# Patient Record
Sex: Male | Born: 1950 | Race: White | Hispanic: No | Marital: Married | Smoking: Former smoker
Health system: Southern US, Community
[De-identification: ages and names within clinical notes are randomized; demographics above are authoritative.]

## PROBLEM LIST (undated history)

## (undated) DIAGNOSIS — F419 Anxiety disorder, unspecified: Secondary | ICD-10-CM

## (undated) DIAGNOSIS — I739 Peripheral vascular disease, unspecified: Secondary | ICD-10-CM

## (undated) DIAGNOSIS — I10 Essential (primary) hypertension: Secondary | ICD-10-CM

## (undated) DIAGNOSIS — E785 Hyperlipidemia, unspecified: Secondary | ICD-10-CM

## (undated) DIAGNOSIS — G629 Polyneuropathy, unspecified: Secondary | ICD-10-CM

## (undated) DIAGNOSIS — K429 Umbilical hernia without obstruction or gangrene: Secondary | ICD-10-CM

## (undated) DIAGNOSIS — M1990 Unspecified osteoarthritis, unspecified site: Secondary | ICD-10-CM

## (undated) DIAGNOSIS — C61 Malignant neoplasm of prostate: Secondary | ICD-10-CM

## (undated) DIAGNOSIS — R51 Headache: Secondary | ICD-10-CM

## (undated) DIAGNOSIS — D8689 Sarcoidosis of other sites: Secondary | ICD-10-CM

## (undated) DIAGNOSIS — K5790 Diverticulosis of intestine, part unspecified, without perforation or abscess without bleeding: Secondary | ICD-10-CM

## (undated) DIAGNOSIS — R519 Headache, unspecified: Secondary | ICD-10-CM

## (undated) DIAGNOSIS — C4490 Unspecified malignant neoplasm of skin, unspecified: Secondary | ICD-10-CM

## (undated) HISTORY — PX: APPENDECTOMY: SHX54

## (undated) HISTORY — PX: KNEE CARTILAGE SURGERY: SHX688

## (undated) HISTORY — PX: CATARACT EXTRACTION: SUR2

## (undated) HISTORY — DX: Polyneuropathy, unspecified: G62.9

## (undated) HISTORY — DX: Hyperlipidemia, unspecified: E78.5

---

## 2003-05-11 ENCOUNTER — Emergency Department (HOSPITAL_COMMUNITY): Admission: EM | Admit: 2003-05-11 | Discharge: 2003-05-11 | Payer: Self-pay | Admitting: Emergency Medicine

## 2003-09-01 ENCOUNTER — Ambulatory Visit (HOSPITAL_COMMUNITY): Admission: RE | Admit: 2003-09-01 | Discharge: 2003-09-01 | Payer: Self-pay | Admitting: Gastroenterology

## 2010-03-14 DIAGNOSIS — R200 Anesthesia of skin: Secondary | ICD-10-CM | POA: Insufficient documentation

## 2010-03-14 DIAGNOSIS — E785 Hyperlipidemia, unspecified: Secondary | ICD-10-CM | POA: Insufficient documentation

## 2010-03-14 DIAGNOSIS — R4586 Emotional lability: Secondary | ICD-10-CM | POA: Insufficient documentation

## 2010-03-14 DIAGNOSIS — Z09 Encounter for follow-up examination after completed treatment for conditions other than malignant neoplasm: Secondary | ICD-10-CM | POA: Insufficient documentation

## 2010-03-14 DIAGNOSIS — M25569 Pain in unspecified knee: Secondary | ICD-10-CM | POA: Insufficient documentation

## 2010-06-14 DIAGNOSIS — E1149 Type 2 diabetes mellitus with other diabetic neurological complication: Secondary | ICD-10-CM | POA: Insufficient documentation

## 2010-06-14 DIAGNOSIS — M545 Low back pain, unspecified: Secondary | ICD-10-CM | POA: Insufficient documentation

## 2010-06-14 DIAGNOSIS — I10 Essential (primary) hypertension: Secondary | ICD-10-CM | POA: Insufficient documentation

## 2010-06-14 DIAGNOSIS — G8929 Other chronic pain: Secondary | ICD-10-CM | POA: Insufficient documentation

## 2010-10-15 DIAGNOSIS — E669 Obesity, unspecified: Secondary | ICD-10-CM | POA: Insufficient documentation

## 2011-01-10 NOTE — H&P (Signed)
NAMECEDERIC, MOZLEY NO.:  [id number]  MEDICAL RECORD NO.:  [id number]  LOCATION:  PERIO                        FACILITY:  [facility]  PHYSICIAN:  Georges Lynch. Gioffre, M.D.DATE OF BIRTH:  09-02-1950  DATE OF ADMISSION:  11/28/2010 DATE OF DISCHARGE:                             HISTORY & PHYSICAL   CHIEF COMPLAINT:  Right knee pain.  HISTORY OF PRESENT ILLNESS:  The patient is a 60 year old male with worsening right knee pain secondary to end-stage osteoarthritis.  The patient elected to have a right total knee arthroplasty by Dr. Ranee Gosselin to decrease pain and increase function.  PAST MEDICAL HISTORY: 1. Hypertension. 2. Diabetes. 3. Osteoarthritis.  FAMILY MEDICAL HISTORY:  Negative.  SOCIAL HISTORY:  Patient of Dr. Jarold Motto.  Does drink alcohol, but does not smoke.  DRUG ALLERGIES:  None.  CURRENT MEDICATIONS: 1. Glimepiride 2 mg daily. 2. Celebrex 200 mg daily. 3. Lisinopril 5 mg daily. 4. Januvia 100 mg daily. 5. Propranolol 10 mg b.i.d. 6. Gabapentin 300 mg b.i.d. 7. Mobic 15 mg daily. 8. Metformin HCL 500 mg daily. 9. B12 vitamin 1000 mg daily. 10.Vitamin C. 11.Centrum Silver. 12.Multivitamins daily. 13.Aspirin 81 mg daily.  REVIEW OF SYSTEMS:  Showed pain range of motion of bilateral knees, right greater than left.  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  PHYSICAL EXAMINATION:  VITAL SIGNS:  Pulse 74, respirations 16, blood pressure 138/86. GENERAL:  The patient is a healthy-appearing 60 year old male.  No acute distress.  Pleasant mood and affect, alert and oriented x3. HEAD AND NECK:  Shows cranial nerves II through XII grossly intact. Neck shows full range of motion without any  tenderness. CHEST:  Active breath sounds bilaterally.  No wheezes, rhonchi, or rales. HEART:  Regular rate and rhythm.  No murmur. ABDOMEN:  Nontender, nondistended. EXTREMITIES:  Moderate tenderness of bilateral knees, moderate  crepitus in medial joint line.  No rashes.  No edema.  Does have a normal heel- toe gait.  Decreased sensation at the soles of each feet.  X-rays show end-stage osteoarthritis of his right knee.  IMPRESSION:  End-stage osteoarthritis, right knee.  PLAN OF ACTION:  Right total knee arthroplasty by Dr. Ranee Gosselin.     Augustine Brannick B. Merin Borjon, P.A.   ______________________________ Georges Lynch Darrelyn Hillock, M.D.    TBD/MEDQ  D:  01/10/2011  T:  01/10/2011  Job:  [id number]

## 2011-01-16 ENCOUNTER — Encounter (HOSPITAL_COMMUNITY): Payer: Self-pay | Admitting: Pharmacy Technician

## 2011-01-23 DIAGNOSIS — E1151 Type 2 diabetes mellitus with diabetic peripheral angiopathy without gangrene: Secondary | ICD-10-CM | POA: Insufficient documentation

## 2011-01-23 DIAGNOSIS — I739 Peripheral vascular disease, unspecified: Secondary | ICD-10-CM | POA: Insufficient documentation

## 2011-01-25 ENCOUNTER — Encounter (HOSPITAL_COMMUNITY): Payer: Self-pay

## 2011-01-25 ENCOUNTER — Ambulatory Visit (HOSPITAL_COMMUNITY)
Admission: RE | Admit: 2011-01-25 | Discharge: 2011-01-25 | Disposition: A | Discharge status: Home / Self Care | Source: Ambulatory Visit | Attending: Orthopedic Surgery | Admitting: Orthopedic Surgery

## 2011-01-25 ENCOUNTER — Other Ambulatory Visit: Payer: Self-pay

## 2011-01-25 ENCOUNTER — Ambulatory Visit (INDEPENDENT_AMBULATORY_CARE_PROVIDER_SITE_OTHER): Admitting: *Deleted

## 2011-01-25 ENCOUNTER — Encounter (HOSPITAL_COMMUNITY)
Admission: RE | Admit: 2011-01-25 | Discharge: 2011-01-25 | Disposition: A | Discharge status: Home / Self Care | Source: Ambulatory Visit | Attending: Orthopedic Surgery | Admitting: Orthopedic Surgery

## 2011-01-25 DIAGNOSIS — Z01812 Encounter for preprocedural laboratory examination: Secondary | ICD-10-CM | POA: Insufficient documentation

## 2011-01-25 DIAGNOSIS — M1710 Unilateral primary osteoarthritis, unspecified knee: Secondary | ICD-10-CM | POA: Insufficient documentation

## 2011-01-25 DIAGNOSIS — I739 Peripheral vascular disease, unspecified: Secondary | ICD-10-CM

## 2011-01-25 DIAGNOSIS — Z01810 Encounter for preprocedural cardiovascular examination: Secondary | ICD-10-CM | POA: Insufficient documentation

## 2011-01-25 DIAGNOSIS — Z01818 Encounter for other preprocedural examination: Secondary | ICD-10-CM | POA: Insufficient documentation

## 2011-01-25 HISTORY — DX: Essential (primary) hypertension: I10

## 2011-01-25 HISTORY — DX: Unspecified osteoarthritis, unspecified site: M19.90

## 2011-01-25 HISTORY — DX: Anxiety disorder, unspecified: F41.9

## 2011-01-25 LAB — PROTIME-INR
INR: 1.03 (ref 0.00–1.49)
Prothrombin Time: 13.7 seconds (ref 11.6–15.2)

## 2011-01-25 LAB — CBC
HCT: 42.5 % (ref 36.0–46.0)
Hemoglobin: 15.1 g/dL — ABNORMAL HIGH (ref 12.0–15.0)
MCH: 34.5 pg — ABNORMAL HIGH (ref 26.0–34.0)
MCHC: 35.5 g/dL (ref 30.0–36.0)
MCV: 97 fL (ref 78.0–100.0)
Platelets: 197 10*3/uL (ref 150–400)
RBC: 4.38 MIL/uL (ref 3.87–5.11)
RDW: 13.2 % (ref 11.5–15.5)
WBC: 5.8 10*3/uL (ref 4.0–10.5)

## 2011-01-25 LAB — SURGICAL PCR SCREEN
MRSA, PCR: NEGATIVE
Staphylococcus aureus: NEGATIVE

## 2011-01-25 LAB — DIFFERENTIAL
Basophils Absolute: 0 10*3/uL (ref 0.0–0.1)
Basophils Relative: 1 % (ref 0–1)
Eosinophils Absolute: 0.1 10*3/uL (ref 0.0–0.7)
Eosinophils Relative: 2 % (ref 0–5)
Lymphocytes Relative: 26 % (ref 12–46)
Lymphs Abs: 1.5 10*3/uL (ref 0.7–4.0)
Monocytes Absolute: 0.7 10*3/uL (ref 0.1–1.0)
Monocytes Relative: 12 % (ref 3–12)
Neutro Abs: 3.4 10*3/uL (ref 1.7–7.7)
Neutrophils Relative %: 59 % (ref 43–77)

## 2011-01-25 LAB — URINALYSIS, ROUTINE W REFLEX MICROSCOPIC
Bilirubin Urine: NEGATIVE
Glucose, UA: NEGATIVE mg/dL
Hgb urine dipstick: NEGATIVE
Ketones, ur: NEGATIVE mg/dL
Leukocytes, UA: NEGATIVE
Nitrite: NEGATIVE
Protein, ur: NEGATIVE mg/dL
Specific Gravity, Urine: 1.026 (ref 1.005–1.030)
Urobilinogen, UA: 0.2 mg/dL (ref 0.0–1.0)
pH: 6 (ref 5.0–8.0)

## 2011-01-25 LAB — COMPREHENSIVE METABOLIC PANEL
ALT: 57 U/L — ABNORMAL HIGH (ref 0–35)
AST: 39 U/L — ABNORMAL HIGH (ref 0–37)
Albumin: 4 g/dL (ref 3.5–5.2)
Alkaline Phosphatase: 69 U/L (ref 39–117)
BUN: 19 mg/dL (ref 6–23)
CO2: 29 mEq/L (ref 19–32)
Calcium: 9.9 mg/dL (ref 8.4–10.5)
Chloride: 101 mEq/L (ref 96–112)
Creatinine, Ser: 0.84 mg/dL (ref 0.50–1.10)
GFR calc Af Amer: 86 mL/min — ABNORMAL LOW (ref >90)
GFR calc non Af Amer: 74 mL/min — ABNORMAL LOW (ref >90)
Glucose, Bld: 173 mg/dL — ABNORMAL HIGH (ref 70–99)
Potassium: 4.9 mEq/L (ref 3.5–5.1)
Sodium: 137 mEq/L (ref 135–145)
Total Bilirubin: 0.5 mg/dL (ref 0.3–1.2)
Total Protein: 7.8 g/dL (ref 6.0–8.3)

## 2011-01-25 LAB — APTT: aPTT: 35 seconds (ref 24–37)

## 2011-01-25 NOTE — Patient Instructions (Signed)
20 Raymond Herring  01/25/2011   Your procedure is scheduled on: Wed. 01/30/2011  Report to [facility] at 0630 AM.  Call this number if you have problems the morning of surgery: [phone]   Remember:   Do not eat food:After Midnight.  May have clear liquids:until Midnight .  Clear liquids include soda, tea, black coffee, apple or grape juice, broth.  Take these medicines the morning of surgery with A SIP OF WATER: Neurontin,Inderal   Do not wear jewelry, make-up or nail polish.  Do not wear lotions, powders, or perfumes. (women only)  Do not shave 48 hours prior to surgery.  Do not bring valuables to the hospital.  Contacts, dentures or bridgework may not be worn into surgery.  Leave suitcase in the car. After surgery it may be brought to your room.  For patients admitted to the hospital, checkout time is 11:00 AM the day of discharge.   Patients discharged the day of surgery will not be allowed to drive home.  Name and phone number of your driver:   Special Instructions: CHG Shower Use Special Wash: 1/2 bottle night before surgery and 1/2 bottle morning of surgery.   Please read over the following fact sheets that you were given: MRSA Information

## 2011-01-30 ENCOUNTER — Inpatient Hospital Stay (HOSPITAL_COMMUNITY)
Admission: RE | Admit: 2011-01-30 | Discharge: 2011-02-03 | DRG: 470 | Disposition: A | Discharge status: Home Health | Source: Ambulatory Visit | Attending: Orthopedic Surgery | Admitting: Orthopedic Surgery

## 2011-01-30 ENCOUNTER — Encounter (HOSPITAL_COMMUNITY): Payer: Self-pay

## 2011-01-30 ENCOUNTER — Inpatient Hospital Stay (HOSPITAL_COMMUNITY)

## 2011-01-30 ENCOUNTER — Encounter (HOSPITAL_COMMUNITY): Payer: Self-pay | Admitting: Anesthesiology

## 2011-01-30 ENCOUNTER — Encounter (HOSPITAL_COMMUNITY)
Admission: RE | Disposition: A | Discharge status: Home Health | Payer: Self-pay | Source: Ambulatory Visit | Attending: Orthopedic Surgery

## 2011-01-30 ENCOUNTER — Inpatient Hospital Stay (HOSPITAL_COMMUNITY): Admitting: Anesthesiology

## 2011-01-30 DIAGNOSIS — D649 Anemia, unspecified: Secondary | ICD-10-CM | POA: Diagnosis not present

## 2011-01-30 DIAGNOSIS — F411 Generalized anxiety disorder: Secondary | ICD-10-CM | POA: Diagnosis present

## 2011-01-30 DIAGNOSIS — M1711 Unilateral primary osteoarthritis, right knee: Secondary | ICD-10-CM | POA: Diagnosis present

## 2011-01-30 DIAGNOSIS — I10 Essential (primary) hypertension: Secondary | ICD-10-CM | POA: Diagnosis present

## 2011-01-30 DIAGNOSIS — E119 Type 2 diabetes mellitus without complications: Secondary | ICD-10-CM | POA: Diagnosis present

## 2011-01-30 DIAGNOSIS — M179 Osteoarthritis of knee, unspecified: Secondary | ICD-10-CM | POA: Diagnosis present

## 2011-01-30 DIAGNOSIS — M1710 Unilateral primary osteoarthritis, unspecified knee: Principal | ICD-10-CM | POA: Diagnosis present

## 2011-01-30 DIAGNOSIS — M24569 Contracture, unspecified knee: Secondary | ICD-10-CM | POA: Diagnosis present

## 2011-01-30 HISTORY — PX: TOTAL KNEE ARTHROPLASTY: SHX125

## 2011-01-30 LAB — TYPE AND SCREEN
ABO/RH(D): B POS
Antibody Screen: NEGATIVE
Unit division: 0
Unit division: 0

## 2011-01-30 LAB — GLUCOSE, CAPILLARY
Glucose-Capillary: 163 mg/dL — ABNORMAL HIGH (ref 70–99)
Glucose-Capillary: 123 mg/dL — ABNORMAL HIGH (ref 70–99)
Glucose-Capillary: 174 mg/dL — ABNORMAL HIGH (ref 70–99)
Glucose-Capillary: 184 mg/dL — ABNORMAL HIGH (ref 70–99)

## 2011-01-30 LAB — ABO/RH: ABO/RH(D): B POS

## 2011-01-30 SURGERY — ARTHROPLASTY, KNEE, TOTAL
Anesthesia: General | Site: Knee | Laterality: Right | Wound class: Clean

## 2011-01-30 MED ORDER — RIVAROXABAN 10 MG PO TABS
10.0000 mg | ORAL_TABLET | Freq: Every day | ORAL | Status: DC
  Administered 2011-01-31 – 2011-02-03 (×4): 10 mg via ORAL
  Filled 2011-01-30 (×4): qty 1

## 2011-01-30 MED ORDER — MENTHOL 3 MG MT LOZG: 1.0000 | LOZENGE | OROMUCOSAL | Status: DC | PRN

## 2011-01-30 MED ORDER — PROPRANOLOL HCL 10 MG PO TABS
10.0000 mg | ORAL_TABLET | Freq: Two times a day (BID) | ORAL | Status: DC
Start: 2011-01-30 — End: 2011-02-03
  Administered 2011-01-30 – 2011-02-03 (×8): 10 mg via ORAL
  Filled 2011-01-30 (×9): qty 1

## 2011-01-30 MED ORDER — HYDROMORPHONE HCL PF 1 MG/ML IJ SOLN
INTRAMUSCULAR | Status: AC
  Administered 2011-01-30: 1 mg via INTRAVENOUS
  Filled 2011-01-30: qty 1

## 2011-01-30 MED ORDER — METFORMIN HCL ER 500 MG PO TB24
500.0000 mg | ORAL_TABLET | Freq: Every day | ORAL | Status: DC
  Administered 2011-01-30 – 2011-02-02 (×4): 500 mg via ORAL
  Filled 2011-01-30 (×5): qty 1

## 2011-01-30 MED ORDER — HYDRALAZINE HCL 20 MG/ML IJ SOLN
INTRAMUSCULAR | Status: AC
  Administered 2011-01-30: 2.5 mg via INTRAVENOUS
  Filled 2011-01-30: qty 1

## 2011-01-30 MED ORDER — HYDROMORPHONE HCL PF 1 MG/ML IJ SOLN
0.5000 mg | INTRAMUSCULAR | Status: DC | PRN
  Administered 2011-01-30 – 2011-02-01 (×5): 1 mg via INTRAVENOUS
  Filled 2011-01-30 (×6): qty 1

## 2011-01-30 MED ORDER — FERROUS SULFATE 325 (65 FE) MG PO TABS
325.0000 mg | ORAL_TABLET | Freq: Three times a day (TID) | ORAL | Status: DC
  Administered 2011-01-30 – 2011-02-03 (×11): 325 mg via ORAL
  Filled 2011-01-30 (×13): qty 1

## 2011-01-30 MED ORDER — BUPIVACAINE LIPOSOME 1.3 % IJ SUSP
INTRAMUSCULAR | Status: DC | PRN
  Administered 2011-01-30: 20 mL

## 2011-01-30 MED ORDER — CEFAZOLIN SODIUM-DEXTROSE 2-3 GM-% IV SOLR
INTRAVENOUS | Status: AC
  Filled 2011-01-30: qty 50

## 2011-01-30 MED ORDER — FLEET ENEMA 7-19 GM/118ML RE ENEM: 1.0000 | ENEMA | Freq: Once | RECTAL | Status: AC | PRN

## 2011-01-30 MED ORDER — PROMETHAZINE HCL 25 MG/ML IJ SOLN: 6.2500 mg | INTRAMUSCULAR | Status: DC | PRN

## 2011-01-30 MED ORDER — BUPIVACAINE LIPOSOME 1.3 % IJ SUSP
20.0000 mL | Freq: Once | INTRAMUSCULAR | Status: DC
  Filled 2011-01-30: qty 20

## 2011-01-30 MED ORDER — LIDOCAINE HCL (CARDIAC) 20 MG/ML IV SOLN
INTRAVENOUS | Status: DC | PRN
  Administered 2011-01-30: 80 mg via INTRAVENOUS

## 2011-01-30 MED ORDER — POLYETHYLENE GLYCOL 3350 17 G PO PACK
17.0000 g | PACK | Freq: Every day | ORAL | Status: DC | PRN
  Filled 2011-01-30: qty 1

## 2011-01-30 MED ORDER — THIAMINE HCL 100 MG/ML IJ SOLN
100.0000 mg | Freq: Every day | INTRAMUSCULAR | Status: DC
  Filled 2011-01-30 (×4): qty 1

## 2011-01-30 MED ORDER — LISINOPRIL 5 MG PO TABS
5.0000 mg | ORAL_TABLET | Freq: Every day | ORAL | Status: DC
  Administered 2011-01-31 – 2011-02-03 (×4): 5 mg via ORAL
  Filled 2011-01-30 (×4): qty 1

## 2011-01-30 MED ORDER — BISACODYL 10 MG RE SUPP: 10.0000 mg | Freq: Every day | RECTAL | Status: DC | PRN

## 2011-01-30 MED ORDER — LACTATED RINGERS IV SOLN
INTRAVENOUS | Status: DC
  Administered 2011-01-30 (×4): via INTRAVENOUS

## 2011-01-30 MED ORDER — HYDROMORPHONE HCL PF 1 MG/ML IJ SOLN
INTRAMUSCULAR | Status: DC | PRN
  Administered 2011-01-30 (×4): 1 mg via INTRAVENOUS

## 2011-01-30 MED ORDER — HYDRALAZINE HCL 20 MG/ML IJ SOLN
2.5000 mg | Freq: Once | INTRAMUSCULAR | Status: AC
  Administered 2011-01-30: 2.5 mg via INTRAVENOUS

## 2011-01-30 MED ORDER — SUFENTANIL CITRATE 50 MCG/ML IV SOLN
INTRAVENOUS | Status: DC | PRN
  Administered 2011-01-30 (×2): 10 ug via INTRAVENOUS
  Administered 2011-01-30 (×3): 5 ug via INTRAVENOUS
  Administered 2011-01-30: 10 ug via INTRAVENOUS

## 2011-01-30 MED ORDER — LACTATED RINGERS IV SOLN: INTRAVENOUS | Status: DC

## 2011-01-30 MED ORDER — METHOCARBAMOL 100 MG/ML IJ SOLN
500.0000 mg | Freq: Four times a day (QID) | INTRAVENOUS | Status: DC | PRN
  Administered 2011-01-30: 500 mg via INTRAVENOUS
  Filled 2011-01-30: qty 5

## 2011-01-30 MED ORDER — GABAPENTIN 300 MG PO CAPS
300.0000 mg | ORAL_CAPSULE | Freq: Two times a day (BID) | ORAL | Status: DC
  Administered 2011-01-30 – 2011-02-03 (×8): 300 mg via ORAL
  Filled 2011-01-30 (×9): qty 1

## 2011-01-30 MED ORDER — MEPERIDINE HCL 50 MG/ML IJ SOLN: 6.2500 mg | INTRAMUSCULAR | Status: DC | PRN

## 2011-01-30 MED ORDER — OXYCODONE-ACETAMINOPHEN 5-325 MG PO TABS
1.0000 | ORAL_TABLET | ORAL | Status: DC | PRN
  Administered 2011-01-30: 1 via ORAL
  Administered 2011-01-31 – 2011-02-01 (×6): 2 via ORAL
  Administered 2011-02-01: 1 via ORAL
  Administered 2011-02-01: 2 via ORAL
  Administered 2011-02-02: 1 via ORAL
  Administered 2011-02-02 (×2): 2 via ORAL
  Administered 2011-02-02: 1 via ORAL
  Administered 2011-02-03 (×2): 2 via ORAL
  Filled 2011-01-30 (×9): qty 2
  Filled 2011-01-30 (×3): qty 1
  Filled 2011-01-30 (×3): qty 2

## 2011-01-30 MED ORDER — INSULIN ASPART 100 UNIT/ML ~~LOC~~ SOLN
SUBCUTANEOUS | Status: AC
  Administered 2011-01-30: 2 [IU] via SUBCUTANEOUS
  Filled 2011-01-30: qty 3

## 2011-01-30 MED ORDER — LINAGLIPTIN 5 MG PO TABS
5.0000 mg | ORAL_TABLET | Freq: Every day | ORAL | Status: DC
  Administered 2011-01-30 – 2011-02-03 (×5): 5 mg via ORAL
  Filled 2011-01-30 (×5): qty 1

## 2011-01-30 MED ORDER — FOLIC ACID 1 MG PO TABS
1.0000 mg | ORAL_TABLET | Freq: Every day | ORAL | Status: DC
  Administered 2011-01-31 – 2011-02-03 (×4): 1 mg via ORAL
  Filled 2011-01-30 (×4): qty 1

## 2011-01-30 MED ORDER — PHENYLEPHRINE HCL 10 MG/ML IJ SOLN
INTRAMUSCULAR | Status: DC | PRN
  Administered 2011-01-30 (×6): 80 ug via INTRAVENOUS

## 2011-01-30 MED ORDER — METHOCARBAMOL 500 MG PO TABS
500.0000 mg | ORAL_TABLET | Freq: Four times a day (QID) | ORAL | Status: DC | PRN
  Administered 2011-01-30 – 2011-02-03 (×10): 500 mg via ORAL
  Filled 2011-01-30 (×10): qty 1

## 2011-01-30 MED ORDER — INSULIN ASPART 100 UNIT/ML ~~LOC~~ SOLN
0.0000 [IU] | SUBCUTANEOUS | Status: DC
  Administered 2011-01-30: 2 [IU] via SUBCUTANEOUS

## 2011-01-30 MED ORDER — LORAZEPAM 1 MG PO TABS
1.0000 mg | ORAL_TABLET | Freq: Four times a day (QID) | ORAL | Status: AC | PRN
  Administered 2011-01-31 – 2011-02-02 (×3): 1 mg via ORAL
  Filled 2011-01-30 (×4): qty 1

## 2011-01-30 MED ORDER — LACTATED RINGERS IV SOLN: INTRAVENOUS | Status: DC | PRN

## 2011-01-30 MED ORDER — SODIUM CHLORIDE 0.9 % IR SOLN
Status: DC | PRN
  Administered 2011-01-30: 09:00:00

## 2011-01-30 MED ORDER — ALUM & MAG HYDROXIDE-SIMETH 200-200-20 MG/5ML PO SUSP: 30.0000 mL | ORAL | Status: DC | PRN

## 2011-01-30 MED ORDER — PHENOL 1.4 % MT LIQD: 1.0000 | OROMUCOSAL | Status: DC | PRN

## 2011-01-30 MED ORDER — SODIUM CHLORIDE 0.9 % IR SOLN
Status: DC | PRN
  Administered 2011-01-30: 3000 mL

## 2011-01-30 MED ORDER — HETASTARCH-ELECTROLYTES 6 % IV SOLN
INTRAVENOUS | Status: DC | PRN
  Administered 2011-01-30 (×2): via INTRAVENOUS

## 2011-01-30 MED ORDER — ONDANSETRON HCL 4 MG PO TABS: 4.0000 mg | ORAL_TABLET | Freq: Four times a day (QID) | ORAL | Status: DC | PRN

## 2011-01-30 MED ORDER — ONDANSETRON HCL 4 MG/2ML IJ SOLN
INTRAMUSCULAR | Status: DC | PRN
  Administered 2011-01-30: 4 mg via INTRAVENOUS

## 2011-01-30 MED ORDER — THROMBIN 5000 UNITS EX SOLR
CUTANEOUS | Status: DC | PRN
  Administered 2011-01-30: 10000 [IU] via TOPICAL

## 2011-01-30 MED ORDER — INSULIN ASPART 100 UNIT/ML ~~LOC~~ SOLN
0.0000 [IU] | Freq: Three times a day (TID) | SUBCUTANEOUS | Status: DC
  Administered 2011-01-30: 3 [IU] via SUBCUTANEOUS
  Administered 2011-01-31: 5 [IU] via SUBCUTANEOUS
  Administered 2011-01-31 – 2011-02-01 (×4): 3 [IU] via SUBCUTANEOUS
  Administered 2011-02-01: 5 [IU] via SUBCUTANEOUS
  Administered 2011-02-02 (×2): 3 [IU] via SUBCUTANEOUS
  Administered 2011-02-02: 2 [IU] via SUBCUTANEOUS
  Administered 2011-02-03: 3 [IU] via SUBCUTANEOUS
  Filled 2011-01-30: qty 3

## 2011-01-30 MED ORDER — SUCCINYLCHOLINE CHLORIDE 20 MG/ML IJ SOLN
INTRAMUSCULAR | Status: DC | PRN
  Administered 2011-01-30: 100 mg via INTRAVENOUS

## 2011-01-30 MED ORDER — PROPOFOL 10 MG/ML IV EMUL
INTRAVENOUS | Status: DC | PRN
  Administered 2011-01-30: 200 mg via INTRAVENOUS

## 2011-01-30 MED ORDER — LACTATED RINGERS IV SOLN
INTRAVENOUS | Status: DC
  Administered 2011-01-30: 22:00:00 via INTRAVENOUS

## 2011-01-30 MED ORDER — CEFAZOLIN SODIUM-DEXTROSE 2-3 GM-% IV SOLR
2.0000 g | Freq: Once | INTRAVENOUS | Status: AC
  Administered 2011-01-30: 2 g via INTRAVENOUS
  Filled 2011-01-30: qty 50

## 2011-01-30 MED ORDER — ACETAMINOPHEN 10 MG/ML IV SOLN
INTRAVENOUS | Status: AC
  Filled 2011-01-30: qty 100

## 2011-01-30 MED ORDER — MIDAZOLAM HCL 5 MG/5ML IJ SOLN
INTRAMUSCULAR | Status: DC | PRN
  Administered 2011-01-30: 2 mg via INTRAVENOUS

## 2011-01-30 MED ORDER — CEFAZOLIN SODIUM 1-5 GM-% IV SOLN
1.0000 g | Freq: Four times a day (QID) | INTRAVENOUS | Status: AC
  Administered 2011-01-30 – 2011-01-31 (×3): 1 g via INTRAVENOUS
  Filled 2011-01-30 (×4): qty 50

## 2011-01-30 MED ORDER — VITAMIN B-1 100 MG PO TABS
100.0000 mg | ORAL_TABLET | Freq: Every day | ORAL | Status: DC
  Administered 2011-01-31 – 2011-02-03 (×4): 100 mg via ORAL
  Filled 2011-01-30 (×4): qty 1

## 2011-01-30 MED ORDER — HYDROCODONE-ACETAMINOPHEN 10-325 MG PO TABS
1.0000 | ORAL_TABLET | ORAL | Status: DC | PRN
  Administered 2011-01-31 (×2): 2 via ORAL
  Filled 2011-01-30 (×2): qty 2

## 2011-01-30 MED ORDER — HYDROMORPHONE HCL PF 1 MG/ML IJ SOLN
0.2500 mg | INTRAMUSCULAR | Status: DC | PRN
Start: 2011-01-30 — End: 2011-01-30
  Administered 2011-01-30 (×2): 0.5 mg via INTRAVENOUS
  Administered 2011-01-30: 0.25 mg via INTRAVENOUS

## 2011-01-30 MED ORDER — ADULT MULTIVITAMIN W/MINERALS CH
1.0000 | ORAL_TABLET | Freq: Every day | ORAL | Status: DC
  Administered 2011-01-31 – 2011-02-03 (×4): 1 via ORAL
  Filled 2011-01-30 (×4): qty 1

## 2011-01-30 MED ORDER — ONDANSETRON HCL 4 MG/2ML IJ SOLN
4.0000 mg | Freq: Four times a day (QID) | INTRAMUSCULAR | Status: DC | PRN
  Administered 2011-01-30: 4 mg via INTRAVENOUS
  Filled 2011-01-30: qty 2

## 2011-01-30 MED ORDER — LORAZEPAM 2 MG/ML IJ SOLN: 1.0000 mg | Freq: Four times a day (QID) | INTRAMUSCULAR | Status: AC | PRN

## 2011-01-30 MED ORDER — THROMBIN 5000 UNITS EX SOLR
CUTANEOUS | Status: AC
  Filled 2011-01-30: qty 10000

## 2011-01-30 SURGICAL SUPPLY — 59 items
BAG ZIPLOCK 12X15 (MISCELLANEOUS) ×2 IMPLANT
BANDAGE ELASTIC 6 VELCRO ST LF (GAUZE/BANDAGES/DRESSINGS) ×4 IMPLANT
BANDAGE ESMARK 6X9 LF (GAUZE/BANDAGES/DRESSINGS) ×1 IMPLANT
BANDAGE GAUZE ELAST BULKY 4 IN (GAUZE/BANDAGES/DRESSINGS) ×2 IMPLANT
BLADE SAG 18X100X1.27 (BLADE) ×2 IMPLANT
BLADE SAW SGTL 13.0X1.19X90.0M (BLADE) ×2 IMPLANT
BNDG ESMARK 6X9 LF (GAUZE/BANDAGES/DRESSINGS) ×2
BONE CEMENT GENTAMICIN (Cement) ×2 IMPLANT
CEMENT BONE GENTAMICIN 40 (Cement) ×1 IMPLANT
CLOSURE STERI STRIP 1/2 X4 (GAUZE/BANDAGES/DRESSINGS) IMPLANT
CLOTH BEACON ORANGE TIMEOUT ST (SAFETY) ×2 IMPLANT
CUFF TOURN SGL QUICK 34 (TOURNIQUET CUFF) ×1
CUFF TRNQT CYL 34X4X40X1 (TOURNIQUET CUFF) ×1 IMPLANT
DRAPE EXTREMITY T 121X128X90 (DRAPE) ×2 IMPLANT
DRAPE INCISE IOBAN 66X45 STRL (DRAPES) ×2 IMPLANT
DRAPE LG THREE QUARTER DISP (DRAPES) ×4 IMPLANT
DRAPE POUCH INSTRU U-SHP 10X18 (DRAPES) ×2 IMPLANT
DRAPE U-SHAPE 47X51 STRL (DRAPES) ×2 IMPLANT
DRSG ADAPTIC 3X8 NADH LF (GAUZE/BANDAGES/DRESSINGS) ×2 IMPLANT
DRSG PAD ABDOMINAL 8X10 ST (GAUZE/BANDAGES/DRESSINGS) ×2 IMPLANT
DURAPREP 26ML APPLICATOR (WOUND CARE) ×2 IMPLANT
ELECT BLADE TIP CTD 4 INCH (ELECTRODE) ×2 IMPLANT
ELECT REM PT RETURN 9FT ADLT (ELECTROSURGICAL) ×2
ELECTRODE REM PT RTRN 9FT ADLT (ELECTROSURGICAL) ×1 IMPLANT
EVACUATOR 1/8 PVC DRAIN (DRAIN) ×2 IMPLANT
FACESHIELD LNG OPTICON STERILE (SAFETY) ×10 IMPLANT
GAUZE SPONGE 4X4 12PLY STRL LF (GAUZE/BANDAGES/DRESSINGS) ×2 IMPLANT
GLOVE BIOGEL PI IND STRL 8.5 (GLOVE) ×1 IMPLANT
GLOVE BIOGEL PI INDICATOR 8.5 (GLOVE) ×1
GLOVE ECLIPSE 8.0 STRL XLNG CF (GLOVE) ×2 IMPLANT
GOWN PREVENTION PLUS LG XLONG (DISPOSABLE) ×4 IMPLANT
GOWN STRL REIN XL XLG (GOWN DISPOSABLE) ×2 IMPLANT
HANDPIECE INTERPULSE COAX TIP (DISPOSABLE) ×1
IMMOBILIZER KNEE 22 UNIV (SOFTGOODS) ×2 IMPLANT
KIT BASIN OR (CUSTOM PROCEDURE TRAY) ×2 IMPLANT
MANIFOLD NEPTUNE II (INSTRUMENTS) ×2 IMPLANT
NEEDLE HYPO 22GX1.5 SAFETY (NEEDLE) ×2 IMPLANT
NS IRRIG 1000ML POUR BTL (IV SOLUTION) IMPLANT
PACK TOTAL JOINT (CUSTOM PROCEDURE TRAY) ×2 IMPLANT
POSITIONER SURGICAL ARM (MISCELLANEOUS) ×2 IMPLANT
SET HNDPC FAN SPRY TIP SCT (DISPOSABLE) ×1 IMPLANT
SET PAD KNEE POSITIONER (MISCELLANEOUS) ×2 IMPLANT
SPONGE LAP 18X18 X RAY DECT (DISPOSABLE) ×2 IMPLANT
SPONGE SURGIFOAM ABS GEL 100 (HEMOSTASIS) ×2 IMPLANT
STAPLER VISISTAT 35W (STAPLE) ×2 IMPLANT
SUCTION FRAZIER 12FR DISP (SUCTIONS) ×2 IMPLANT
SUT BONE WAX W31G (SUTURE) ×2 IMPLANT
SUT VIC AB 0 CT1 27 (SUTURE) ×2
SUT VIC AB 0 CT1 27XBRD ANTBC (SUTURE) ×2 IMPLANT
SUT VIC AB 1 CT1 27 (SUTURE) ×5
SUT VIC AB 1 CT1 27XBRD ANTBC (SUTURE) ×5 IMPLANT
SUT VIC AB 2-0 CT1 27 (SUTURE) ×3
SUT VIC AB 2-0 CT1 TAPERPNT 27 (SUTURE) ×3 IMPLANT
SYR 20CC LL (SYRINGE) ×2 IMPLANT
TOWEL OR 17X26 10 PK STRL BLUE (TOWEL DISPOSABLE) ×4 IMPLANT
TOWER CARTRIDGE SMART MIX (DISPOSABLE) ×2 IMPLANT
TRAY FOLEY CATH 14FRSI W/METER (CATHETERS) ×2 IMPLANT
WATER STERILE IRR 1500ML POUR (IV SOLUTION) ×2 IMPLANT
WRAP KNEE MAXI GEL POST OP (GAUZE/BANDAGES/DRESSINGS) ×2 IMPLANT

## 2011-01-30 NOTE — Anesthesia Postprocedure Evaluation (Signed)
  Anesthesia Post-op Note  Patient: Raymond Herring  Procedure(s) Performed:  TOTAL KNEE ARTHROPLASTY  Patient Location: PACU  Anesthesia Type: General  Level of Consciousness: awake and alert   Airway and Oxygen Therapy: Patient Spontanous Breathing  Post-op Pain: mild  Post-op Assessment: Post-op Vital signs reviewed, Patient's Cardiovascular Status Stable, Respiratory Function Stable, Patent Airway and No signs of Nausea or vomiting  Post-op Vital Signs: stable  Complications: No apparent anesthesia complications

## 2011-01-30 NOTE — Interval H&P Note (Signed)
History and Physical Interval Note:  01/30/2011 8:12 AM  Raymond Herring  has presented today for surgery, with the diagnosis of Osteoarthritis of the Right Knee  The various methods of treatment have been discussed with the patient and family. After consideration of risks, benefits and other options for treatment, the patient has consented to  Procedure(s): TOTAL KNEE ARTHROPLASTY as a surgical intervention .  The patients' history has been reviewed, patient examined, no change in status, stable for surgery.  I have reviewed the patients' chart and labs.  Questions were answered to the patient's satisfaction.     Maurene Hollin A

## 2011-01-30 NOTE — Brief Op Note (Signed)
01/30/2011  11:04 AM  PATIENT:  Raymond Herring  61 y.o. male  PRE-OPERATIVE DIAGNOSIS:  Osteoarthritis of the Right Knee  POST-OPERATIVE DIAGNOSIS:  Osteoarthritis of the Right Knee  PROCEDURE:  Procedure(s): TOTAL KNEE ARTHROPLASTY  SURGEON:  Surgeon(s): Lara Palinkas A Armarion Greek  PHYSICIAN ASSISTANT:   ASSISTANTS: Pharmacist, community PA   ANESTHESIA:   general  EBL:  Total I/O In: 2500 [I.V.:2000; IV Piggyback:500] Out: 550 [Urine:150; Blood:400]  BLOOD ADMINISTERED:none  DRAINS: (Hemovac Drain) Hemovact drain(s) in the Right Knee with  Suction Open   LOCAL MEDICATIONS USED:  BUPIVICAINE 20CC plus 20cc Normal Saline  SPECIMEN:  No Specimen  DISPOSITION OF SPECIMEN:  N/A  COUNTS:  YES  TOURNIQUET:   Total Tourniquet Time Documented: Thigh (Right) - 86 minutes  DICTATION: .Other Dictation: Dictation Number [id number]  PLAN OF CARE: Admit to inpatient   PATIENT DISPOSITION:  PACU - hemodynamically stable.   Delay start of Pharmacological VTE agent (>24hrs) due to surgical blood loss or risk of bleeding:  {YES/NO/NOT APPLICABLE:20182

## 2011-01-30 NOTE — Anesthesia Preprocedure Evaluation (Addendum)
Anesthesia Evaluation  Patient identified by MRN, date of birth, ID band Patient awake    Reviewed: Allergy & Precautions, H&P , NPO status , Patient's Chart, lab work & pertinent test results  Airway Mallampati: II TM Distance: >3 FB Neck ROM: Full    Dental No notable dental hx. (+) Dental Advisory Given   Pulmonary neg pulmonary ROS, former smoker clear to auscultation  Pulmonary exam normal       Cardiovascular hypertension, Pt. on medications neg cardio ROS Regular Normal    Neuro/Psych PSYCHIATRIC DISORDERS Anxiety Negative Neurological ROS  Negative Psych ROS   GI/Hepatic negative GI ROS, Neg liver ROS, AST/ALT elevated   Endo/Other  Negative Endocrine ROSDiabetes mellitus-  Renal/GU negative Renal ROS  Genitourinary negative   Musculoskeletal negative musculoskeletal ROS (+)   Abdominal   Peds negative pediatric ROS (+)  Hematology negative hematology ROS (+)   Anesthesia Other Findings 2 upper front crowns  Reproductive/Obstetrics negative OB ROS                          Anesthesia Physical Anesthesia Plan  ASA: II  Anesthesia Plan: General   Post-op Pain Management:    Induction: Intravenous  Airway Management Planned: Oral ETT  Additional Equipment:   Intra-op Plan:   Post-operative Plan: Extubation in OR  Informed Consent: I have reviewed the patients History and Physical, chart, labs and discussed the procedure including the risks, benefits and alternatives for the proposed anesthesia with the patient or authorized representative who has indicated his/her understanding and acceptance.   Dental advisory given  Plan Discussed with: CRNA  Anesthesia Plan Comments: (No IV Tylenol. AST/ALT elevated.)        Anesthesia Quick Evaluation

## 2011-01-30 NOTE — Transfer of Care (Signed)
Immediate Anesthesia Transfer of Care Note  Patient: Raymond Herring  Procedure(s) Performed:  TOTAL KNEE ARTHROPLASTY  Patient Location: PACU  Anesthesia Type: General  Level of Consciousness: awake, alert , oriented and patient cooperative  Airway & Oxygen Therapy: Patient Spontanous Breathing and Patient connected to face mask oxygen  Post-op Assessment: Report given to PACU RN, Post -op Vital signs reviewed and stable and Patient moving all extremities X 4  Post vital signs: stable Filed Vitals:   01/30/11 0616  BP: 146/84  Pulse: 82  Temp: 36.6 C  Resp: 20    Complications: No apparent anesthesia complications

## 2011-01-31 LAB — PROTIME-INR
INR: 1.21 (ref 0.00–1.49)
Prothrombin Time: 15.6 seconds — ABNORMAL HIGH (ref 11.6–15.2)

## 2011-01-31 LAB — HEPATIC FUNCTION PANEL
ALT: 32 U/L (ref 0–35)
AST: 23 U/L (ref 0–37)
Albumin: 2.9 g/dL — ABNORMAL LOW (ref 3.5–5.2)
Alkaline Phosphatase: 36 U/L — ABNORMAL LOW (ref 39–117)
Bilirubin, Direct: 0.2 mg/dL (ref 0.0–0.3)
Indirect Bilirubin: 0.5 mg/dL (ref 0.3–0.9)
Total Bilirubin: 0.7 mg/dL (ref 0.3–1.2)
Total Protein: 5.5 g/dL — ABNORMAL LOW (ref 6.0–8.3)

## 2011-01-31 LAB — CBC
HCT: 29.4 % — ABNORMAL LOW (ref 36.0–46.0)
Hemoglobin: 10.5 g/dL — ABNORMAL LOW (ref 12.0–15.0)
MCH: 34.4 pg — ABNORMAL HIGH (ref 26.0–34.0)
MCHC: 35.7 g/dL (ref 30.0–36.0)
MCV: 96.4 fL (ref 78.0–100.0)
Platelets: 139 10*3/uL — ABNORMAL LOW (ref 150–400)
RBC: 3.05 MIL/uL — ABNORMAL LOW (ref 3.87–5.11)
RDW: 12.6 % (ref 11.5–15.5)
WBC: 9.4 10*3/uL (ref 4.0–10.5)

## 2011-01-31 LAB — BASIC METABOLIC PANEL
BUN: 14 mg/dL (ref 6–23)
CO2: 28 mEq/L (ref 19–32)
Calcium: 8.2 mg/dL — ABNORMAL LOW (ref 8.4–10.5)
Chloride: 98 mEq/L (ref 96–112)
Creatinine, Ser: 0.76 mg/dL (ref 0.50–1.10)
GFR calc Af Amer: >90 mL/min (ref >90)
GFR calc non Af Amer: 90 mL/min — ABNORMAL LOW (ref >90)
Glucose, Bld: 183 mg/dL — ABNORMAL HIGH (ref 70–99)
Potassium: 3.8 mEq/L (ref 3.5–5.1)
Sodium: 133 mEq/L — ABNORMAL LOW (ref 135–145)

## 2011-01-31 LAB — GLUCOSE, CAPILLARY
Glucose-Capillary: 180 mg/dL — ABNORMAL HIGH (ref 70–99)
Glucose-Capillary: 216 mg/dL — ABNORMAL HIGH (ref 70–99)

## 2011-01-31 NOTE — Progress Notes (Signed)
Physical Therapy Treatment Patient Details Name: Raymond Herring MRN: [id number] DOB: [birth year] Today's Date: 01/31/2011 1000-1015 g PT Assessment/Plan  PT - Assessment/Plan Comments on Treatment Session: pt continues with pain issues. unable to ambulate at this time PT Plan: Discharge plan remains appropriate;Frequency remains appropriate PT Frequency: 7X/week Recommendations for Other Services: OT consult Follow Up Recommendations: Home health PT Equipment Recommended: None recommended by PT PT Goals  Acute Rehab PT Goals PT Goal Formulation: With patient Time For Goal Achievement: 7 days Pt will go Supine/Side to Sit: with supervision;with HOB 0 degrees PT Goal: Supine/Side to Sit - Progress: Goal set today Pt will go Sit to Supine/Side: with supervision PT Goal: Sit to Supine/Side - Progress: Progressing toward goal Pt will go Sit to Stand: with supervision PT Goal: Sit to Stand - Progress: Progressing toward goal Pt will go Stand to Sit: with supervision PT Goal: Stand to Sit - Progress: Progressing toward goal Pt will Ambulate: >150 feet;with supervision;with rolling walker PT Goal: Ambulate - Progress: Progressing toward goal Pt will Go Up / Down Stairs: 3-5 stairs;with min assist;with least restrictive assistive device PT Goal: Up/Down Stairs - Progress: Goal set today Pt will Perform Home Exercise Program: with supervision, verbal cues required/provided PT Goal: Perform Home Exercise Program - Progress: Goal set today  PT Treatment Precautions/Restrictions  Precautions Precautions: Knee Required Braces or Orthoses: Yes Restrictions Weight Bearing Restrictions: No Mobility (including Balance) Bed Mobility Bed Mobility: Yes Sit to Supine: 3: Mod assist Sit to Supine - Details (indicate cue type and reason): for  RLE onto bed Transfers Transfers: Yes Sit to Stand: 3: Mod assist;With upper extremity assist;With armrests;From chair/3-in-1 Sit to Stand Details  (indicate cue type and reason): vc to push from recliner Stand to Sit: 4: Min assist;To bed;With upper extremity assist Stand to Sit Details: vc to place RLE forward, reach back Ambulation/Gait Ambulation/Gait: Yes Ambulation/Gait Assistance: 3: Mod assist Ambulation/Gait Assistance Details (indicate cue type and reason): vc to place weight on RLE Ambulation Distance (Feet): 5 Feet Assistive device: Rolling walker Gait Pattern: Decreased stance time - right  Posture/Postural Control Posture/Postural Control: No significant limitations Exercise  End of Session PT - End of Session Equipment Utilized During Treatment: Right knee immobilizer Activity Tolerance: Patient limited by pain Patient left: with call bell in reach;in bed Nurse Communication: Mobility status for transfers General Behavior During Session: [facility] for tasks performed Cognition: [facility] for tasks performed  [name] 01/31/2011, 2:51 PM

## 2011-01-31 NOTE — Progress Notes (Signed)
CSW consulted for d/c planning. PN reviewed. Pt plans to return home following hospitalization. CSW is available to assist if plan changes and SNF placement is needed.

## 2011-01-31 NOTE — Progress Notes (Signed)
Physical Therapy Treatment Patient Details Name: Raymond Herring MRN: [id number] DOB: Feb 18, 1950 Today's Date: 01/31/2011  R TKR POD #2 pm session 15:25 - 16:05 1 te  1 ta  1 gt  PT Assessment/Plan  PT - Assessment/Plan Comments on Treatment Session: Pt is very motivated. "I want to get this done with so I can go hunting".  Pt plans to D/C to home. PT Plan: Discharge plan remains appropriate PT Frequency: 7X/week Recommendations for Other Services: OT consult Follow Up Recommendations: Home health PT Equipment Recommended: None recommended by PT PT Goals  Acute Rehab PT Goals PT Goal Formulation: With patient Time For Goal Achievement: 7 days Pt will go Supine/Side to Sit: with supervision PT Goal: Supine/Side to Sit - Progress: Progressing toward goal Pt will go Sit to Supine/Side: with supervision PT Goal: Sit to Supine/Side - Progress: Progressing toward goal Pt will go Sit to Stand: with supervision PT Goal: Sit to Stand - Progress: Progressing toward goal Pt will go Stand to Sit: with supervision PT Goal: Stand to Sit - Progress: Progressing toward goal Pt will Ambulate: >150 feet;with supervision;with rolling walker PT Goal: Ambulate - Progress: Progressing toward goal Pt will Go Up / Down Stairs: 3-5 stairs;with min assist;with least restrictive assistive device PT Goal: Up/Down Stairs - Progress: Not met Pt will Perform Home Exercise Program: with supervision, verbal cues required/provided PT Goal: Perform Home Exercise Program - Progress: Progressing toward goal  PT Treatment Precautions/Restrictions  Precautions Precautions: Knee Required Braces or Orthoses: Yes Restrictions Weight Bearing Restrictions: No Mobility (including Balance) Bed Mobility Bed Mobility: Yes Supine to Sit: 3: Mod assist Supine to Sit Details (indicate cue type and reason): mod assist to support R LE Sit to Supine: 3: Mod assist Sit to Supine - Details (indicate cue type and reason):  Mod assist to support R LE on bed Transfers Transfers: Yes Sit to Stand: 3: Mod assist;From bed Sit to Stand Details (indicate cue type and reason): 25% VC's on hand placement and increased time Stand to Sit: 4: Min assist;To bed Stand to Sit Details: 25% VC's on hand placement and R LE advancement prior to sit Ambulation/Gait Ambulation/Gait: Yes Ambulation/Gait Assistance: 1: +2 Total assist Ambulation/Gait Assistance Details (indicate cue type and reason): total assist + 2 pt 75% with 75% VC's on sequencing and increased time Ambulation Distance (Feet): 18 Feet Assistive device: Rolling walker Gait Pattern: Step-to pattern;Decreased stance time - right;Trunk flexed Gait velocity: amb from bed to short distance in hallway then back to bed for TE's Stairs: No Wheelchair Mobility Wheelchair Mobility: No  Posture/Postural Control Posture/Postural Control: No significant limitations Exercise  Total Joint Exercises Ankle Circles/Pumps: AROM;Both;10 reps Quad Sets: AROM;Both;10 reps Gluteal Sets: AROM;Both;10 reps Towel Squeeze: AROM;Both;10 reps Short Arc QuadBarbaraann Boys;Right;10 reps Heel Slides: AAROM;Right;10 reps Hip ABduction/ADduction: AAROM;Right;10 reps Straight Leg Raises: AAROM;Right;10 reps End of Session PT - End of Session Equipment Utilized During Treatment: Gait belt Activity Tolerance: Patient tolerated treatment well Patient left: in bed;with call bell in reach Nurse Communication: Mobility status for transfers General Behavior During Session: [facility] for tasks performed Cognition: [facility] for tasks performed  Felecia Shelling  PTA [facility]  Acute  Rehab Pager     [phone]

## 2011-01-31 NOTE — Progress Notes (Signed)
Subjective: 1 Day Post-Op Procedure(s) (LRB): TOTAL KNEE ARTHROPLASTY (Right) Patient reports pain as moderate.   Patient seen in rounds with Dr. Darrelyn Hillock. Patient has complaints of moderate pain and nausea. He reports that he was able to get some sleep last night but was awakened multiple times due to pain. She denies shortness of breath and chest pain. He is anxious to start therapy.  We will start therapy today. Plan is to go home after hospital stay.  Objective: Vital signs in last 24 hours: Temp:  [98 F (36.7 C)-99.2 F (37.3 C)] 98.6 F (37 C) (01/17 0625) Pulse Rate:  [74-100] 82  (01/17 0625) Resp:  [14-18] 16  (01/17 0625) BP: (132-173)/(74-113) 143/74 mmHg (01/17 0625) SpO2:  [98 %-100 %] 98 % (01/17 0625) Weight:  [117.935 kg (260 lb)] 117.935 kg (260 lb) (01/16 1730)  Intake/Output from previous day:  Intake/Output Summary (Last 24 hours) at 01/31/11 0721 Last data filed at 01/31/11 0625  Gross per 24 hour  Intake 5446.67 ml  Output   3270 ml  Net 2176.67 ml    Labs:  [facility] 01/31/11 0435  HGB 10.5*    Basename 01/31/11 0435  WBC 9.4  RBC 3.05*  HCT 29.4*  PLT 139*    Basename 01/31/11 0435  NA 133*  K 3.8  CL 98  CO2 28  BUN 14  CREATININE 0.76  GLUCOSE 183*  CALCIUM 8.2*    Basename 01/31/11 0435  LABPT --  INR 1.21    Exam - Neurovascular intact Sensation intact distally Intact pulses distally Dorsiflexion/Plantar flexion intact No cellulitis present Compartment soft Dressing - clean, frank blood drainage. Hemovac was pulled out at some point during transport from OR to floor and was therefore nonfunctional.  Motor function intact - moving foot and toes well on exam.  Hemovac pulled without difficulty.  Past Medical History  Diagnosis Date  . Shortness of breath     with exertion  . Anxiety   . Arthritis   . Hypertension   . Diabetes mellitus     Assessment/Plan: 1 Day Post-Op Procedure(s) (LRB): TOTAL KNEE ARTHROPLASTY  (Right) We changed the dressing today due to some bleeding through the ACE wrap. Wound looks fine, just some bleeding from the hemovac site. No signs of infection. Will start therapy today. Will keep close eye on patient as he typically drinks six beers per day. Nurse aware of this. We will also recheck liver function panel due to abnormal labs pre-op.  Advance diet Up with therapy Discharge home with home health after goals are met  DVT Prophylaxis - Xarelto Protocol Weight-Bearing as tolerated to Right leg   Nechelle Petrizzo LAUREN with Dr. Darrelyn Hillock 01/31/2011, 7:21 AM

## 2011-01-31 NOTE — Progress Notes (Signed)
Physical Therapy Evaluation Patient Details Name: Raymond Herring MRN: [id number] DOB: 02-Feb-1950 Today's Date: 01/31/[phone] ev2 Problem List:  Patient Active Problem List  Diagnoses  . Primary osteoarthritis of right knee  . Knee osteoarthritis    Past Medical History:  Past Medical History  Diagnosis Date  . Shortness of breath     with exertion  . Anxiety   . Arthritis   . Hypertension   . Diabetes mellitus    Past Surgical History:  Past Surgical History  Procedure Date  . Knee cartilage surgery [id number]    right and left knee    PT Assessment/Plan/Recommendation PT Assessment Clinical Impression Statement: pt s/p RTLA is having pain control difficulties. c/o dizziness and nausea upon sitting and stand, can benefit from PT to improve functional mobility, ROM, strength to DC home PT Recommendation/Assessment: Patient will need skilled PT in the acute care venue PT Problem List: Decreased strength;Decreased range of motion;Decreased activity tolerance;Decreased mobility;Decreased knowledge of use of DME;Decreased safety awareness;Pain PT Therapy Diagnosis : Difficulty walking;Acute pain PT Plan PT Frequency: 7X/week PT Treatment/Interventions: DME instruction;Gait training;Stair training;Functional mobility training;Therapeutic activities;Therapeutic exercise;Patient/family education PT Recommendation Recommendations for Other Services: OT consult Follow Up Recommendations: Home health PT Equipment Recommended: None recommended by PT PT Goals  Acute Rehab PT Goals PT Goal Formulation: With patient Time For Goal Achievement: 7 days Pt will go Supine/Side to Sit: with supervision;with HOB 0 degrees PT Goal: Supine/Side to Sit - Progress: Goal set today Pt will go Sit to Supine/Side: with supervision PT Goal: Sit to Supine/Side - Progress: Goal set today Pt will go Sit to Stand: with supervision PT Goal: Sit to Stand - Progress: Goal set today Pt will go  Stand to Sit: with supervision PT Goal: Stand to Sit - Progress: Goal set today Pt will Ambulate: >150 feet;with supervision;with rolling walker PT Goal: Ambulate - Progress: Goal set today Pt will Go Up / Down Stairs: 3-5 stairs;with min assist;with least restrictive assistive device PT Goal: Up/Down Stairs - Progress: Goal set today Pt will Perform Home Exercise Program: with supervision, verbal cues required/provided PT Goal: Perform Home Exercise Program - Progress: Goal set today  PT Evaluation Precautions/Restrictions  Precautions Precautions: Knee Required Braces or Orthoses: Yes Restrictions Weight Bearing Restrictions: No Prior Functioning  Home Living Lives With: Spouse Receives Help From: Family Type of Home: House Home Layout: Two level;1/2 bath on main level Alternate Level Stairs-Rails: Left Alternate Level Stairs-Number of Steps: 10 Home Access: Stairs to enter Entrance Stairs-Rails: None Entrance Stairs-Number of Steps: 4 Home Adaptive Equipment: Walker - rolling Prior Function Level of Independence: Independent with basic ADLs Able to Take Stairs?: Yes Driving: Yes Vocation: Retired Financial risk analyst Arousal/Alertness: Awake/alert Overall Cognitive Status: Appears within functional limits for tasks assessed Orientation Level: Oriented X4 Sensation/Coordination Sensation Light Touch: Impaired Detail Light Touch Impaired Details: Impaired RLE;Impaired LLE (neuropathy bil) Coordination Gross Motor Movements are Fluid and Coordinated: Yes Extremity Assessment RUE Assessment RUE Assessment: Within Functional Limits LUE Assessment LUE Assessment: Within Functional Limits RLE Assessment RLE Assessment: Exceptions to [facility] RLE AROM (degrees) RLE Overall AROM Comments: tolerated 30 degrees flexion knee RLE Strength RLE Overall Strength Comments: mod assist for slr LLE Assessment LLE Assessment: Within Functional Limits Mobility (including Balance) Bed  Mobility Bed Mobility: Yes Supine to Sit: 3: Mod assist Transfers Transfers: Yes Sit to Stand: 3: Mod assist;From bed;From elevated surface;With upper extremity assist Sit to Stand Details (indicate cue type and reason): vc for pushing off bed Stand  to Sit: 4: Min assist;With armrests;To chair/3-in-1 Stand to Sit Details: vc to place RLE forward, reach back Ambulation/Gait Ambulation/Gait: Yes Ambulation/Gait Assistance: 3: Mod assist Ambulation/Gait Assistance Details (indicate cue type and reason): vc to place weight  on RLE Ambulation Distance (Feet): 5 Feet Assistive device: Rolling walker Gait Pattern: Decreased stance time - right  Posture/Postural Control Posture/Postural Control: No significant limitations Exercise  Total Joint Exercises Ankle Circles/Pumps: AROM;Right;10 reps;Supine Quad Sets: AROM;Right;10 reps;Supine Heel Slides: AAROM;Right;10 reps;Supine Hip ABduction/ADduction: AAROM;Right;10 reps;Supine Straight Leg Raises: AAROM;Right;10 reps;Supine End of Session PT - End of Session Equipment Utilized During Treatment: Right knee immobilizer Activity Tolerance: Patient limited by pain Patient left: in chair;with call bell in reach Nurse Communication: Mobility status for transfers General Behavior During Session: [facility] for tasks performed Cognition: [facility] for tasks performed  [name] 01/31/2011, 2:45 PM

## 2011-01-31 NOTE — Op Note (Signed)
NAMEANTHANY, Raymond Herring NO.:  [id number]  MEDICAL RECORD NO.:  [id number]  LOCATION:  1617                         FACILITY:  [facility]  PHYSICIAN:  Georges Lynch. Daquann Merriott, M.D.DATE OF BIRTH:  01/08/1951  DATE OF PROCEDURE:  01/30/2011 DATE OF DISCHARGE:                              OPERATIVE REPORT   SURGEON:  Georges Lynch. 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  OPERATION:  Right total knee arthroplasty utilizing DePuy system.  I utilized a size 41 patella, size 6 femur, size 6 tibial tray with a size 6, 10 mm thickness rotating platform.  All 3 components were cemented and there was antibiotic in the cement.  PROCEDURE IN DETAIL:  Under general anesthesia, routine orthopedic prepping and draping of the right lower extremity was carried out. Appropriate time-out was carried out before any incisions.  I also marked the appropriate right leg in the holding area.  At this time, with the leg placed in the appropriate DeMayo leg holder, I first marked my incision site and then went ahead and Esmarched the leg to exsanguinate the leg and the tourniquet was elevated to 325 mmHg.  At this time, an incision was made over the anterior aspect of the right knee, bleeders identified and cauterized.  Two flaps were created.  I then carried out a median parapatellar incision.  Following that, I reflected the patella laterally, did medial and lateral meniscectomies. Note, this was a COMPLEX knee.  His knee was extremely tight.  It was extremely contracted.  He had bone on bone.  We took a great deal of time to gradually dissect all this out in a proper fashion.  At this particular time, we  finally made an initial drill hole in the intercondylar notch of the femur.  A guidewire was inserted and at this particular time, I removed 12 mm thickness off the distal femur because of the severe contracture noted.  Following that, we then inserted our next jig after taking the appropriate measurements for a size 6 femur. We then made our appropriate cuts as far as the anterior-posterior and chamfer cuts in the usual fashion.  Following that, we then did a nice medial release on the medial side of the knee because of the contracture.  We made our initial drill hole in the tibial plateau.  We then inserted our canal finders, thoroughly water picked out the knee, and then we removed 6 mm thickness off the affected medial side of the tibia.  As I mentioned, the knee was extremely tight.  Great care was taken not to injure the nerve and neurological or vascular structures. Prior to making our keel cut, I then utilized our spacer blocks and we had excellent fit with the 10 mm spacer blocks in flexion and extension. He had good medial and lateral stability.  Following that, I then cut my keel cut of the tibial plateau in  the usual fashion.  I then cut my notch cut out of the femur in the usual fashion.  Thoroughly water picked out the knee.  I searched posteriorly as well.  We made sure there were no large spurs over the distal femur.  We did remove several fragments posterior immediately.  Following that, we then inserted our trial components and then did a resurfacing on the patella in the usual fashion for a size 41 patella.  Three drill holes were made in the patella.  The patellar button was inserted, the trial button for measurement purposes.  Following that, all trials were removed.  We thoroughly water picked out the knee, dried the knee out in the usual fashion, cemented all 3 components in.  After the cement was hardened, I then released the tourniquet, searched for bleeders, and  we did maintain hemostasis.  I then inserted a Hemovac drain and closed the knee in layers in the usual fashion.  The patient had 2 g of IV Ancef preop.          ______________________________ Georges Lynch. Darrelyn Hillock, M.D.     RAG/MEDQ  D:  01/30/2011  T:  01/30/2011  Job:  [id number]  cc:   Barry Dienes. Eloise Harman, M.D. Fax: [phone]

## 2011-02-01 ENCOUNTER — Encounter (HOSPITAL_COMMUNITY): Payer: Self-pay | Admitting: Orthopedic Surgery

## 2011-02-01 LAB — PROTIME-INR
INR: 1.5 — ABNORMAL HIGH (ref 0.00–1.49)
Prothrombin Time: 18.4 seconds — ABNORMAL HIGH (ref 11.6–15.2)

## 2011-02-01 LAB — GLUCOSE, CAPILLARY
Glucose-Capillary: 178 mg/dL — ABNORMAL HIGH (ref 70–99)
Glucose-Capillary: 201 mg/dL — ABNORMAL HIGH (ref 70–99)
Glucose-Capillary: 165 mg/dL — ABNORMAL HIGH (ref 70–99)
Glucose-Capillary: 215 mg/dL — ABNORMAL HIGH (ref 70–99)
Glucose-Capillary: 168 mg/dL — ABNORMAL HIGH (ref 70–99)
Glucose-Capillary: 220 mg/dL — ABNORMAL HIGH (ref 70–99)

## 2011-02-01 LAB — BASIC METABOLIC PANEL
BUN: 11 mg/dL (ref 6–23)
CO2: 27 mEq/L (ref 19–32)
Calcium: 8.7 mg/dL (ref 8.4–10.5)
Chloride: 98 mEq/L (ref 96–112)
Creatinine, Ser: 0.77 mg/dL (ref 0.50–1.10)
GFR calc Af Amer: >90 mL/min (ref >90)
GFR calc non Af Amer: 89 mL/min — ABNORMAL LOW (ref >90)
Glucose, Bld: 180 mg/dL — ABNORMAL HIGH (ref 70–99)
Potassium: 3.7 mEq/L (ref 3.5–5.1)
Sodium: 131 mEq/L — ABNORMAL LOW (ref 135–145)

## 2011-02-01 LAB — CBC
HCT: 23.3 % — ABNORMAL LOW (ref 36.0–46.0)
Hemoglobin: 8.6 g/dL — ABNORMAL LOW (ref 12.0–15.0)
MCH: 34.7 pg — ABNORMAL HIGH (ref 26.0–34.0)
MCHC: 36.5 g/dL — ABNORMAL HIGH (ref 30.0–36.0)
MCV: 95.1 fL (ref 78.0–100.0)
Platelets: 131 10*3/uL — ABNORMAL LOW (ref 150–400)
RBC: 2.45 MIL/uL — ABNORMAL LOW (ref 3.87–5.11)
RDW: 12.5 % (ref 11.5–15.5)
WBC: 11.4 10*3/uL — ABNORMAL HIGH (ref 4.0–10.5)

## 2011-02-01 NOTE — Progress Notes (Signed)
Subjective: Doing well,but very slow in ambulation. Will follow Hbg.  Objective: Vital signs in last 24 hours: Temp:  [98.7 F (37.1 C)-100.2 F (37.9 C)] 99.8 F (37.7 C) (01/18 0530) Pulse Rate:  [92-100] 92  (01/18 0530) Resp:  [14-16] 16  (01/18 0530) BP: (129-150)/(74-82) 144/78 mmHg (01/18 0530) SpO2:  [94 %-100 %] 96 % (01/18 0530)  Intake/Output from previous day: 01/17 0701 - 01/18 0700 In: 2020 [P.O.:720; I.V.:1300] Out: 1450 [Urine:1450] Intake/Output this shift: Total I/O In: -  Out: 100 [Urine:100]   Basename 02/01/11 0423 01/31/11 0435  HGB 8.6* 10.5*    Basename 02/01/11 0423 01/31/11 0435  WBC 11.4* 9.4  RBC 2.45* 3.05*  HCT 23.3* 29.4*  PLT 131* 139*    Basename 02/01/11 0423 01/31/11 0435  NA 131* 133*  K 3.7 3.8  CL 98 98  CO2 27 28  BUN 11 14  CREATININE 0.77 0.76  GLUCOSE 180* 183*  CALCIUM 8.7 8.2*    Basename 02/01/11 0423 01/31/11 0435  LABPT -- --  INR 1.50* 1.21    Neurologically intact Dressing Dry.  Assessment/Plan: DC plans for Sat. Or Daneen Schick A 02/01/2011, 8:36 AM

## 2011-02-01 NOTE — Progress Notes (Signed)
Occupational Therapy Evaluation Patient Details Name: Raymond Herring MRN: [id number] DOB: 04/26/1950 Today's Date: 02/01/2011 1030 1057 ev2 Problem List:  Patient Active Problem List  Diagnoses  . Primary osteoarthritis of right knee  . Knee osteoarthritis    Past Medical History:  Past Medical History  Diagnosis Date  . Shortness of breath     with exertion  . Anxiety   . Arthritis   . Hypertension   . Diabetes mellitus    Past Surgical History:  Past Surgical History  Procedure Date  . Knee cartilage surgery 5366,4403    right and left knee    OT Assessment/Plan/Recommendation OT Assessment Clinical Impression Statement: This 61 year old male who was admitted for R TKA.  He is overall min A/min guard for ADLs and will have help at home.  He needs reinforcement with safety for sit to stand otherwise demonstrated good safety during the evaluation.  Will follow in acute.  Do not anticipate need for any further OT after this.   OT Recommendation/Assessment: Patient will need skilled OT in the acute care venue OT Problem List: Decreased safety awareness;Decreased strength;Pain OT Therapy Diagnosis : Generalized weakness OT Plan OT Frequency: Min 2X/week OT Treatment/Interventions: Self-care/ADL training;Patient/family education;Cognitive remediation/compensation OT Recommendation Follow Up Recommendations: No OT follow up Equipment Recommended: 3 in 1 bedside comode Individuals Consulted Consulted and Agree with Results and Recommendations: Patient OT Goals Acute Rehab OT Goals OT Goal Formulation: With patient Time For Goal Achievement: 7 days ADL Goals Pt Will Transfer to Toilet: with supervision;Ambulation;3-in-1;Other (comment) (no safety cues) ADL Goal: Toilet Transfer - Progress: Goal set today Pt Will Perform Toileting - Clothing Manipulation: with supervision;Standing ADL Goal: Toileting - Clothing Manipulation - Progress: Goal set today Pt Will Perform  Toileting - Hygiene: with supervision;Standing at 3-in-1/toilet ADL Goal: Toileting - Hygiene - Progress: Goal set today Miscellaneous OT Goals Miscellaneous OT Goal #1: Pt will gather clothes with RW with no more than 1 vc for safety and supervision OT Goal: Miscellaneous Goal #1 - Progress: Goal set today  OT Evaluation Precautions/Restrictions  Precautions Precautions: Knee Required Braces or Orthoses: Yes Restrictions Weight Bearing Restrictions: No RLE Weight Bearing: Weight bearing as tolerated Prior Functioning Home Living Bathroom Shower/Tub: Tub/shower unit Bathroom Toilet: Standard   ADL ADL Grooming: Simulated;Supervision/safety Where Assessed - Grooming: Standing at sink Upper Body Bathing: Simulated;Set up Where Assessed - Upper Body Bathing: Sitting, chair Lower Body Bathing: Minimal assistance;Performed Where Assessed - Lower Body Bathing: Sit to stand from chair (from commode) Upper Body Dressing: Performed;Minimal assistance;Other (comment) (iv) Where Assessed - Upper Body Dressing: Sitting, chair;Supported Lower Body Dressing: Simulated;Moderate assistance Where Assessed - Lower Body Dressing: Sit to stand from chair Toilet Transfer: Performed;Minimal assistance;Other (comment) (min guard) Toilet Transfer Details (indicate cue type and reason): min cues for safety to feel back of chair with LLE Toilet Transfer Method: Ambulating Toilet Transfer Equipment: Raised toilet seat with arms (or 3-in-1 over toilet) Toileting - Clothing Manipulation: Simulated;Minimal assistance (min guard) Where Assessed - Toileting Clothing Manipulation: Sit to stand from 3-in-1 or toilet Toileting - Hygiene: Performed;Minimal assistance (min guard) Where Assessed - Toileting Hygiene: Sit to stand from 3-in-1 or toilet Tub/Shower Transfer: Other (comment) (discussed tub readiness; pt will sponge bathe at first) Equipment Used: Rolling walker Ambulation Related to ADLs: min guard  ambulating to bathroom ADL Comments: pt will have help with adls.  Will need 3:1 commode.  Min cues for safety for stand to sit Vision/Perception  Vision - History Baseline  Vision: Bifocals Patient Visual Report: No change from baseline Cognition Cognition Arousal/Alertness: Awake/alert Overall Cognitive Status: Appears within functional limits for tasks assessed Orientation Level: Oriented X4 Cognition - Other Comments: min cues for safety stand to sit Sensation/Coordination   Extremity Assessment RUE Assessment RUE Assessment: Within Functional Limits LUE Assessment LUE Assessment: Within Functional Limits Mobility  Bed Mobility Supine to Sit: 4: Min assist Transfers Sit to Stand: 4: Min assist;From elevated surface Stand to Sit: 4: Min assist (min guard) Stand to Sit Details: min cues for safety Exercises   End of Session OT - End of Session Patient left: with call bell in reach;in chair General Behavior During Session: [facility] for tasks performed Cognition: [facility] for tasks performed [facility], OTR/L [phone] 02/01/2011  Favor Kreh 02/01/2011, 11:52 AM

## 2011-02-01 NOTE — Progress Notes (Signed)
  CARE MANAGEMENT NOTE 02/01/2011  Patient:  Raymond Herring, Raymond Herring   Account Number:  [id number]  Date Initiated:  02/01/2011  Documentation initiated by:  Colleen Can  Subjective/Objective Assessment:   dx osteoarthritis knee-right  Total knee replacemnt     Action/Plan:   CM spoke with patient. Pt plans to go back to his home in [place] where his spouse will be caregiver. He requesting Raymond Herring for hh services. Already has RW   Anticipated DC Date:  02/02/2011   Anticipated DC Plan:  HOME W HOME HEALTH SERVICES  In-house referral  NA      DC Planning Services  CM consult      [facility] Choice  HOME HEALTH   Choice offered to / List presented to:  C-1 Patient   DME arranged  NA      DME agency  NA     HH arranged  HH-2 PT  HH-3 OT      [facility] agency  [facility]   Status of service:  In process, will continue to follow Medicare Important Message given?   (If response is "NO", the following Medicare IM given date fields will be blank) Date Medicare IM given:   Date Additional Medicare IM given:    Discharge Disposition:    Per UR Regulation:    Comments:  02/01/2011 Raymond Herring BSN CCM [phone] [facility] agencies for choice placed in shadow chart.

## 2011-02-01 NOTE — Progress Notes (Signed)
Physical Therapy Treatment Patient Details Name: Raymond Herring MRN: [id number] DOB: October 31, 1950 Today's Date: 02/01/2011 1131-1155 e g PT Assessment/Plan  PT - Assessment/Plan Comments on Treatment Session: pt pushing self inspite of pain of 5/10.pt has difficulty with extension. pt did co of feeling dizzy at first standing PT Plan: Discharge plan remains appropriate PT Frequency: 7X/week Recommendations for Other Services: OT consult Follow Up Recommendations: Home health PT Equipment Recommended: 3 in 1 bedside comode PT Goals  Acute Rehab PT Goals PT Goal Formulation: With patient Time For Goal Achievement: 7 days Pt will go Supine/Side to Sit: with supervision PT Goal: Supine/Side to Sit - Progress: Progressing toward goal Pt will go Sit to Supine/Side: with supervision PT Goal: Sit to Supine/Side - Progress: Progressing toward goal Pt will go Sit to Stand: with supervision PT Goal: Sit to Stand - Progress: Progressing toward goal Pt will go Stand to Sit: with supervision PT Goal: Stand to Sit - Progress: Progressing toward goal Pt will Ambulate: >150 feet;with supervision;with rolling walker PT Goal: Ambulate - Progress: Progressing toward goal Pt will Go Up / Down Stairs: 3-5 stairs;with min assist;with least restrictive assistive device Pt will Perform Home Exercise Program: with supervision, verbal cues required/provided PT Goal: Perform Home Exercise Program - Progress: Progressing toward goal  PT Treatment Precautions/Restrictions  Precautions Precautions: Knee Required Braces or Orthoses: Yes Knee Immobilizer: Discontinue once straight leg raise with < 10 degree lag Restrictions Weight Bearing Restrictions: No RLE Weight Bearing: Weight bearing as tolerated Mobility (including Balance) Bed Mobility Sit to Supine: 4: Min assist Sit to Supine - Details (indicate cue type and reason): assist for RLE onto bed Transfers Sit to Stand: 4: Min assist;From  chair/3-in-1;With upper extremity assist;With armrests Sit to Stand Details (indicate cue type and reason): cues for UE placement Stand to Sit: 4: Min assist;To bed;With upper extremity assist Stand to Sit Details: cues to step out RLE and reach back to bed Ambulation/Gait Ambulation/Gait: Yes Ambulation/Gait Assistance: 4: Min assist Ambulation/Gait Assistance Details (indicate cue type and reason): cues for sequence, posture and position from RW Ambulation Distance (Feet): 100 Feet Assistive device: Rolling walker Gait Pattern: Step-to pattern;Decreased stance time - right;Trunk flexed Gait velocity: slow    Exercise  Total Joint Exercises Ankle Circles/Pumps: AROM;Both;10 reps Quad Sets: AROM;Both;10 reps Short Arc QuadBarbaraann Boys;Right;10 reps;Supine Heel Slides: AAROM;Right;10 reps Hip ABduction/ADduction: AAROM;Right;10 reps Straight Leg Raises: AAROM;Right;10 reps End of Session PT - End of Session Activity Tolerance: Patient limited by pain;Patient limited by fatigue Patient left: in bed;with call bell in reach Nurse Communication: Mobility status for transfers General Behavior During Session: [facility] for tasks performed Cognition: [facility] for tasks performed  [name] 02/01/2011, 4:51 PM

## 2011-02-01 NOTE — Progress Notes (Signed)
Physical Therapy Treatment Patient Details Name: Raymond Herring MRN: [id number] DOB: Aug 31, 1950 Today's Date: 02/01/2011 1428 - 1456; 2GT PT Assessment/Plan  PT - Assessment/Plan Comments on Treatment Session: Pt motivated but pain ltd PT Plan: Discharge plan remains appropriate PT Frequency: 7X/week Follow Up Recommendations: Home health PT Equipment Recommended: 3 in 1 bedside comode PT Goals  Acute Rehab PT Goals Time For Goal Achievement: 7 days Pt will go Supine/Side to Sit: with supervision PT Goal: Supine/Side to Sit - Progress: Progressing toward goal Pt will go Sit to Supine/Side: with supervision PT Goal: Sit to Supine/Side - Progress: Progressing toward goal Pt will go Sit to Stand: with supervision PT Goal: Sit to Stand - Progress: Progressing toward goal Pt will go Stand to Sit: with supervision PT Goal: Stand to Sit - Progress: Progressing toward goal Pt will Ambulate: >150 feet;with supervision;with rolling walker PT Goal: Ambulate - Progress: Progressing toward goal Pt will Go Up / Down Stairs: 3-5 stairs;with min assist;with least restrictive assistive device  PT Treatment Precautions/Restrictions  Precautions Precautions: Knee Required Braces or Orthoses: Yes Knee Immobilizer: Discontinue once straight leg raise with < 10 degree lag Restrictions Weight Bearing Restrictions: No RLE Weight Bearing: Weight bearing as tolerated Mobility (including Balance) Bed Mobility Sit to Supine: 4: Min assist Sit to Supine - Details (indicate cue type and reason): min assist with R LE Transfers Sit to Stand: 4: Min assist;From chair/3-in-1;With upper extremity assist;With armrests Sit to Stand Details (indicate cue type and reason): cues for LE position and use or UEs Stand to Sit: 4: Min assist;To bed;To chair/3-in-1;With upper extremity assist;With armrests Stand to Sit Details: cues for LE position and use or UEs Ambulation/Gait Ambulation/Gait Assistance: 4: Min  assist Ambulation/Gait Assistance Details (indicate cue type and reason): cues for sequence, posture and position from RW Ambulation Distance (Feet): 25 Feet (x2) Assistive device: Rolling walker Gait Pattern: Step-to pattern;Decreased stance time - right;Trunk flexed Gait velocity: slow    Exercise    End of Session PT - End of Session Activity Tolerance: Patient limited by pain;Patient limited by fatigue Patient left: in bed;with call bell in reach Nurse Communication: Mobility status for transfers General Behavior During Session: [facility] for tasks performed Cognition: [facility] for tasks performed  Charmika Macdonnell 02/01/2011, 3:56 PM

## 2011-02-02 LAB — GLUCOSE, CAPILLARY
Glucose-Capillary: 164 mg/dL — ABNORMAL HIGH (ref 70–99)
Glucose-Capillary: 176 mg/dL — ABNORMAL HIGH (ref 70–99)
Glucose-Capillary: 143 mg/dL — ABNORMAL HIGH (ref 70–99)

## 2011-02-02 LAB — CBC
HCT: 20.8 % — ABNORMAL LOW (ref 36.0–46.0)
Hemoglobin: 7.6 g/dL — ABNORMAL LOW (ref 12.0–15.0)
MCH: 35 pg — ABNORMAL HIGH (ref 26.0–34.0)
MCHC: 36.5 g/dL — ABNORMAL HIGH (ref 30.0–36.0)
MCV: 95.9 fL (ref 78.0–100.0)
Platelets: 137 10*3/uL — ABNORMAL LOW (ref 150–400)
RBC: 2.17 MIL/uL — ABNORMAL LOW (ref 3.87–5.11)
RDW: 12.8 % (ref 11.5–15.5)
WBC: 10.8 10*3/uL — ABNORMAL HIGH (ref 4.0–10.5)

## 2011-02-02 LAB — PREPARE RBC (CROSSMATCH)

## 2011-02-02 MED ORDER — FUROSEMIDE 10 MG/ML IJ SOLN
20.0000 mg | Freq: Once | INTRAMUSCULAR | Status: AC
  Administered 2011-02-02: 20 mg via INTRAVENOUS
  Filled 2011-02-02: qty 2

## 2011-02-02 MED ORDER — BISACODYL 5 MG PO TBEC
10.0000 mg | DELAYED_RELEASE_TABLET | Freq: Every day | ORAL | Status: DC | PRN
  Administered 2011-02-02: 10 mg via ORAL

## 2011-02-02 MED ORDER — DIPHENHYDRAMINE HCL 25 MG PO CAPS
25.0000 mg | ORAL_CAPSULE | Freq: Once | ORAL | Status: AC
  Administered 2011-02-02: 25 mg via ORAL
  Filled 2011-02-02: qty 1

## 2011-02-02 NOTE — Progress Notes (Signed)
PT Cancellation Note  Treatment cancelled today due to medical issues with patient which prohibited therapy. Hgb 7.6, getting blood and sleeping, attempt later.  [facility] 02/02/2011, 12:57 PM

## 2011-02-02 NOTE — Progress Notes (Signed)
Subjective: 3 Days Post-Op Procedure(s) (LRB): TOTAL KNEE ARTHROPLASTY (Right) Patient reports pain as 5 on 0-10 scale.   Denies CP or SOB.  Voiding without difficulty. Positive flatus.  Pt denies any nausea or dizziness. Pt is tired. Objective: Vital signs in last 24 hours: Temp:  [98.5 F (36.9 C)-99.8 F (37.7 C)] 98.5 F (36.9 C) (01/19 0442) Pulse Rate:  [91-101] 91  (01/19 0442) Resp:  [16] 16  (01/19 0442) BP: (110-144)/(69-77) 110/69 mmHg (01/19 0442) SpO2:  [96 %-100 %] 100 % (01/19 0442)  Intake/Output from previous day: 01/18 0701 - 01/19 0700 In: 2720 [P.O.:480; I.V.:2240] Out: 400 [Urine:400] Intake/Output this shift:     Basename 02/02/11 0456 02/01/11 0423 01/31/11 0435  HGB 7.6* 8.6* 10.5*    Basename 02/02/11 0456 02/01/11 0423  WBC 10.8* 11.4*  RBC 2.17* 2.45*  HCT 20.8* 23.3*  PLT 137* 131*    Basename 02/01/11 0423 01/31/11 0435  NA 131* 133*  K 3.7 3.8  CL 98 98  CO2 27 28  BUN 11 14  CREATININE 0.77 0.76  GLUCOSE 180* 183*  CALCIUM 8.7 8.2*    Basename 02/01/11 0423 01/31/11 0435  LABPT -- --  INR 1.50* 1.21    Neurologically intact Sensation intact distally Dorsiflexion/Plantar flexion intact Incision: no drainage Compartment soft  Assessment/Plan: 3 Days Post-Op Procedure(s) (LRB): TOTAL KNEE ARTHROPLASTY (Right) Transfuse 2 unit PRBC Repeat H/H in am Daily dsg change Poss d/c in am Xarelto  Raymond Herring R. 02/02/2011, 8:07 AM

## 2011-02-02 NOTE — Progress Notes (Signed)
Physical Therapy Treatment Patient Details Name: Raymond Herring MRN: [id number] DOB: [birth year] Today's Date: 1/19/20132 gt PT Assessment/Plan  PT - Assessment/Plan Comments on Treatment Session: pt just started second unit of blood; still fatigued PT Plan: Discharge plan remains appropriate Follow Up Recommendations: Home health PT Equipment Recommended: None recommended by PT PT Goals  Acute Rehab PT Goals PT Goal: Supine/Side to Sit - Progress: Progressing toward goal PT Goal: Sit to Stand - Progress: Progressing toward goal PT Goal: Stand to Sit - Progress: Progressing toward goal PT Goal: Ambulate - Progress: Progressing toward goal  PT Treatment Precautions/Restrictions  Precautions Precautions: Knee Required Braces or Orthoses: Yes Knee Immobilizer: Discontinue once straight leg raise with < 10 degree lag Restrictions Weight Bearing Restrictions: No RLE Weight Bearing: Weight bearing as tolerated Mobility (including Balance) Bed Mobility Supine to Sit: 4: Min assist Supine to Sit Details (indicate cue type and reason): with RLE, cues to use UEs Transfers Sit to Stand: 4: Min assist;From elevated surface;From bed;With upper extremity assist Sit to Stand Details (indicate cue type and reason): cues for UE placement Stand to Sit: 4: Min assist;To chair/3-in-1;With armrests Stand to Sit Details: cues to step out RLE and reach back  Ambulation/Gait Ambulation/Gait Assistance: 4: Min assist Ambulation/Gait Assistance Details (indicate cue type and reason): cues for sequence, posture and position from RW Ambulation Distance (Feet): 40 Feet Assistive device: Rolling walker Gait Pattern: Step-to pattern (3 standing rests) Gait velocity: slow  Posture/Postural Control Posture/Postural Control: No significant limitations Exercise    End of Session PT - End of Session Equipment Utilized During Treatment: Gait belt;Right knee immobilizer Activity Tolerance: Patient  limited by fatigue Patient left: in chair;with call bell in reach Nurse Communication: Mobility status for transfers General Behavior During Session: [facility] for tasks performed Cognition: [facility] for tasks performed  [facility] 02/02/2011, 3:48 PM

## 2011-02-02 NOTE — Progress Notes (Signed)
PT Cancellation Note  Treatment cancelled today due to patient's refusal to participate.  Pt declines this pm due to "just generally feeling crappy".   Pt finishing second unit of blood and with a slight temp per RN.  Will see in am.   [facility] 02/02/2011, 4:39 PM

## 2011-02-03 LAB — GLUCOSE, CAPILLARY
Glucose-Capillary: 168 mg/dL — ABNORMAL HIGH (ref 70–99)
Glucose-Capillary: 180 mg/dL — ABNORMAL HIGH (ref 70–99)

## 2011-02-03 LAB — CBC
HCT: 23.5 % — ABNORMAL LOW (ref 36.0–46.0)
Hemoglobin: 8.4 g/dL — ABNORMAL LOW (ref 12.0–15.0)
MCH: 32.8 pg (ref 26.0–34.0)
MCHC: 35.7 g/dL (ref 30.0–36.0)
MCV: 91.8 fL (ref 78.0–100.0)
Platelets: 182 10*3/uL (ref 150–400)
RBC: 2.56 MIL/uL — ABNORMAL LOW (ref 3.87–5.11)
RDW: 15.6 % — ABNORMAL HIGH (ref 11.5–15.5)
WBC: 9.7 10*3/uL (ref 4.0–10.5)

## 2011-02-03 MED ORDER — METHOCARBAMOL 500 MG PO TABS: 500.0000 mg | ORAL_TABLET | Freq: Four times a day (QID) | ORAL | Status: AC | PRN

## 2011-02-03 MED ORDER — RIVAROXABAN 10 MG PO TABS: 20.0000 mg | ORAL_TABLET | Freq: Every day | ORAL | Status: DC

## 2011-02-03 NOTE — Progress Notes (Signed)
Physical Therapy Treatment Patient Details Name: Raymond Herring MRN: [id number] DOB: [birth year] Today's Date: 02/03/2011 0930-1006 2 gt PT Assessment/Plan  PT - Assessment/Plan Comments on Treatment Session: pt feeling better but still fatigued PT Plan: Discharge plan remains appropriate Follow Up Recommendations: Home health PT Equipment Recommended: None recommended by PT PT Goals  Acute Rehab PT Goals PT Goal: Sit to Stand - Progress: Met PT Goal: Stand to Sit - Progress: Met PT Goal: Ambulate - Progress: Met PT Goal: Up/Down Stairs - Progress: Met PT Goal: Perform Home Exercise Program - Progress: Met  PT Treatment Precautions/Restrictions  Precautions Precautions: Knee Required Braces or Orthoses: Yes Knee Immobilizer: Discontinue once straight leg raise with < 10 degree lag Restrictions Weight Bearing Restrictions: No RLE Weight Bearing: Weight bearing as tolerated Mobility (including Balance) Transfers Sit to Stand: 5: Supervision;From chair/3-in-1;With upper extremity assist Sit to Stand Details (indicate cue type and reason): cues for hands Stand to Sit: 5: Supervision;To chair/3-in-1 Stand to Sit Details: cues for hands and safety Ambulation/Gait Ambulation/Gait Assistance: 4: Min assist;5: Supervision Ambulation/Gait Assistance Details (indicate cue type and reason): cues step length Ambulation Distance (Feet): 60 Feet Assistive device: Rolling walker Gait Pattern: Step-to pattern Gait velocity: slow, improving Stairs: Yes Stairs Assistance: 4: Min assist Stairs Assistance Details (indicate cue type and reason): cues for sequence Stair Management Technique: With crutches;No rails Number of Stairs: 5     Exercise  Total Joint Exercises Ankle Circles/Pumps: AROM;Both;10 reps Heel Slides: AAROM;Right;10 reps (pt self assisting with sheet) Straight Leg Raises: AAROM;Right;10 reps (with sheet) End of Session PT - End of Session Equipment Utilized During  Treatment: Gait belt;Right knee immobilizer Activity Tolerance: Patient limited by fatigue Patient left: in chair;with call bell in reach Nurse Communication: Mobility status for transfers General Behavior During Session: [facility] for tasks performed Cognition: [facility] for tasks performed  [facility] 02/03/2011, 10:12 AM

## 2011-02-07 NOTE — Discharge Summary (Signed)
Physician Discharge Summary  Patient ID: BRIXON ZHEN MRN: [id number] DOB/AGE: 05/20/1950 61 y.o.  Admit date: 01/30/2011 Discharge date: 02/07/2011  Admission Diagnoses:osteoarthritis Right Knee R Discharge Diagnoses: Osteoarthritis Active Problems:  Primary osteoarthritis of right knee  Knee osteoarthritis   Discharged Condition: good  Hospital Course: Transfused with 2-units of blood.He was very slow in ambulation.  Consults: None  Significant Diagnostic Studies: radiology: X-Ray: Post-op Right Knee.  Treatments: antibiotics: Ancef  Discharge Exam: Blood pressure 129/77, pulse 84, temperature 98.5 F (36.9 C), temperature source Oral, resp. rate 16, height 6' (1.829 m), weight 117.935 kg (260 lb), SpO2 97.00%. Extremities: Homans sign is negative, no sign of DVT  Disposition: Home-Health Care Svc  Discharge Orders    Future Orders Please Complete By Expires   Diet - low sodium heart healthy      Diet - low sodium heart healthy      Call MD / Call 911      Comments:   If you experience chest pain or shortness of breath, CALL 911 and be transported to the hospital emergency room.  If you develope a fever above 101 F, pus (white drainage) or increased drainage or redness at the wound, or calf pain, call your surgeon's office.   Constipation Prevention      Comments:   Drink plenty of fluids.  Prune juice may be helpful.  You may use a stool softener, such as Colace (over the counter) 100 mg twice a day.  Use MiraLax (over the counter) for constipation as needed.   Increase activity slowly as tolerated      Weight Bearing as taught in Physical Therapy      Comments:   Use a walker or crutches as instructed.   Discharge instructions      Comments:   Weight as tolerated right leg.  May shower if no wound drainage but keep dressed if there is.  Return office 2 wks post op--call [phone] for appt.  Rx for Xarelto, Robaxin, percocet 10/650   Call MD / Call 911      Comments:   If you experience chest pain or shortness of breath, CALL 911 and be transported to the hospital emergency room.  If you develope a fever above 101 F, pus (white drainage) or increased drainage or redness at the wound, or calf pain, call your surgeon's office.   Constipation Prevention      Comments:   Drink plenty of fluids.  Prune juice may be helpful.  You may use a stool softener, such as Colace (over the counter) 100 mg twice a day.  Use MiraLax (over the counter) for constipation as needed.   Increase activity slowly as tolerated      Weight Bearing as taught in Physical Therapy      Comments:   Use a walker or crutches as instructed.     Medication List  As of 02/07/2011  8:29 AM   STOP taking these medications         aspirin EC 81 MG tablet      celecoxib 200 MG capsule      meloxicam 15 MG tablet      sitaGLIPtin 100 MG tablet         TAKE these medications         CENTRUM SILVER PO   Take 1 tablet by mouth daily.      gabapentin 300 MG capsule   Commonly known as: NEURONTIN   Take 300 mg  by mouth 2 (two) times daily.      lisinopril 5 MG tablet   Commonly known as: PRINIVIL,ZESTRIL   Take 5 mg by mouth daily before breakfast.      metFORMIN 500 MG 24 hr tablet   Commonly known as: GLUCOPHAGE-XR   Take 500 mg by mouth at bedtime.      methocarbamol 500 MG tablet   Commonly known as: ROBAXIN   Take 1 tablet (500 mg total) by mouth every 6 (six) hours as needed.      OSTEO BI-FLEX ADV TRIPLE ST PO   Take 1 tablet by mouth daily.      propranolol 10 MG tablet   Commonly known as: INDERAL   Take 10 mg by mouth 2 (two) times daily.      rivaroxaban 10 MG Tabs tablet   Commonly known as: XARELTO   Take 2 tablets (20 mg total) by mouth daily with breakfast.      VITAMIN B-12 CR PO   Take 1 tablet by mouth daily.      vitamin C 1000 MG tablet   Take 1,000 mg by mouth daily.           Follow-up Information    Follow up with Madalynn Pickelsimer  A, MD. Schedule an appointment as soon as possible for a visit in 2 weeks.   Contact information:   [facility] [address] [place] [address] [phone]          Signed: Jacki Cones 02/07/2011, 8:29 AM

## 2012-01-15 HISTORY — PX: LAPAROSCOPIC APPENDECTOMY: SUR753

## 2012-06-18 DIAGNOSIS — R197 Diarrhea, unspecified: Secondary | ICD-10-CM | POA: Insufficient documentation

## 2012-08-27 DIAGNOSIS — M1990 Unspecified osteoarthritis, unspecified site: Secondary | ICD-10-CM | POA: Insufficient documentation

## 2012-08-27 DIAGNOSIS — F419 Anxiety disorder, unspecified: Secondary | ICD-10-CM | POA: Insufficient documentation

## 2012-08-27 DIAGNOSIS — R0602 Shortness of breath: Secondary | ICD-10-CM | POA: Insufficient documentation

## 2012-08-31 ENCOUNTER — Encounter: Payer: Self-pay | Admitting: *Deleted

## 2012-08-31 ENCOUNTER — Encounter (INDEPENDENT_AMBULATORY_CARE_PROVIDER_SITE_OTHER)

## 2012-08-31 ENCOUNTER — Encounter: Payer: Self-pay | Admitting: Cardiology

## 2012-08-31 ENCOUNTER — Ambulatory Visit (INDEPENDENT_AMBULATORY_CARE_PROVIDER_SITE_OTHER): Admitting: Cardiology

## 2012-08-31 VITALS — BP 118/74 | HR 78 | Ht 73.0 in | Wt 268.0 lb

## 2012-08-31 DIAGNOSIS — R002 Palpitations: Secondary | ICD-10-CM | POA: Insufficient documentation

## 2012-08-31 DIAGNOSIS — R0602 Shortness of breath: Secondary | ICD-10-CM

## 2012-08-31 NOTE — Assessment & Plan Note (Signed)
Plan CardioNet to further assess. 

## 2012-08-31 NOTE — Assessment & Plan Note (Signed)
Etiology unclear. Patient is diabetic. Plan stress echocardiogram to quantify LV function and to exclude ischemia.

## 2012-08-31 NOTE — Progress Notes (Signed)
Patient ID: Raymond Herring, male   DOB: [birth year], 62 y.o.   MRN: [id number] E-Cardio verite 30 day cardiac event monitor applied to patient.

## 2012-08-31 NOTE — Progress Notes (Signed)
HPI: 62 year-old male for evaluation of dyspnea. ABIs in January of 2013 were normal. Patient states that for approximately one year he has had dyspnea with exertion. There is no orthopnea, PND, pedal edema, chest pain or syncope. He also notes palpitations. These occur both with exertion and at rest. They last for approximately 1 minute and resolve spontaneously. There is associated dizziness. Because of the above we were asked to evaluate.   Current Outpatient Prescriptions  Medication Sig Dispense Refill  . Ascorbic Acid (VITAMIN C) 1000 MG tablet Take 1,000 mg by mouth daily.       Marland Kitchen aspirin 81 MG tablet Take 81 mg by mouth daily.      . Canagliflozin (INVOKANA) 300 MG TABS Take 1 tablet by mouth daily.      . celecoxib (CELEBREX) 200 MG capsule Take 200 mg by mouth 2 (two) times daily.      . Cyanocobalamin (VITAMIN B-12 CR PO) Take 1 tablet by mouth daily.       Marland Kitchen gabapentin (NEURONTIN) 300 MG capsule Take 300 mg by mouth 2 (two) times daily.       Marland Kitchen losartan (COZAAR) 100 MG tablet Take 100 mg by mouth daily.      . Misc Natural Products (OSTEO BI-FLEX ADV TRIPLE ST PO) Take 1 tablet by mouth daily.       . Multiple Vitamins-Minerals (CENTRUM SILVER PO) Take 1 tablet by mouth daily.       . pravastatin (PRAVACHOL) 20 MG tablet Take 20 mg by mouth daily.      . propranolol (INDERAL) 10 MG tablet Take 10 mg by mouth 2 (two) times daily.       . sitaGLIPtin (JANUVIA) 100 MG tablet Take 100 mg by mouth daily.      . tamsulosin (FLOMAX) 0.4 MG CAPS capsule Take 0.4 mg by mouth daily.       No current facility-administered medications for this visit.    No Known Allergies  Past Medical History  Diagnosis Date  . Anxiety   . Arthritis   . Diabetes mellitus   . Hyperlipidemia     Past Surgical History  Procedure Laterality Date  . Knee cartilage surgery  7829,5621    right and left knee  . Total knee arthroplasty  01/30/2011    Procedure: TOTAL KNEE ARTHROPLASTY;  Surgeon: Jacki Cones, MD;  Location: [facility];  Service: Orthopedics;  Laterality: Right;  . Appendectomy      History   Social History  . Marital Status: Married    Spouse Name: N/A    Number of Children: 4  . Years of Education: N/A   Occupational History  .      Retired   Social History Main Topics  . Smoking status: Former Smoker -- 2.00 packs/day for 25 years    Types: Cigarettes    Quit date: 01/24/1981  . Smokeless tobacco: Not on file  . Alcohol Use: Yes     Comment: 5-6 beers per day  . Drug Use: No  . Sexual Activity: Not on file   Other Topics Concern  . Not on file   Social History Narrative  . No narrative on file    Family History  Problem Relation Age of Onset  . Heart disease Father     Pacemaker  . CVA Father     ROS: no fevers or chills, productive cough, hemoptysis, dysphasia, odynophagia, melena, hematochezia, dysuria, hematuria, rash, seizure activity, orthopnea, PND, pedal  edema, claudication. Remaining systems are negative.  Physical Exam:   Blood pressure 118/74, pulse 78, height 6\' 1"  (1.854 m), weight 268 lb (121.564 kg).  General:  Well developed/obese in NAD Skin warm/dry Patient not depressed No peripheral clubbing Back-normal HEENT-normal/normal eyelids Neck supple/normal carotid upstroke bilaterally; no bruits; no JVD; no thyromegaly chest - CTA/ normal expansion CV - RRR/normal S1 and S2; no murmurs, rubs or gallops;  PMI nondisplaced Abdomen -NT/ND, no HSM, no mass, + bowel sounds, no bruit 2+ femoral pulses, no bruits Ext-no edema, chords, 2+ DP Neuro-grossly nonfocal  ECG sinus rhythm at a rate of 78. No ST changes.

## 2012-08-31 NOTE — Patient Instructions (Addendum)
Your physician recommends that you schedule a follow-up appointment in: 6-8 WEEKS WITH DR Jens Som  Your physician has requested that you have a stress echocardiogram. For further information please visit [email or web address] Please follow instruction sheet as given.   Your physician has recommended that you wear an event monitor. Event monitors are medical devices that record the heart's electrical activity. Doctors most often [place] these monitors to diagnose arrhythmias. Arrhythmias are problems with the speed or rhythm of the heartbeat. The monitor is a small, portable device. You can wear one while you do your normal daily activities. This is usually used to diagnose what is causing palpitations/syncope (passing out).

## 2012-09-25 ENCOUNTER — Other Ambulatory Visit (HOSPITAL_COMMUNITY)

## 2012-09-25 ENCOUNTER — Ambulatory Visit (HOSPITAL_COMMUNITY): Attending: Internal Medicine | Admitting: Radiology

## 2012-09-25 ENCOUNTER — Ambulatory Visit (HOSPITAL_BASED_OUTPATIENT_CLINIC_OR_DEPARTMENT_OTHER)

## 2012-09-25 DIAGNOSIS — R002 Palpitations: Secondary | ICD-10-CM | POA: Insufficient documentation

## 2012-09-25 DIAGNOSIS — E785 Hyperlipidemia, unspecified: Secondary | ICD-10-CM | POA: Insufficient documentation

## 2012-09-25 DIAGNOSIS — R0989 Other specified symptoms and signs involving the circulatory and respiratory systems: Secondary | ICD-10-CM

## 2012-09-25 DIAGNOSIS — Z87891 Personal history of nicotine dependence: Secondary | ICD-10-CM | POA: Insufficient documentation

## 2012-09-25 DIAGNOSIS — E119 Type 2 diabetes mellitus without complications: Secondary | ICD-10-CM | POA: Insufficient documentation

## 2012-09-25 DIAGNOSIS — R0602 Shortness of breath: Secondary | ICD-10-CM | POA: Insufficient documentation

## 2012-09-25 NOTE — Progress Notes (Signed)
Stress Echocardiogram performed.  

## 2012-09-28 ENCOUNTER — Other Ambulatory Visit (HOSPITAL_COMMUNITY)

## 2012-10-05 ENCOUNTER — Telehealth: Payer: Self-pay | Admitting: *Deleted

## 2012-10-05 NOTE — Telephone Encounter (Signed)
Spoke with pt, aware monitor reviewed by dr Jens Som shows sinus with PVC's.

## 2012-10-29 ENCOUNTER — Ambulatory Visit (INDEPENDENT_AMBULATORY_CARE_PROVIDER_SITE_OTHER): Admitting: Cardiology

## 2012-10-29 ENCOUNTER — Encounter: Payer: Self-pay | Admitting: Cardiology

## 2012-10-29 VITALS — BP 136/82 | HR 74 | Ht 73.0 in | Wt 269.0 lb

## 2012-10-29 DIAGNOSIS — R002 Palpitations: Secondary | ICD-10-CM

## 2012-10-29 DIAGNOSIS — R0602 Shortness of breath: Secondary | ICD-10-CM

## 2012-10-29 NOTE — Progress Notes (Signed)
HPI: FU dyspnea. ABIs in January of 2013 were normal. Stress echocardiogram in September 2014 normal. CardioNet performed in September of 2014 for palpitations showed sinus rhythm with PVCs. Since I last saw him, the patient has dyspnea with more extreme activities but not with routine activities. It is relieved with rest. It is not associated with chest pain. There is no orthopnea, PND or pedal edema. There is no syncope or palpitations. There is no exertional chest pain.    Current Outpatient Prescriptions  Medication Sig Dispense Refill  . Ascorbic Acid (VITAMIN C) 1000 MG tablet Take 1,000 mg by mouth daily.       Marland Kitchen aspirin 81 MG tablet Take 81 mg by mouth daily.      . Canagliflozin (INVOKANA) 300 MG TABS Take 1 tablet by mouth daily.      . celecoxib (CELEBREX) 200 MG capsule Take 200 mg by mouth 2 (two) times daily.      . Cyanocobalamin (VITAMIN B-12 CR PO) Take 1 tablet by mouth daily.       Marland Kitchen gabapentin (NEURONTIN) 300 MG capsule Take 300 mg by mouth 2 (two) times daily.       Marland Kitchen losartan (COZAAR) 100 MG tablet Take 100 mg by mouth daily.      . Misc Natural Products (OSTEO BI-FLEX ADV TRIPLE ST PO) Take 1 tablet by mouth daily.       . Multiple Vitamins-Minerals (CENTRUM SILVER PO) Take 1 tablet by mouth daily.       . pravastatin (PRAVACHOL) 20 MG tablet Take 20 mg by mouth daily.      . propranolol (INDERAL) 10 MG tablet Take 10 mg by mouth 2 (two) times daily.       . sitaGLIPtin (JANUVIA) 100 MG tablet Take 100 mg by mouth daily.      . tamsulosin (FLOMAX) 0.4 MG CAPS capsule Take 0.4 mg by mouth daily.       No current facility-administered medications for this visit.     Past Medical History  Diagnosis Date  . Anxiety   . Arthritis   . Diabetes mellitus   . Hyperlipidemia     Past Surgical History  Procedure Laterality Date  . Knee cartilage surgery  9147,8295    right and left knee  . Total knee arthroplasty  01/30/2011    Procedure: TOTAL KNEE  ARTHROPLASTY;  Surgeon: Jacki Cones, MD;  Location: [facility];  Service: Orthopedics;  Laterality: Right;  . Appendectomy      History   Social History  . Marital Status: Married    Spouse Name: N/A    Number of Children: 4  . Years of Education: N/A   Occupational History  .      Retired   Social History Main Topics  . Smoking status: Former Smoker -- 2.00 packs/day for 25 years    Types: Cigarettes    Quit date: 01/24/1981  . Smokeless tobacco: Not on file  . Alcohol Use: Yes     Comment: 5-6 beers per day  . Drug Use: No  . Sexual Activity: Not on file   Other Topics Concern  . Not on file   Social History Narrative  . No narrative on file    ROS: no fevers or chills, productive cough, hemoptysis, dysphasia, odynophagia, melena, hematochezia, dysuria, hematuria, rash, seizure activity, orthopnea, PND, pedal edema, claudication. Remaining systems are negative.  Physical Exam: Well-developed obese in no acute distress.  Skin is  warm and dry.  HEENT is normal.  Neck is supple.  Chest is clear to auscultation with normal expansion.  Cardiovascular exam is regular rate and rhythm.  Abdominal exam nontender or distended. No masses palpated. Extremities show no edema. neuro grossly intact

## 2012-10-29 NOTE — Assessment & Plan Note (Signed)
Patient's dyspnea has improved. Stress echocardiogram shows normal LV function and no evidence of ischemia. No plans for further evaluation.

## 2012-10-29 NOTE — Assessment & Plan Note (Signed)
Patient's palpitations correlated with PVCs. They do not appear to be particularly bothersome. Continue beta blocker. This can be increased in the future if needed.

## 2013-03-08 ENCOUNTER — Encounter: Payer: Self-pay | Admitting: Gastroenterology

## 2013-03-08 DIAGNOSIS — Z1389 Encounter for screening for other disorder: Secondary | ICD-10-CM | POA: Insufficient documentation

## 2013-03-08 DIAGNOSIS — E1142 Type 2 diabetes mellitus with diabetic polyneuropathy: Secondary | ICD-10-CM | POA: Insufficient documentation

## 2013-03-08 DIAGNOSIS — E1121 Type 2 diabetes mellitus with diabetic nephropathy: Secondary | ICD-10-CM | POA: Insufficient documentation

## 2013-03-23 ENCOUNTER — Encounter: Payer: Self-pay | Admitting: Podiatry

## 2013-03-23 ENCOUNTER — Ambulatory Visit (INDEPENDENT_AMBULATORY_CARE_PROVIDER_SITE_OTHER)

## 2013-03-23 ENCOUNTER — Ambulatory Visit (INDEPENDENT_AMBULATORY_CARE_PROVIDER_SITE_OTHER): Admitting: Podiatry

## 2013-03-23 VITALS — BP 133/76 | HR 84 | Resp 12

## 2013-03-23 DIAGNOSIS — B351 Tinea unguium: Secondary | ICD-10-CM

## 2013-03-23 DIAGNOSIS — R52 Pain, unspecified: Secondary | ICD-10-CM

## 2013-03-23 DIAGNOSIS — E1149 Type 2 diabetes mellitus with other diabetic neurological complication: Secondary | ICD-10-CM

## 2013-03-23 DIAGNOSIS — M79609 Pain in unspecified limb: Secondary | ICD-10-CM

## 2013-03-23 NOTE — Progress Notes (Signed)
   Subjective:    Patient ID: Raymond Herring, male    DOB: Oct 09, 1950, 63 y.o.   MRN: [id number]  HPI '' B/L THE TOENAILS HAVE DISCOLORATION FOR ABOUT 1 YEAR. THE TOES ARE GETTING WORSE. THE TOES DOES NOT HURT. TREATMENT TRIED LAMISIL LOTION FOR 2 WEEKS.''    Review of Systems  Musculoskeletal: Positive for back pain and gait problem.  Hematological: Bruises/bleeds easily.  All other systems reviewed and are negative.       Objective:   Physical Exam: I have reviewed his past medical history medications allergies surgeries and social history. Pulses are strongly palpable bilateral. Neurologic sensorium is decreased per since once the monofilament to the level of the metatarsophalangeal joints. Otherwise deep tendon reflexes are intact bilateral muscle strength is 5 over 5 dorsiflexors plantar flexors inverters everters all intrinsic musculature is intact. Orthopedic evaluation demonstrates mild HAV deformity and hammertoe deformities noted bilateral but are asymptomatic. Cutaneous evaluation demonstrates nail dystrophies to the hallux and second digit of the right foot. More than likely a nail dystrophy as well as onychomycosis to the remainder of the nail plates. Radiographic evaluation demonstrates after mentioned hammertoe deformities and HAV deformities but no other osseous abnormalities.          Assessment & Plan:  Assessment: Pain in limb secondary to diabetic peripheral neuropathy and onychomycosis 1 through 5 bilateral.  Plan: Debridement mycotic and dystrophic nails 1 through 5 bilateral covered service secondary to pain. And remember that he is a [employer] [place] [employer].

## 2013-04-26 ENCOUNTER — Encounter

## 2013-04-28 ENCOUNTER — Ambulatory Visit (AMBULATORY_SURGERY_CENTER): Payer: Self-pay | Admitting: *Deleted

## 2013-04-28 VITALS — Ht 72.0 in | Wt 259.6 lb

## 2013-04-28 DIAGNOSIS — Z1211 Encounter for screening for malignant neoplasm of colon: Secondary | ICD-10-CM

## 2013-04-28 MED ORDER — MOVIPREP 100 G PO SOLR: ORAL | Status: DC

## 2013-04-28 NOTE — Progress Notes (Signed)
No allergies to eggs or soy. No problems with anesthesia.  Pt given Emmi instructions for colonoscopy  No oxygen use  No diet drug use  

## 2013-05-10 ENCOUNTER — Ambulatory Visit (AMBULATORY_SURGERY_CENTER): Admitting: Gastroenterology

## 2013-05-10 ENCOUNTER — Encounter: Payer: Self-pay | Admitting: Gastroenterology

## 2013-05-10 VITALS — BP 98/67 | HR 61 | Temp 98.2°F | Resp 12 | Ht 72.0 in | Wt 259.0 lb

## 2013-05-10 DIAGNOSIS — Z1211 Encounter for screening for malignant neoplasm of colon: Secondary | ICD-10-CM

## 2013-05-10 DIAGNOSIS — K5730 Diverticulosis of large intestine without perforation or abscess without bleeding: Secondary | ICD-10-CM

## 2013-05-10 DIAGNOSIS — D175 Benign lipomatous neoplasm of intra-abdominal organs: Secondary | ICD-10-CM

## 2013-05-10 LAB — GLUCOSE, CAPILLARY
Glucose-Capillary: 124 mg/dL — ABNORMAL HIGH (ref 70–99)
Glucose-Capillary: 89 mg/dL (ref 70–99)

## 2013-05-10 MED ORDER — SODIUM CHLORIDE 0.9 % IV SOLN: 500.0000 mL | INTRAVENOUS | Status: DC

## 2013-05-10 NOTE — Op Note (Signed)
[place]  [address] [employer] [address]. [place], [address]   COLONOSCOPY PROCEDURE REPORT  PATIENT: Raymond Herring, Raymond Herring  MR#: [id number] BIRTHDATE: Nov 24, 1950 , 63  yrs. old GENDER: Male ENDOSCOPIST: Milus Banister, MD REFERRED RS:WNIOEV Philip Aspen, M.D. PROCEDURE DATE:  05/10/2013 PROCEDURE:   Colonoscopy, diagnostic First Screening Colonoscopy - Avg.  risk and is 50 yrs.  old or older - No.  Prior Negative Screening - Now for repeat screening. 10 or more years since last screening  History of Adenoma - Now for follow-up colonoscopy & has been > or = to 3 yrs.  N/A  Polyps Removed Today? No.  Recommend repeat exam, <10 yrs? No. ASA CLASS:   Class II INDICATIONS:average risk screening. MEDICATIONS: MAC sedation, administered by CRNA and Propofol (Diprivan) 260 mg IV  DESCRIPTION OF PROCEDURE:   After the risks benefits and alternatives of the procedure were thoroughly explained, informed consent was obtained.  A digital rectal exam revealed no abnormalities of the rectum.   The LB [id number]  endoscope was introduced through the anus and advanced to the cecum, which was identified by both the appendix and ileocecal valve. No adverse events experienced.   The quality of the prep was excellent.  The instrument was then slowly withdrawn as the colon was fully examined.   COLON FINDINGS: There was a typical appearing 1.5cm soft, yellow lipoma in transverse segment.  There were numerous diverticulum throughout the left colon.  The examination was otherwise normal. Retroflexed views revealed no abnormalities. The time to cecum=2 minutes 34 seconds.  Withdrawal time=8 minutes 07 seconds.  The scope was withdrawn and the procedure completed. COMPLICATIONS: There were no complications.  ENDOSCOPIC IMPRESSION: There was a typical appearing 1.5cm soft, yellow lipoma in transverse segment. There were numerous diverticulum throughout the left colon. The examination was  otherwise normal.  No polyps or cancers.  RECOMMENDATIONS: You should continue to follow colorectal cancer screening guidelines for "routine risk" patients with a repeat colonoscopy in 10 years. There is no need for FOBT (stool) testing for at least 5 years.   eSigned:  Milus Banister, MD 05/10/2013 9:12 AM

## 2013-05-10 NOTE — Progress Notes (Signed)
Report to pacu rn, vss, bbs=clear 

## 2013-05-10 NOTE — Patient Instructions (Signed)
YOU HAD AN ENDOSCOPIC PROCEDURE TODAY AT THE [facility]: Refer to the procedure report that was given to you for any specific questions about what was found during the examination.  If the procedure report does not answer your questions, please call your gastroenterologist to clarify.  If you requested that your care partner not be given the details of your procedure findings, then the procedure report has been included in a sealed envelope for you to review at your convenience later.  YOU SHOULD EXPECT: Some feelings of bloating in the abdomen. Passage of more gas than usual.  Walking can help get rid of the air that was put into your GI tract during the procedure and reduce the bloating. If you had a lower endoscopy (such as a colonoscopy or flexible sigmoidoscopy) you may notice spotting of blood in your stool or on the toilet paper. If you underwent a bowel prep for your procedure, then you may not have a normal bowel movement for a few days.  DIET: Your first meal following the procedure should be a light meal and then it is ok to progress to your normal diet.  A half-sandwich or bowl of soup is an example of a good first meal.  Heavy or fried foods are harder to digest and may make you feel nauseous or bloated.  Likewise meals heavy in dairy and vegetables can cause extra gas to form and this can also increase the bloating.  Drink plenty of fluids but you should avoid alcoholic beverages for 24 hours.  ACTIVITY: Your care partner should take you home directly after the procedure.  You should plan to take it easy, moving slowly for the rest of the day.  You can resume normal activity the day after the procedure however you should NOT DRIVE or use heavy machinery for 24 hours (because of the sedation medicines used during the test).    SYMPTOMS TO REPORT IMMEDIATELY: A gastroenterologist can be reached at any hour.  During normal business hours, 8:30 AM to 5:00 PM Monday through Friday,  call [phone].  After hours and on weekends, please call the GI answering service at [phone] who will take a message and have the physician on call contact you.   Following lower endoscopy (colonoscopy or flexible sigmoidoscopy):  Excessive amounts of blood in the stool  Significant tenderness or worsening of abdominal pains  Swelling of the abdomen that is new, acute  Fever of 100F or higher  FOLLOW UP: If any biopsies were taken you will be contacted by phone or by letter within the next 1-3 weeks.  Call your gastroenterologist if you have not heard about the biopsies in 3 weeks.  Our staff will call the home number listed on your records the next business day following your procedure to check on you and address any questions or concerns that you may have at that time regarding the information given to you following your procedure. This is a courtesy call and so if there is no answer at the home number and we have not heard from you through the emergency physician on call, we will assume that you have returned to your regular daily activities without incident.  SIGNATURES/CONFIDENTIALITY: You and/or your care partner have signed paperwork which will be entered into your electronic medical record.  These signatures attest to the fact that that the information above on your After Visit Summary has been reviewed and is understood.  Full responsibility of the confidentiality of this   discharge information lies with you and/or your care-partner.  Resume medications. Information given on diverticulosis and high fiber diet with discharge instructions. 

## 2013-05-11 ENCOUNTER — Telehealth: Payer: Self-pay

## 2013-05-11 NOTE — Telephone Encounter (Signed)
  Follow up Call-  Call back number 05/10/2013  Post procedure Call Back phone  # [phone]  Permission to leave phone message Yes     Patient questions:  Do you have a fever, pain , or abdominal swelling? no Pain Score  0 *  Have you tolerated food without any problems? yes  Have you been able to return to your normal activities? yes  Do you have any questions about your discharge instructions: Diet   no Medications  no Follow up visit  no  Do you have questions or concerns about your Care? no  Actions: * If pain score is 4 or above: No action needed, pain <4.

## 2013-05-27 DIAGNOSIS — L989 Disorder of the skin and subcutaneous tissue, unspecified: Secondary | ICD-10-CM | POA: Insufficient documentation

## 2013-06-03 DIAGNOSIS — G4762 Sleep related leg cramps: Secondary | ICD-10-CM | POA: Insufficient documentation

## 2013-06-03 DIAGNOSIS — R252 Cramp and spasm: Secondary | ICD-10-CM | POA: Insufficient documentation

## 2013-06-29 ENCOUNTER — Ambulatory Visit (INDEPENDENT_AMBULATORY_CARE_PROVIDER_SITE_OTHER): Admitting: Podiatry

## 2013-06-29 DIAGNOSIS — B351 Tinea unguium: Secondary | ICD-10-CM

## 2013-06-29 DIAGNOSIS — R52 Pain, unspecified: Secondary | ICD-10-CM

## 2013-06-29 DIAGNOSIS — M21371 Foot drop, right foot: Secondary | ICD-10-CM

## 2013-06-29 DIAGNOSIS — M79609 Pain in unspecified limb: Secondary | ICD-10-CM

## 2013-06-29 DIAGNOSIS — E1149 Type 2 diabetes mellitus with other diabetic neurological complication: Secondary | ICD-10-CM

## 2013-06-29 DIAGNOSIS — M216X9 Other acquired deformities of unspecified foot: Secondary | ICD-10-CM

## 2013-06-29 NOTE — Progress Notes (Signed)
Presents today for chief complaint of painful elongated toenails. Also to check his diabetic foot.  Objective: Pulses are strongly palpable bilateral. Neurologic sensorium is intact bilateral. Nails are thick yellow dystrophic onychomycotic and painful palpation.  Assessment: Pain in limb secondary to onychomycosis 1 through 5 bilateral.  Plan: Debridement of nails in thickness and length as cover service secondary to pain.

## 2013-09-28 ENCOUNTER — Ambulatory Visit (INDEPENDENT_AMBULATORY_CARE_PROVIDER_SITE_OTHER): Admitting: Podiatry

## 2013-09-28 ENCOUNTER — Encounter: Payer: Self-pay | Admitting: Podiatry

## 2013-09-28 DIAGNOSIS — M79609 Pain in unspecified limb: Secondary | ICD-10-CM

## 2013-09-28 DIAGNOSIS — M79673 Pain in unspecified foot: Secondary | ICD-10-CM

## 2013-09-28 DIAGNOSIS — B351 Tinea unguium: Secondary | ICD-10-CM

## 2013-09-28 NOTE — Progress Notes (Signed)
   Subjective:    Patient ID: Raymond Herring, male    DOB: 1950/09/04, 63 y.o.   MRN: [id number]  HPI Comments: Presents today chief complaint of painful elongated toenails.  Objective: Pulses are palpable bilateral nails are thick, yellow dystrophic onychomycosis and painful palpation.   Assessment: Onychomycosis with pain in limb.  Plan: Treatment of nails in thickness and length as covered service secondary to pain.      Review of Systems     Objective:   Physical Exam        Assessment & Plan:

## 2013-09-28 NOTE — Patient Instructions (Signed)
Diabetes and Foot Care Diabetes may cause you to have problems because of poor blood supply (circulation) to your feet and legs. This may cause the skin on your feet to become thinner, break easier, and heal more slowly. Your skin may become dry, and the skin may peel and crack. You may also have nerve damage in your legs and feet causing decreased feeling in them. You may not notice minor injuries to your feet that could lead to infections or more serious problems. Taking care of your feet is one of the most important things you can do for yourself.  HOME CARE INSTRUCTIONS  Wear shoes at all times, even in the house. Do not go barefoot. Bare feet are easily injured.  Check your feet daily for blisters, cuts, and redness. If you cannot see the bottom of your feet, use a mirror or ask someone for help.  Wash your feet with warm water (do not use hot water) and mild soap. Then pat your feet and the areas between your toes until they are completely dry. Do not soak your feet as this can dry your skin.  Apply a moisturizing lotion or petroleum jelly (that does not contain alcohol and is unscented) to the skin on your feet and to dry, brittle toenails. Do not apply lotion between your toes.  Trim your toenails straight across. Do not dig under them or around the cuticle. File the edges of your nails with an emery board or nail file.  Do not cut corns or calluses or try to remove them with medicine.  Wear clean socks or stockings every day. Make sure they are not too tight. Do not wear knee-high stockings since they may decrease blood flow to your legs.  Wear shoes that fit properly and have enough cushioning. To break in new shoes, wear them for just a few hours a day. This prevents you from injuring your feet. Always look in your shoes before you put them on to be sure there are no objects inside.  Do not cross your legs. This may decrease the blood flow to your feet.  If you find a minor scrape,  cut, or break in the skin on your feet, keep it and the skin around it clean and dry. These areas may be cleansed with mild soap and water. Do not cleanse the area with peroxide, alcohol, or iodine.  When you remove an adhesive bandage, be sure not to damage the skin around it.  If you have a wound, look at it several times a day to make sure it is healing.  Do not use heating pads or hot water bottles. They may burn your skin. If you have lost feeling in your feet or legs, you may not know it is happening until it is too late.  Make sure your health care provider performs a complete foot exam at least annually or more often if you have foot problems. Report any cuts, sores, or bruises to your health care provider immediately. SEEK MEDICAL CARE IF:   You have an injury that is not healing.  You have cuts or breaks in the skin.  You have an ingrown nail.  You notice redness on your legs or feet.  You feel burning or tingling in your legs or feet.  You have pain or cramps in your legs and feet.  Your legs or feet are numb.  Your feet always feel cold. SEEK IMMEDIATE MEDICAL CARE IF:   There is increasing redness,   swelling, or pain in or around a wound.  There is a red line that goes up your leg.  Pus is coming from a wound.  You develop a fever or as directed by your health care provider.  You notice a bad smell coming from an ulcer or wound. Document Released: 12/29/1999 Document Revised: 09/02/2012 Document Reviewed: 06/09/2012 Exit[employer] Patient Information 2015 [employer]. This information is not intended to replace advice given to you by your health care provider. Make sure you discuss any questions you have with your health care provider.  

## 2013-11-24 ENCOUNTER — Other Ambulatory Visit: Payer: Self-pay | Admitting: Orthopedic Surgery

## 2013-11-24 DIAGNOSIS — M21371 Foot drop, right foot: Secondary | ICD-10-CM

## 2013-12-01 ENCOUNTER — Inpatient Hospital Stay
Admission: RE | Admit: 2013-12-01 | Discharge: 2013-12-01 | Disposition: A | Discharge status: Home / Self Care | Payer: Self-pay | Source: Ambulatory Visit | Attending: Orthopedic Surgery | Admitting: Orthopedic Surgery

## 2013-12-01 ENCOUNTER — Other Ambulatory Visit: Payer: Self-pay | Admitting: Orthopedic Surgery

## 2013-12-01 ENCOUNTER — Ambulatory Visit
Admission: RE | Admit: 2013-12-01 | Discharge: 2013-12-01 | Disposition: A | Discharge status: Home / Self Care | Source: Ambulatory Visit | Attending: Orthopedic Surgery | Admitting: Orthopedic Surgery

## 2013-12-01 DIAGNOSIS — M21371 Foot drop, right foot: Secondary | ICD-10-CM

## 2013-12-01 MED ORDER — DIAZEPAM 5 MG PO TABS
10.0000 mg | ORAL_TABLET | Freq: Once | ORAL | Status: AC
  Administered 2013-12-01: 10 mg via ORAL

## 2013-12-01 MED ORDER — IOHEXOL 180 MG/ML  SOLN
15.0000 mL | Freq: Once | INTRAMUSCULAR | Status: AC | PRN
  Administered 2013-12-01: 15 mL via INTRATHECAL

## 2013-12-01 NOTE — Discharge Instructions (Signed)

## 2013-12-08 ENCOUNTER — Encounter: Attending: Internal Medicine | Admitting: Dietician

## 2013-12-08 ENCOUNTER — Ambulatory Visit: Admitting: Dietician

## 2013-12-08 ENCOUNTER — Encounter: Payer: Self-pay | Admitting: Dietician

## 2013-12-08 VITALS — Ht 72.0 in | Wt 260.6 lb

## 2013-12-08 DIAGNOSIS — E118 Type 2 diabetes mellitus with unspecified complications: Secondary | ICD-10-CM | POA: Diagnosis present

## 2013-12-08 DIAGNOSIS — Z713 Dietary counseling and surveillance: Secondary | ICD-10-CM | POA: Insufficient documentation

## 2013-12-08 NOTE — Patient Instructions (Addendum)
-  Try cutting down to 3 beers a night  -Healthy weight loss: 1-2 lbs a week  -Continue exercise routine  -Continue to eat at least 3x a day  -Have a protein food with every meal: light cheese, small amount of nuts, boiled egg, meat, chicken, fish   -Fruit cups in its own juice or water

## 2013-12-08 NOTE — Progress Notes (Signed)
  Medical Nutrition Therapy:  Appt start time: 0945 end time:  1110.   Assessment:  Primary concerns today: Raymond Herring is here today to discuss his weight and increased HgbA1c. He states that he has had diabetes for a few years and received diabetes education at the time of diagnosis. Checks blood sugars inconsistently: usually 125-150 mg/dL fasting. HgbA1c 6.6% but he says it "used to be in the 5s." Raymond Herring states that he would really like to lose some weight. He describes himself as very active within physical limits. Plans to start physical therapy again due to muscle tightness. In the past he was using [employer]. He recently went on a vacation for 6 weeks and regained some weight he had lost at the beginning of the year. He enjoys hunting and fishing. The patient reports that he has been having diarrhea after meals and recently started taking Align probiotic.  Preferred Learning Style:  No preference indicated   Learning Readiness:   Ready   MEDICATIONS: see list   DIETARY INTAKE:  Usual eating pattern includes 3 meals and 0 snacks per day.  Avoided foods include liver, squash.    Does not like sweets.  24-hr recall:  B ( AM): banana, toast, low sodium V8 juice, black coffee, whole grain cereal with blueberries OR [employer] breakfast bowl Snk ( AM):   L ( PM): varies; McDonald's or peanut butter or ham sandwich (1-2 sandwiches) Snk ( PM):  D ( PM): soup, salad with crackers, pot roast Snk ( PM):   Beverages: 4-6 beers a night sometimes, 2% milk, propel  Estimated energy needs: 1800-2000 calories 200-225 g carbohydrates  Progress Towards Goal(s):  In progress.   Nutritional Diagnosis:  [place]-2.2 Altered nutrition-related laboratory As related to obesity and excessive carbohydrate intake.  As evidenced by HgbA1c 6.6%.    Intervention:  Nutrition counseling provided. Identified carbohydrate-containing foods. Reviewed carbohydrate counting and recommended 3 to 4 servings  of carbs at meals. Encouraged patient to include protein with each meal and snack.  -Try cutting down to 3 beers a night -Healthy weight loss: 1-2 lbs a week -Continue exercise routine -Continue to eat at least 3x a day -Have a protein food with every meal: light cheese, small amount of nuts, boiled egg, meat, chicken, fish  -Fruit cups in its own juice or water  Teaching Method Utilized:  Visual Auditory Hands on  Handouts given during visit include:  Meal planning card  Barriers to learning/adherence to lifestyle change: none  Demonstrated degree of understanding via:  Teach Back   Monitoring/Evaluation:  Dietary intake, exercise, HgbA1c, and body weight in 3 month(s).

## 2013-12-28 ENCOUNTER — Ambulatory Visit: Admitting: Podiatry

## 2014-01-05 ENCOUNTER — Ambulatory Visit (INDEPENDENT_AMBULATORY_CARE_PROVIDER_SITE_OTHER): Admitting: Physician Assistant

## 2014-01-05 ENCOUNTER — Other Ambulatory Visit (INDEPENDENT_AMBULATORY_CARE_PROVIDER_SITE_OTHER)

## 2014-01-05 ENCOUNTER — Encounter: Payer: Self-pay | Admitting: Physician Assistant

## 2014-01-05 ENCOUNTER — Encounter: Payer: Self-pay | Admitting: *Deleted

## 2014-01-05 VITALS — BP 96/64 | HR 84 | Ht 70.75 in | Wt 260.0 lb

## 2014-01-05 DIAGNOSIS — K5730 Diverticulosis of large intestine without perforation or abscess without bleeding: Secondary | ICD-10-CM

## 2014-01-05 DIAGNOSIS — R197 Diarrhea, unspecified: Secondary | ICD-10-CM

## 2014-01-05 LAB — CBC WITH DIFFERENTIAL/PLATELET
Basophils Absolute: 0 10*3/uL (ref 0.0–0.1)
Basophils Relative: 0.7 % (ref 0.0–3.0)
Eosinophils Absolute: 0.2 10*3/uL (ref 0.0–0.7)
Eosinophils Relative: 2.8 % (ref 0.0–5.0)
HCT: 46 % (ref 39.0–52.0)
Hemoglobin: 15.7 g/dL (ref 13.0–17.0)
Lymphocytes Relative: 26.2 % (ref 12.0–46.0)
Lymphs Abs: 1.6 10*3/uL (ref 0.7–4.0)
MCHC: 34 g/dL (ref 30.0–36.0)
MCV: 103.9 fl — ABNORMAL HIGH (ref 78.0–100.0)
Monocytes Absolute: 0.8 10*3/uL (ref 0.1–1.0)
Monocytes Relative: 12.2 % — ABNORMAL HIGH (ref 3.0–12.0)
Neutro Abs: 3.6 10*3/uL (ref 1.4–7.7)
Neutrophils Relative %: 58.1 % (ref 43.0–77.0)
Platelets: 191 10*3/uL (ref 150.0–400.0)
RBC: 4.43 Mil/uL (ref 4.22–5.81)
RDW: 13.3 % (ref 11.5–15.5)
WBC: 6.2 10*3/uL (ref 4.0–10.5)

## 2014-01-05 LAB — COMPREHENSIVE METABOLIC PANEL
ALT: 45 U/L (ref 0–53)
AST: 33 U/L (ref 0–37)
Albumin: 4.6 g/dL (ref 3.5–5.2)
Alkaline Phosphatase: 54 U/L (ref 39–117)
BUN: 16 mg/dL (ref 6–23)
CO2: 26 mEq/L (ref 19–32)
Calcium: 9.7 mg/dL (ref 8.4–10.5)
Chloride: 104 mEq/L (ref 96–112)
Creatinine, Ser: 1 mg/dL (ref 0.4–1.5)
GFR: 81.86 mL/min (ref >60.00)
Glucose, Bld: 167 mg/dL — ABNORMAL HIGH (ref 70–99)
Potassium: 4.7 mEq/L (ref 3.5–5.1)
Sodium: 138 mEq/L (ref 135–145)
Total Bilirubin: 0.9 mg/dL (ref 0.2–1.2)
Total Protein: 7.5 g/dL (ref 6.0–8.3)

## 2014-01-05 LAB — TSH: TSH: 1.37 u[IU]/mL (ref 0.35–4.50)

## 2014-01-05 MED ORDER — HYOSCYAMINE SULFATE 0.125 MG SL SUBL: 0.1250 mg | SUBLINGUAL_TABLET | Freq: Three times a day (TID) | SUBLINGUAL | Status: DC

## 2014-01-05 NOTE — Progress Notes (Signed)
Patient ID: Raymond Herring, male   DOB: 27-Mar-1950, 63 y.o.   MRN: [id number]     History of Present Illness:    Raymond Herring is a pleasant 63 year old male who presents today for evaluation of diarrhea of 6 months duration. Cledith had a colonoscopy on 05/10/2013 by Dr. Ardis Hughs. He was noted to have a 1.5 cm lipoma in the transverse colon. He had numerous diverticuli in the left colon. The examination was otherwise normal. He states that since that time, he has had diarrhea every day. He has 4-6 small, watery bowel movements after each meal. He has no gas and bloating. He states he eats, gets crampy, and has several bowel movements. He has tried using align with no relief. He has no oily stools. Ingestion of fresh vegetables makes his diarrhea worse. Prior to the onset of his diarrhea, he had been on Cipro. He also reports that he has had Escherichia coli 4 times in the past while traveling. He has no new pets. He has city water. He will get some right-sided crampy abdominal pain relieved with defecation. He has no associated nausea. He has lost 5 pounds as he is working with a nutritionist to try to lose weight. He has a daughter with celiac disease who adheres to a gluten-free diet. His father currently has C. difficile and has had C. difficile 4 times in the past year. He has 5-7 beers daily and notes he will have diarrhea immediately after drinking his beer as well.   Past Medical History  Diagnosis Date  . Anxiety   . Arthritis   . Diabetes mellitus   . Hyperlipidemia   . Neuropathy     feet and fingers    Past Surgical History  Procedure Laterality Date  . Knee cartilage surgery Bilateral [id number]    right and left knee  . 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 Laparoscopic appendectomy  2014   Family History  Problem Relation Age of Onset  . Heart disease  Father     Pacemaker  . CVA Father   . Alzheimer's disease Father   . Colon cancer Neg Hx    History  Substance Use Topics  . Smoking status: Former Smoker -- 2.00 packs/day for 25 years    Types: Cigarettes    Quit date: 01/24/1981  . Smokeless tobacco: Never Used  . Alcohol Use: 21.0 oz/week    35 Cans of beer per week     Comment: 5-6 beers per day   Current Outpatient Prescriptions  Medication Sig Dispense Refill  . Ascorbic Acid (VITAMIN C) 1000 MG tablet Take 1,000 mg by mouth daily.     Marland Kitchen aspirin 81 MG tablet Take 81 mg by mouth daily.    . Canagliflozin (INVOKANA) 300 MG TABS Take 1 tablet by mouth daily.    . carisoprodol (SOMA) 250 MG tablet Take 250 mg by mouth as needed.     . celecoxib (CELEBREX) 200 MG capsule Take 200 mg by mouth 2 (two) times daily.    . cilostazol (PLETAL) 100 MG tablet Take 100 mg by mouth 2 (two) times daily.    . Cyanocobalamin (VITAMIN B-12 CR PO) Take 1 tablet by mouth daily.     Marland Kitchen gabapentin (NEURONTIN) 300 MG capsule Take 300 mg by mouth 2 (two) times daily.     Marland Kitchen losartan (COZAAR)  100 MG tablet Take 100 mg by mouth daily.    . Misc Natural Products (OSTEO BI-FLEX ADV TRIPLE ST PO) Take 1 tablet by mouth daily.     . Multiple Vitamins-Minerals (CENTRUM SILVER PO) Take 1 tablet by mouth daily.     . pravastatin (PRAVACHOL) 20 MG tablet Take 20 mg by mouth daily.    . propranolol (INDERAL) 10 MG tablet Take 10 mg by mouth 2 (two) times daily.     . sitaGLIPtin (JANUVIA) 100 MG tablet Take 100 mg by mouth daily.    . tamsulosin (FLOMAX) 0.4 MG CAPS capsule Take 0.4 mg by mouth daily.    . hyoscyamine (LEVSIN SL) 0.125 MG SL tablet Place 1 tablet (0.125 mg total) under the tongue 3 (three) times daily. 90 tablet 2   No current facility-administered medications for this visit.   No Known Allergies    Review of Systems: Gen: Denies any fever, chills, sweats, anorexia, fatigue, weakness, malaise, weight loss, and sleep disorder CV: Denies  chest pain, angina, palpitations, syncope, orthopnea, PND, peripheral edema, and claudication. Resp: Denies dyspnea at rest, dyspnea with exercise, cough, sputum, wheezing, coughing up blood, and pleurisy. GI: Denies vomiting blood, jaundice, and fecal incontinence.   Denies dysphagia or odynophagia. GU : Denies urinary burning, blood in urine, urinary frequency, urinary hesitancy, nocturnal urination, and urinary incontinence. MS: Denies joint pain, limitation of movement, and swelling, stiffness, low back pain, extremity pain. Denies muscle weakness, cramps, atrophy.  Derm: Denies rash, itching, dry skin, hives, moles, warts, or unhealing ulcers.  Psych: Denies depression, anxiety, memory loss, suicidal ideation, hallucinations, paranoia, and confusion. Heme: Denies bruising, bleeding, and enlarged lymph nodes. Neuro:  Denies any headaches, dizziness, paresthesia Endo:  Denies any problems with DM, thyroid, adrenal  LAB RESULTS:  Recent Labs  01/05/14 0916  WBC 6.2  HGB 15.7  HCT 46.0  PLT 191.0   BMET  Recent Labs  01/05/14 0916  NA 138  K 4.7  CL 104  CO2 26  GLUCOSE 167*  BUN 16  CREATININE 1.0  CALCIUM 9.7   LFT  Recent Labs  01/05/14 0916  PROT 7.5  ALBUMIN 4.6  AST 33  ALT 45  ALKPHOS 54  BILITOT 0.9      Physical Exam: General: Pleasant, well developed male in no acute distress Head: Normocephalic and atraumatic Eyes:  sclerae anicteric, conjunctiva pink  Ears: Normal auditory acuity Lungs: Clear throughout to auscultation Heart: Regular rate and rhythm Abdomen: Soft, non distended, non-tender. No masses, no hepatomegaly. Normal bowel sounds Rectal:heme negative stool Musculoskeletal: Symmetrical with no gross deformities  Extremities: No edema  Neurological: Alert oriented x 4, grossly nonfocal Psychological:  Alert and cooperative. Normal mood and affect  Assessment and Recommendations: 63 year old male with a history of diverticular disease  now with 6 months of postprandial, watery diarrhea. A CBC, comprehensive metabolic panel, TSH, and celiac panel will be obtained along with a GI pathogen panel, lactoferrin, fecal fat, and pancreatic fecal elastase. He will be given a trial of Benefiber 1 heaping tablespoon daily to try to bulk his stools and will try Florastor 1 by mouth twice daily. He will also try Levsin 0.125 mg 1 by mouth 3 times a day when necessary. He's been advised to adhere to a high-fiber, low-fat diet. Further recommendations will be made pending the findings of his laboratory testing. If he is positive for C. difficile, he will be treated accordingly. If all of the above testing is negative, he may  be considered for a trial of Xifaxan or Flagyl for possible bacterial overgrowth. If all is negative, he may be considered for repeat colonoscopy for biopsies for microscopic/collagenous colitis.    Shyler Holzman, Deloris Ping 01/05/2014,

## 2014-01-05 NOTE — Patient Instructions (Addendum)
You have a follow up visit scheduled with Raymond Herring for 01-26-2014 at 8:15 am  Your physician has requested that you go to the basement for the following lab work before leaving today: CBC CMET TSH CELIAC PANEL   STOOL GI PATHOGEN PANEL  STOOL FOR LACTOFERRIN STOOL FOR FECAL FAT  STOOL FOR PANCREATIC ELASTASE  Please purchase Benefiber over the counter and mix one heaping tablespoon in juice or water, drink once daily.  Please purchase Florastor over the counter and take one capsule twice daily for a month.  Levsin 0.125 mg was sent to your pharmacy.  Information on high fiber diet is below for your review. ___________________________________________________________________________________________________________________________________________  High-Fiber Diet Fiber is found in fruits, vegetables, and grains. A high-fiber diet encourages the addition of more whole grains, legumes, fruits, and vegetables in your diet. The recommended amount of fiber for adult males is 38 g per day. For adult females, it is 25 g per day. Pregnant and lactating women should get 28 g of fiber per day. If you have a digestive or bowel problem, ask your caregiver for advice before adding high-fiber foods to your diet. Eat a variety of high-fiber foods instead of only a select few type of foods.  PURPOSE  To increase stool bulk.  To make bowel movements more regular to prevent constipation.  To lower cholesterol.  To prevent overeating. WHEN IS THIS DIET USED?  It may be used if you have constipation and hemorrhoids.  It may be used if you have uncomplicated diverticulosis (intestine condition) and irritable bowel syndrome.  It may be used if you need help with weight management.  It may be used if you want to add it to your diet as a protective measure against atherosclerosis, diabetes, and cancer. SOURCES OF FIBER  Whole-grain breads and cereals.  Fruits, such as apples, oranges, bananas,  berries, prunes, and pears.  Vegetables, such as green peas, carrots, sweet potatoes, beets, broccoli, cabbage, spinach, and artichokes.  Legumes, such split peas, soy, lentils.  Almonds. FIBER CONTENT IN FOODS Starches and Grains / Dietary Fiber (g)  Cheerios, 1 cup / 3 g  Corn Flakes cereal, 1 cup / 0.7 g  Rice crispy treat cereal, 1 cup / 0.3 g  Instant oatmeal (cooked),  cup / 2 g  Frosted wheat cereal, 1 cup / 5.1 g  Brown, long-grain rice (cooked), 1 cup / 3.5 g  White, long-grain rice (cooked), 1 cup / 0.6 g  Enriched macaroni (cooked), 1 cup / 2.5 g Legumes / Dietary Fiber (g)  Baked beans (canned, plain, or vegetarian),  cup / 5.2 g  Kidney beans (canned),  cup / 6.8 g  Pinto beans (cooked),  cup / 5.5 g Breads and Crackers / Dietary Fiber (g)  Plain or honey graham crackers, 2 squares / 0.7 g  Saltine crackers, 3 squares / 0.3 g  Plain, salted pretzels, 10 pieces / 1.8 g  Whole-wheat bread, 1 slice / 1.9 g  White bread, 1 slice / 0.7 g  Raisin bread, 1 slice / 1.2 g  Plain bagel, 3 oz / 2 g  Flour tortilla, 1 oz / 0.9 g  Corn tortilla, 1 small / 1.5 g  Hamburger or hotdog bun, 1 small / 0.9 g Fruits / Dietary Fiber (g)  Apple with skin, 1 medium / 4.4 g  Sweetened applesauce,  cup / 1.5 g  Banana,  medium / 1.5 g  Grapes, 10 grapes / 0.4 g  Orange, 1 small / 2.3  g  Raisin, 1.5 oz / 1.6 g  Melon, 1 cup / 1.4 g Vegetables / Dietary Fiber (g)  Green beans (canned),  cup / 1.3 g  Carrots (cooked),  cup / 2.3 g  Broccoli (cooked),  cup / 2.8 g  Peas (cooked),  cup / 4.4 g  Mashed potatoes,  cup / 1.6 g  Lettuce, 1 cup / 0.5 g  Corn (canned),  cup / 1.6 g  Tomato,  cup / 1.1 g Document Released: 12/31/2004 Document Revised: 07/02/2011 Document Reviewed: 04/04/2011 Exit[employer] Patient Information 2015 [place], [place]. This information is not intended to replace advice given to you by your health care provider. Make  sure you discuss any questions you have with your health care provider.

## 2014-01-06 NOTE — Progress Notes (Signed)
I agree with the above note, plan 

## 2014-01-07 LAB — CELIAC PANEL 10
Endomysial Screen: NEGATIVE
Gliadin IgA: 9 Units (ref <20)
Gliadin IgG: 2 Units (ref <20)
IgA: 383 mg/dL — ABNORMAL HIGH (ref 68–379)
Tissue Transglut Ab: 1 U/mL (ref <6)
Tissue Transglutaminase Ab, IgA: 1 U/mL (ref <4)

## 2014-01-10 ENCOUNTER — Other Ambulatory Visit: Payer: Self-pay | Admitting: Physician Assistant

## 2014-01-10 ENCOUNTER — Other Ambulatory Visit

## 2014-01-10 DIAGNOSIS — R197 Diarrhea, unspecified: Secondary | ICD-10-CM

## 2014-01-11 ENCOUNTER — Encounter: Payer: Self-pay | Admitting: Physician Assistant

## 2014-01-11 LAB — FECAL LACTOFERRIN, QUANT: Lactoferrin: NEGATIVE

## 2014-01-13 LAB — FECAL FAT, QUALITATIVE: Fecal Fat Qualitative: NORMAL

## 2014-01-21 ENCOUNTER — Encounter: Payer: Self-pay | Admitting: *Deleted

## 2014-01-21 ENCOUNTER — Other Ambulatory Visit: Payer: Self-pay | Admitting: *Deleted

## 2014-01-21 DIAGNOSIS — R197 Diarrhea, unspecified: Secondary | ICD-10-CM

## 2014-01-21 LAB — PANCREATIC ELASTASE, FECAL: Pancreatic Elastase-1, Stool: 166

## 2014-01-25 ENCOUNTER — Other Ambulatory Visit

## 2014-01-25 ENCOUNTER — Encounter: Payer: Self-pay | Admitting: Gastroenterology

## 2014-01-25 ENCOUNTER — Ambulatory Visit (INDEPENDENT_AMBULATORY_CARE_PROVIDER_SITE_OTHER)
Admission: RE | Admit: 2014-01-25 | Discharge: 2014-01-25 | Disposition: A | Discharge status: Home / Self Care | Source: Ambulatory Visit | Attending: Physician Assistant | Admitting: Physician Assistant

## 2014-01-25 DIAGNOSIS — R197 Diarrhea, unspecified: Secondary | ICD-10-CM

## 2014-01-25 HISTORY — DX: Diverticulosis of intestine, part unspecified, without perforation or abscess without bleeding: K57.90

## 2014-01-25 MED ORDER — IOHEXOL 300 MG/ML  SOLN
100.0000 mL | Freq: Once | INTRAMUSCULAR | Status: AC | PRN
  Administered 2014-01-25: 100 mL via INTRAVENOUS

## 2014-01-26 ENCOUNTER — Telehealth: Payer: Self-pay | Admitting: Physician Assistant

## 2014-01-26 ENCOUNTER — Ambulatory Visit (INDEPENDENT_AMBULATORY_CARE_PROVIDER_SITE_OTHER): Admitting: Physician Assistant

## 2014-01-26 ENCOUNTER — Encounter: Payer: Self-pay | Admitting: Physician Assistant

## 2014-01-26 VITALS — BP 126/70 | HR 77 | Ht 72.0 in | Wt 264.4 lb

## 2014-01-26 DIAGNOSIS — K8689 Other specified diseases of pancreas: Secondary | ICD-10-CM

## 2014-01-26 DIAGNOSIS — R197 Diarrhea, unspecified: Secondary | ICD-10-CM

## 2014-01-26 DIAGNOSIS — K868 Other specified diseases of pancreas: Secondary | ICD-10-CM

## 2014-01-26 MED ORDER — PANCRELIPASE (LIP-PROT-AMYL) 3000-9500 UNITS PO CPEP: 1.0000 | ORAL_CAPSULE | Freq: Two times a day (BID) | ORAL | Status: DC

## 2014-01-26 NOTE — Progress Notes (Signed)
Patient ID: Raymond Herring, male   DOB: 10-17-1950, 64 y.o.   MRN: [id number]     History of Present Illness:   Raymond Herring is a 64 year old male who is here today for follow-up of his diarrhea. He was seen here on December 23 with diarrhea of 6 months duration. He had a colonoscopy on 05/10/2013 at which time he was noted to have a 1.5 cm lipoma in the transverse colon. He had numerous diverticuli in the left colon however the exam was otherwise normal. Since that time. He reports that he's had diarrhea on a daily basis with 4-6 watery bowel movements after each meal. At his last visit stool cultures were obtained which were negative. Fecal lactoferrin was negative but pancreatic elastase was low suggesting pancreatic insufficiency. Due to this finding, a CT scan was obtained to evaluate for chronic pancreatitis and was nonrevealing. At his last visit he was advised to use Benefiber to bulk his stools and was given a trial of Levsin. Since his last visit he continues to have a bowel movement after every meal but his stools are now mushy not watery. He has no abdominal pain. He has no bright red blood per rectum or melena.   Past Medical History  Diagnosis Date  . Anxiety   . Arthritis   . Diabetes mellitus   . Hyperlipidemia   . Neuropathy     feet and fingers  . Diverticulosis     Past Surgical History  Procedure Laterality Date  . Knee cartilage surgery Bilateral [id number]    right and left knee  . 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 Laparoscopic appendectomy  2014   Family History  Problem Relation Age of Onset  . Heart disease Father     Pacemaker  . CVA Father   . Alzheimer's disease Father   . Colon cancer Neg Hx    History  Substance Use Topics  . Smoking status: Former Smoker -- 2.00 packs/day for 25 years    Types: Cigarettes    Quit date:  01/24/1981  . Smokeless tobacco: Never Used  . Alcohol Use: 21.0 oz/week    35 Cans of beer per week     Comment: 5-6 beers per day   Current Outpatient Prescriptions  Medication Sig Dispense Refill  . Ascorbic Acid (VITAMIN C) 1000 MG tablet Take 1,000 mg by mouth daily.     Marland Kitchen aspirin 81 MG tablet Take 81 mg by mouth daily.    . Canagliflozin (INVOKANA) 300 MG TABS Take 1 tablet by mouth daily.    . carisoprodol (SOMA) 250 MG tablet Take 250 mg by mouth as needed.     . celecoxib (CELEBREX) 200 MG capsule Take 200 mg by mouth 2 (two) times daily.    . cilostazol (PLETAL) 100 MG tablet Take 100 mg by mouth 2 (two) times daily.    . Cyanocobalamin (VITAMIN B-12 CR PO) Take 1 tablet by mouth daily.     Marland Kitchen gabapentin (NEURONTIN) 300 MG capsule Take 300 mg by mouth 2 (two) times daily.     . hyoscyamine (LEVSIN SL) 0.125 MG SL tablet Place 1 tablet (0.125 mg total) under the tongue 3 (three) times daily. 90 tablet 2  . losartan (COZAAR) 100 MG tablet Take 100 mg by mouth daily.    . Misc Natural Products (OSTEO BI-FLEX ADV TRIPLE  ST PO) Take 1 tablet by mouth daily.     . Multiple Vitamins-Minerals (CENTRUM SILVER PO) Take 1 tablet by mouth daily.     . pravastatin (PRAVACHOL) 20 MG tablet Take 20 mg by mouth daily.    . propranolol (INDERAL) 10 MG tablet Take 10 mg by mouth 2 (two) times daily.     . sitaGLIPtin (JANUVIA) 100 MG tablet Take 100 mg by mouth daily.    . tamsulosin (FLOMAX) 0.4 MG CAPS capsule Take 0.4 mg by mouth daily.    . Pancrelipase, Lip-Prot-Amyl, (CREON) 3000-9500 UNITS CPEP Take 1 capsule by mouth 2 (two) times daily with a meal. And snacks 150 capsule 2   No current facility-administered medications for this visit.   No Known Allergies    Review of Systems: Gen: Denies any fever, chills, sweats, anorexia, fatigue, weakness, malaise, weight loss, and sleep disorder CV: Denies chest pain, angina, palpitations, syncope, orthopnea, PND, peripheral edema, and  claudication. Resp: Denies dyspnea at rest, dyspnea with exercise, cough, sputum, wheezing, coughing up blood, and pleurisy. GI: Denies vomiting blood, jaundice, and fecal incontinence.   Denies dysphagia or odynophagia. GU : Denies urinary burning, blood in urine, urinary frequency, urinary hesitancy, nocturnal urination, and urinary incontinence. MS: Denies joint pain, limitation of movement, and swelling, stiffness, low back pain, extremity pain. Denies muscle weakness, cramps, atrophy.  Derm: Denies rash, itching, dry skin, hives, moles, warts, or unhealing ulcers.  Psych: Denies depression, anxiety, memory loss, suicidal ideation, hallucinations, paranoia, and confusion. Heme: Denies bruising, bleeding, and enlarged lymph nodes. Neuro:  Denies any headaches, dizziness, paresthesia Endo:  Denies any problems with DM, thyroid, adrenal  LAB RESULTS:  Fecal lactoferrin on December 28 was negative Stool pancreatic elastase on December 28 was 166 consistent with moderate pancreatic insufficiency. CBC on December 23 had white blood cell count 6.2, hemoglobin 15.7, hematocrit 46, platelets 191,000, MCV 103.9. Celiac panel on December 23 was negative TSH on December 23 was 1.37 Studies:   Ct Abdomen Pelvis W Contrast  01/25/2014   CLINICAL DATA:  Diarrhea, low pancreatic elastase question chronic pancreatitis  EXAM: CT ABDOMEN AND PELVIS WITH CONTRAST  TECHNIQUE: Multidetector CT imaging of the abdomen and pelvis was performed using the standard protocol following bolus administration of intravenous contrast. Sagittal and coronal MPR images reconstructed from axial data set.  CONTRAST:  136mL OMNIPAQUE IOHEXOL 300 MG/ML SOLN IV. Dilute oral contrast.  COMPARISON:  None  FINDINGS: Lung bases clear.  Cyst at posterior aspect upper pole LEFT kidney 2.4 x 2.2 cm image 22.  Liver, spleen, kidneys, and adrenal glands otherwise normal appearance.  Pancreas morphologically normal size and morphology without  mass, calcification, ductal dilatation or cyst.  Minimal dependent density in gallbladder cannot exclude tiny dependent calculi.  Appendix surgically absent.  Descending and sigmoid diverticulosis without definite evidence of diverticulitis.  Stomach and bowel loops otherwise normal appearance.  Significant atherosclerotic calcification at proximal LEFT renal artery.  Bladder and ureters normal appearance.  No mass, adenopathy, free fluid, free air or inflammatory process.  Degenerative disc disease changes L5-S1 without acute osseous findings.  IMPRESSION: Unremarkable CT appearance of the pancreas.  Distal colonic diverticulosis without evidence of diverticulitis.  Small LEFT renal cyst.  Cannot exclude tiny dependent calculi within gallbladder.   Electronically Signed   By: Lavonia Dana M.D.   On: 01/25/2014 18:08   PROCEDURES: Colonoscopy 05/10/13: ENDOSCOPIC IMPRESSION: There was a typical appearing 1.5cm soft, yellow lipoma in transverse segment. There were numerous diverticulum throughout  the left colon. The examination was otherwise normal. No polyps or cancers. RECOMMENDATIONS: You should continue to follow colorectal cancer screening guidelines for "routine risk" patients with a repeat colonoscopy in 10 years. There is no need for FOBT (stool) testing for at least 5 years.  Physical Exam: General: Pleasant, well developed male in no acute distress Head: Normocephalic and atraumatic Eyes:  sclerae anicteric, conjunctiva pink  Ears: Normal auditory acuity Lungs: Clear throughout to auscultation Heart: Regular rate and rhythm Abdomen: Soft, non distended, non-tender. No masses, no hepatomegaly. Normal bowel sounds Musculoskeletal: Symmetrical with no gross deformities  Extremities: No edema  Neurological: Alert oriented x 4, grossly nonfocal Psychological:  Alert and cooperative. Normal mood and affect  Assessment and Recommendations: 64 year old male with a history of diverticular  disease now with 6 months of postprandial diarrhea. Laboratory evaluation thus far has revealed some moderate pancreatic insufficiency. He's been advised to decrease his alcohol intake and will be given a trial of Creon before meals and with snacks. He's been instructed to call us in 7-10 days and let us know if it is helping. He will return for reevaluation in 4-6 weeks if no improvement noted at that time, he will be considered for repeat colonoscopy to obtain biopsies for possible microscopic or collagenous colitis.    Raymond Herring, Deloris Ping 01/26/2014,

## 2014-01-26 NOTE — Progress Notes (Signed)
i agree with the note, plan above 

## 2014-01-26 NOTE — Patient Instructions (Addendum)
Your prescription will be sent to your pharmacy today Your follow up appointment with Dr Ardis Hughs is scheduled on 03/14/2014 at 3:45pm

## 2014-01-26 NOTE — Telephone Encounter (Signed)
Creon sent to [facility]

## 2014-02-03 ENCOUNTER — Ambulatory Visit (INDEPENDENT_AMBULATORY_CARE_PROVIDER_SITE_OTHER): Admitting: Podiatry

## 2014-02-03 DIAGNOSIS — B351 Tinea unguium: Secondary | ICD-10-CM

## 2014-02-03 DIAGNOSIS — M79676 Pain in unspecified toe(s): Secondary | ICD-10-CM

## 2014-02-03 NOTE — Progress Notes (Signed)
Presents today chief complaint of painful elongated toenails.  Objective: Pulses are palpable bilateral nails are thick, yellow dystrophic onychomycosis and painful palpation.   Assessment: Onychomycosis with pain in limb.  Plan: Treatment of nails in thickness and length as covered service secondary to pain.  

## 2014-02-21 ENCOUNTER — Ambulatory Visit (INDEPENDENT_AMBULATORY_CARE_PROVIDER_SITE_OTHER): Admitting: Physician Assistant

## 2014-02-21 ENCOUNTER — Encounter: Payer: Self-pay | Admitting: Physician Assistant

## 2014-02-21 VITALS — BP 130/60 | HR 92 | Ht 70.75 in | Wt 267.2 lb

## 2014-02-21 DIAGNOSIS — K8689 Other specified diseases of pancreas: Secondary | ICD-10-CM

## 2014-02-21 DIAGNOSIS — K868 Other specified diseases of pancreas: Secondary | ICD-10-CM

## 2014-02-21 MED ORDER — PANCRELIPASE (LIP-PROT-AMYL) 36000-114000 UNITS PO CPEP: ORAL_CAPSULE | ORAL | Status: DC

## 2014-02-21 NOTE — Progress Notes (Signed)
i agree with the above note, plan.  Glad he is feeling a bit better

## 2014-02-21 NOTE — Patient Instructions (Signed)
We have sent the following medications to your pharmacy for you to pick up at your convenience: Creon, Cecille Rubin is increasing your dosage.  Follow up with Dr Ardis Hughs in 2 months, appointment made for April 4th at 9:15AM.    I appreciate the opportunity to care for you.

## 2014-02-21 NOTE — Progress Notes (Signed)
Patient ID: Raymond Herring, male   DOB: [birth year], 64 y.o.   MRN: [id number]     History of Present Illness:    Raymond Herring is a 64 year old male who was last seen here January 13. He had been evaluated in December with diarrhea of 6 months duration. He had a colonoscopy in April 2015 at which time he was noted to have 1.5 cm lipoma in the transverse colon. Since the time of his colonoscopy he reported that he was having diarrhea on a daily basis with watery bowel movements approximately 6 times after each meal stool cultures were negative fecal lactoferrin was negative but pancreatic elastase was low suggesting pancreatic insufficiency a subsequent CT was obtained and was nonrevealing at his last visit he was given a trial of Creon before meals and with stat snacks. He returns today for reevaluation overall he is feeling much better he is having one bowel movement after each meal and it is formed. He has much less urgency he does have some bloating though.   Past Medical History  Diagnosis Date  . Anxiety   . Arthritis   . Diabetes mellitus   . Hyperlipidemia   . Neuropathy     feet and fingers  . Diverticulosis     Past Surgical History  Procedure Laterality Date  . Knee cartilage surgery Bilateral [id number]    right and left knee  . 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 Laparoscopic appendectomy  2014   Family History  Problem Relation Age of Onset  . Heart disease Father     Pacemaker  . CVA Father   . Alzheimer's disease Father   . Colon cancer Neg Hx    History  Substance Use Topics  . Smoking status: Former Smoker -- 2.00 packs/day for 25 years    Types: Cigarettes    Quit date: 01/24/1981  . Smokeless tobacco: Never Used  . Alcohol Use: 21.0 oz/week    35 Cans of beer per week     Comment: 5-6 beers per day   Current Outpatient  Prescriptions  Medication Sig Dispense Refill  . Ascorbic Acid (VITAMIN C) 1000 MG tablet Take 1,000 mg by mouth daily.     Marland Kitchen aspirin 81 MG tablet Take 81 mg by mouth daily.    . Canagliflozin (INVOKANA) 300 MG TABS Take 1 tablet by mouth daily.    . carisoprodol (SOMA) 250 MG tablet Take 250 mg by mouth as needed.     . celecoxib (CELEBREX) 200 MG capsule Take 200 mg by mouth 2 (two) times daily.    . cilostazol (PLETAL) 100 MG tablet Take 100 mg by mouth 2 (two) times daily.    . Cyanocobalamin (VITAMIN B-12 CR PO) Take 1 tablet by mouth daily.     Marland Kitchen gabapentin (NEURONTIN) 300 MG capsule Take 300 mg by mouth 2 (two) times daily.     . hyoscyamine (LEVSIN SL) 0.125 MG SL tablet Place 1 tablet (0.125 mg total) under the tongue 3 (three) times daily. 90 tablet 2  . losartan (COZAAR) 100 MG tablet Take 100 mg by mouth daily.    . Misc Natural Products (OSTEO BI-FLEX ADV TRIPLE ST PO) Take 1 tablet by mouth daily.     . Multiple Vitamins-Minerals (CENTRUM SILVER PO) Take 1 tablet by mouth daily.     . pravastatin (PRAVACHOL) 20  MG tablet Take 20 mg by mouth daily.    . propranolol (INDERAL) 10 MG tablet Take 10 mg by mouth 2 (two) times daily.     . sitaGLIPtin (JANUVIA) 100 MG tablet Take 100 mg by mouth daily.    . tamsulosin (FLOMAX) 0.4 MG CAPS capsule Take 0.4 mg by mouth daily.    Marland Kitchen zolpidem (AMBIEN) 10 MG tablet Take 10 mg by mouth as needed.     . lipase/protease/amylase (CREON) 36000 UNITS CPEP capsule Take 4 capsules with each meal and 1 with each snack.  Max of 14 capsules a day. 420 capsule 3   No current facility-administered medications for this visit.   No Known Allergies    Review of Systems: Gen: Denies any fever, chills, sweats, anorexia, fatigue, weakness, malaise, weight loss, and sleep disorder CV: Denies chest pain, angina, palpitations, syncope, orthopnea, PND, peripheral edema, and claudication. Resp: Denies dyspnea at rest, dyspnea with exercise, cough, sputum,  wheezing, coughing up blood, and pleurisy. GI: Denies vomiting blood, jaundice, and fecal incontinence.   Denies dysphagia or odynophagia. GU : Denies urinary burning, blood in urine, urinary frequency, urinary hesitancy, nocturnal urination, and urinary incontinence. MS: Denies joint pain, limitation of movement, and swelling, stiffness, low back pain, extremity pain. Denies muscle weakness, cramps, atrophy.  Derm: Denies rash, itching, dry skin, hives, moles, warts, or unhealing ulcers.  Psych: Denies depression, anxiety, memory loss, suicidal ideation, hallucinations, paranoia, and confusion. Heme: Denies bruising, bleeding, and enlarged lymph nodes. Neuro:  Denies any headaches, dizziness, paresthesia Endo:  Denies any problems with DM, thyroid, adrenal      Physical Exam: General: Pleasant, well developed male in no acute distress Head: Normocephalic and atraumatic Eyes:  sclerae anicteric, conjunctiva pink  Ears: Normal auditory acuity Lungs: Clear throughout to auscultation Heart: Regular rate and rhythm Abdomen: Soft, non distended, non-tender. No masses, no hepatomegaly. Normal bowel sounds Musculoskeletal: Symmetrical with no gross deformities  Extremities: No edema  Neurological: Alert oriented x 4, grossly nonfocal Psychological:  Alert and cooperative. Normal mood and affect  Assessment and Recommendations:  64 year old male with a an 8 month history of postprandial diarrhea with laboratory studies suggestive of moderate pancreatic insufficiency. He has noted some improvement with the addition of pain pancreatic enzymes. At this point we will increase his dose of Creon to 72,000 lipase units for capsules with each meal and one capsule with snacks. He will follow up in 2 months to assess response, sooner if needed.       Margaretmary Prisk, Vita Barley PA-C 02/21/2014,

## 2014-03-09 ENCOUNTER — Encounter: Attending: Internal Medicine | Admitting: Dietician

## 2014-03-09 VITALS — Wt 266.9 lb

## 2014-03-09 DIAGNOSIS — Z713 Dietary counseling and surveillance: Secondary | ICD-10-CM | POA: Insufficient documentation

## 2014-03-09 DIAGNOSIS — E119 Type 2 diabetes mellitus without complications: Secondary | ICD-10-CM | POA: Diagnosis not present

## 2014-03-09 NOTE — Progress Notes (Signed)
  Medical Nutrition Therapy:  Appt start time: 800 end time:  830   Follow up:  Raymond Herring returns having maintained his weight since last visit. He lost his mother recently. Also having issues with his pancreas and diverticulosis and has diarrhea every time he eats. Eating a very high-fiber diet. Restarted physical therapy for his knees. Has been cutting back on beer. His kitchen has also flooded recently.   Preferred Learning Style:  No preference indicated   Learning Readiness:   Ready   MEDICATIONS: see list   DIETARY INTAKE:  Usual eating pattern includes 3 meals and 0 snacks per day.  Avoided foods include liver, squash.    Does not like sweets.  24-hr recall:  B ( AM): large bowl of whole grain cereal (multigrain cheerios and raisin bran with blueberries and 2% milk OR [employer] breakfast bowl Snk ( AM):   L ( PM): varies, sometimes skips; McDonald's burger or peanut butter and banana sandwich (1-2 sandwiches) or soup Snk ( PM):  D ( PM): baked potato with butter and sour cream OR lasagna OR [employer] roast beef Snk ( PM):   Beverages: 4-6 beers a night sometimes, 2% milk, propel  Estimated energy needs: 1800-2000 calories 200-225 g carbohydrates  Progress Towards Goal(s):  In progress.   Nutritional Diagnosis:  [place]-Laager-2.2 Altered nutrition-related laboratory As related to obesity and excessive carbohydrate intake.  As evidenced by HgbA1c 6.6%.    Intervention:  Nutrition counseling provided. Identified carbohydrate-containing foods. Reviewed carbohydrate counting and recommended 3 to 4 servings of carbs at meals. Encouraged patient to include protein with each meal and snack.  Teaching Method Utilized:  Visual Auditory Hands on  Handouts given during visit include:  Meal planning card  MyPlate  Barriers to learning/adherence to lifestyle change: none  Demonstrated degree of understanding via:  Teach Back   Monitoring/Evaluation:  Dietary intake, exercise,  HgbA1c, and body weight in 3 month(s).

## 2014-03-09 NOTE — Patient Instructions (Addendum)
-  Work on pre planning meals  -Try steamer bags of vegetables   -Eat at least 3x a day -Have a protein food every time you eat  -Get back into exercise routine

## 2014-03-14 ENCOUNTER — Ambulatory Visit: Admitting: Gastroenterology

## 2014-03-24 ENCOUNTER — Telehealth: Payer: Self-pay | Admitting: Physician Assistant

## 2014-03-24 DIAGNOSIS — K8689 Other specified diseases of pancreas: Secondary | ICD-10-CM

## 2014-03-24 NOTE — Telephone Encounter (Signed)
Patient states he is having joint pain. He as read that Creon can cause this and is wondering if the dose can be adjusted or if something else could be done for joint pain that makes it hurt to walk. He also reports he is still having diarrhea in AM. Please, advise.

## 2014-03-24 NOTE — Telephone Encounter (Signed)
Patient states the joints aching are feet, ankles, hip and legs. He has a hx of bad knees and back pain that requires injections. He has had right knee replacement. The joint pain seems worse on Creon. No swelling or redness. He states he has some joint pain in fingers also.

## 2014-03-24 NOTE — Telephone Encounter (Signed)
Patient notified of recommendations. Labs in EPIC.

## 2014-03-24 NOTE — Telephone Encounter (Signed)
Which joints hurt? Ant swelling of joints or redness of joints?

## 2014-03-24 NOTE — Telephone Encounter (Signed)
Raymond Herring, Have him stop creon. Please have him get a uric acid, cbc tomorrow.

## 2014-03-25 ENCOUNTER — Other Ambulatory Visit (INDEPENDENT_AMBULATORY_CARE_PROVIDER_SITE_OTHER)

## 2014-03-25 DIAGNOSIS — K868 Other specified diseases of pancreas: Secondary | ICD-10-CM

## 2014-03-25 DIAGNOSIS — K8689 Other specified diseases of pancreas: Secondary | ICD-10-CM

## 2014-03-25 LAB — CBC WITH DIFFERENTIAL/PLATELET
Basophils Absolute: 0 10*3/uL (ref 0.0–0.1)
Basophils Relative: 0.4 % (ref 0.0–3.0)
Eosinophils Absolute: 0.1 10*3/uL (ref 0.0–0.7)
Eosinophils Relative: 2.7 % (ref 0.0–5.0)
HCT: 42.7 % (ref 39.0–52.0)
Hemoglobin: 14.8 g/dL (ref 13.0–17.0)
Lymphocytes Relative: 28.9 % (ref 12.0–46.0)
Lymphs Abs: 1.5 10*3/uL (ref 0.7–4.0)
MCHC: 34.6 g/dL (ref 30.0–36.0)
MCV: 98.2 fl (ref 78.0–100.0)
Monocytes Absolute: 0.6 10*3/uL (ref 0.1–1.0)
Monocytes Relative: 12.5 % — ABNORMAL HIGH (ref 3.0–12.0)
Neutro Abs: 2.9 10*3/uL (ref 1.4–7.7)
Neutrophils Relative %: 55.5 % (ref 43.0–77.0)
Platelets: 213 10*3/uL (ref 150.0–400.0)
RBC: 4.35 Mil/uL (ref 4.22–5.81)
RDW: 12.3 % (ref 11.5–15.5)
WBC: 5.1 10*3/uL (ref 4.0–10.5)

## 2014-03-25 LAB — URIC ACID: Uric Acid, Serum: 5.3 mg/dL (ref 4.0–7.8)

## 2014-03-29 ENCOUNTER — Other Ambulatory Visit: Payer: Self-pay | Admitting: Physician Assistant

## 2014-04-18 ENCOUNTER — Ambulatory Visit (INDEPENDENT_AMBULATORY_CARE_PROVIDER_SITE_OTHER): Admitting: Gastroenterology

## 2014-04-18 ENCOUNTER — Encounter: Payer: Self-pay | Admitting: Gastroenterology

## 2014-04-18 VITALS — BP 110/60 | HR 76 | Ht 70.75 in | Wt 266.2 lb

## 2014-04-18 DIAGNOSIS — R197 Diarrhea, unspecified: Secondary | ICD-10-CM

## 2014-04-18 NOTE — Patient Instructions (Signed)
Stay on imodium 1-2 pills once daily. Call if your symptoms worsen. Cutting back on daily beers may help your loose stools as well.

## 2014-04-18 NOTE — Progress Notes (Signed)
Review of pertinent gastrointestinal problems: 1. Routine risk for colon cancer; colonsocopy 2015 Ardis Hughs found lipoma in transverse colon, no polyps.  Recommeneded recall at 10 years. 2. Chronic loose stools; labs 2015 TSH normal. GI path panel (not sent), fecal fat negative, pancreatic elastase 166 (slightly low); lactoferrin normal. CT scan 01/2014 showed normal pancreas.  2-4 beers daily may contribute  HPI: This is a  very pleasant 64 year old man whom I last saw about a year ago. He has recently been seen several times in the office by extender Cecille Rubin.  Creon made his gout pains worse.  He stopped it and then started imodium 1 pill twice daily and he feels absolutely fine.  Drinks rare caffeine.  Will drink 2-6 beers daily.  Overall his weight has been stable.  Past Medical History  Diagnosis Date  . Anxiety   . Arthritis   . Diabetes mellitus   . Hyperlipidemia   . Neuropathy     feet and fingers  . Diverticulosis     Past Surgical History  Procedure Laterality Date  . Knee cartilage surgery Bilateral [id number]    right and left knee  . 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 Laparoscopic appendectomy  2014    Current Outpatient Prescriptions  Medication Sig Dispense Refill  . Ascorbic Acid (VITAMIN C) 1000 MG tablet Take 1,000 mg by mouth daily.     Marland Kitchen aspirin 81 MG tablet Take 81 mg by mouth daily.    . Canagliflozin (INVOKANA) 300 MG TABS Take 1 tablet by mouth daily.    . carisoprodol (SOMA) 250 MG tablet Take 250 mg by mouth as needed.     . celecoxib (CELEBREX) 200 MG capsule Take 200 mg by mouth 2 (two) times daily.    . cilostazol (PLETAL) 100 MG tablet Take 100 mg by mouth 2 (two) times daily.    . Cyanocobalamin (VITAMIN B-12 CR PO) Take 1 tablet by mouth daily.     Marland Kitchen gabapentin (NEURONTIN) 300 MG capsule Take 300 mg by mouth 2  (two) times daily.     . hyoscyamine (LEVSIN SL) 0.125 MG SL tablet PLACE 1 TABLET (0.125 MG TOTAL) UNDER THE TONGUE 3 (THREE) TIMES DAILY. 90 tablet 0  . loperamide (IMODIUM A-D) 2 MG tablet Take 2 mg by mouth daily. Pt takes 2 tablets, by mouth, every day    . losartan (COZAAR) 100 MG tablet Take 100 mg by mouth daily.    . Misc Natural Products (OSTEO BI-FLEX ADV TRIPLE ST PO) Take 1 tablet by mouth daily.     . Multiple Vitamins-Minerals (CENTRUM SILVER PO) Take 1 tablet by mouth daily.     . pravastatin (PRAVACHOL) 20 MG tablet Take 20 mg by mouth daily.    . propranolol (INDERAL) 10 MG tablet Take 10 mg by mouth 2 (two) times daily.     . sitaGLIPtin (JANUVIA) 100 MG tablet Take 100 mg by mouth daily.    . tamsulosin (FLOMAX) 0.4 MG CAPS capsule Take 0.4 mg by mouth daily.    Marland Kitchen zolpidem (AMBIEN) 10 MG tablet Take 10 mg by mouth as needed.      No current facility-administered medications for this visit.    Allergies as of 04/18/2014  . (No Known Allergies)    Family History  Problem Relation Age of Onset  . Heart disease Father  Pacemaker  . CVA Father   . Alzheimer's disease Father   . Colon cancer Neg Hx     History   Social History  . Marital Status: Married    Spouse Name: N/A  . Number of Children: 4  . Years of Education: N/A   Occupational History  . retired     Retired   Social History Main Topics  . Smoking status: Former Smoker -- 2.00 packs/day for 25 years    Types: Cigarettes    Quit date: 01/24/1981  . Smokeless tobacco: Never Used  . Alcohol Use: 21.0 oz/week    35 Cans of beer per week     Comment: 5-6 beers per day  . Drug Use: No  . Sexual Activity: Not on file   Other Topics Concern  . Not on file   Social History Narrative      Physical Exam: BP 110/60 mmHg  Pulse 76  Ht 5' 10.75" (1.797 m)  Wt 266 lb 4 oz (120.77 kg)  BMI 37.40 kg/m2 Constitutional: generally well-appearing Psychiatric: alert and oriented x3 Abdomen:  soft, nontender, nondistended, no obvious ascites, no peritoneal signs, normal bowel sounds     Assessment and plan: 64 y.o. male with chronic loose stools  He's had no weight loss, no overt bleeding. 26 beers may contribute to his symptoms. Also perhaps pancreatic insufficiency, see above. An Imodium once to twice daily is very effective for his symptoms and he will continue that. He knows to call if he has any further troubles or concerns.

## 2014-04-22 ENCOUNTER — Other Ambulatory Visit: Payer: Self-pay | Admitting: *Deleted

## 2014-04-22 ENCOUNTER — Telehealth: Payer: Self-pay | Admitting: *Deleted

## 2014-04-22 MED ORDER — HYOSCYAMINE SULFATE 0.125 MG SL SUBL: SUBLINGUAL_TABLET | SUBLINGUAL | Status: DC

## 2014-04-22 NOTE — Telephone Encounter (Signed)
We received a fax from [facility].  Lori Hvozdovic PA-C prescribed Levsin SL 0.125 mg .  Patient saw Arta Bruce PA-C on 02-2014 and saw Dr. Ardis Hughs 04-18-2014.  In Lori's absence's  out of the office, Amy Esterwood PA-C agreed to fill the 90 day supply of the generic Oscimin SL Tab. I sent it electronically to [facility] on 04-22-2014.

## 2014-05-12 ENCOUNTER — Ambulatory Visit (INDEPENDENT_AMBULATORY_CARE_PROVIDER_SITE_OTHER)

## 2014-05-12 DIAGNOSIS — B351 Tinea unguium: Secondary | ICD-10-CM

## 2014-05-12 DIAGNOSIS — M79673 Pain in unspecified foot: Secondary | ICD-10-CM

## 2014-05-17 NOTE — Progress Notes (Signed)
Presents today chief complaint of painful elongated toenails.  Objective: Pulses are palpable bilateral nails are thick, yellow dystrophic onychomycosis and painful palpation.   Assessment: Onychomycosis with pain in limb.  Plan: Treatment of nails in thickness and length as covered service secondary to pain.  

## 2014-06-16 ENCOUNTER — Other Ambulatory Visit (HOSPITAL_COMMUNITY): Payer: Self-pay | Admitting: Internal Medicine

## 2014-06-16 ENCOUNTER — Ambulatory Visit (HOSPITAL_COMMUNITY)
Admission: RE | Admit: 2014-06-16 | Discharge: 2014-06-16 | Disposition: A | Discharge status: Home / Self Care | Source: Ambulatory Visit | Attending: Vascular Surgery | Admitting: Vascular Surgery

## 2014-06-16 DIAGNOSIS — R609 Edema, unspecified: Secondary | ICD-10-CM

## 2014-06-16 DIAGNOSIS — R600 Localized edema: Secondary | ICD-10-CM | POA: Insufficient documentation

## 2014-08-18 ENCOUNTER — Encounter: Payer: Self-pay | Admitting: Podiatry

## 2014-08-18 ENCOUNTER — Ambulatory Visit (INDEPENDENT_AMBULATORY_CARE_PROVIDER_SITE_OTHER): Admitting: Podiatry

## 2014-08-18 DIAGNOSIS — B351 Tinea unguium: Secondary | ICD-10-CM | POA: Diagnosis not present

## 2014-08-18 DIAGNOSIS — M79676 Pain in unspecified toe(s): Secondary | ICD-10-CM

## 2014-08-18 NOTE — Progress Notes (Addendum)
Patient ID: Raymond Herring, male   DOB: 1950/03/28, 64 y.o.   MRN: [id number] Complaint:  Visit Type: Patient returns to my office for continued preventative foot care services. Complaint: Patient states" my nails have grown long and thick and become painful to walk and wear shoes" Patient has been diagnosed with DM with neuropathy.. The patient presents for preventative foot care services. No changes to ROS Patient says he has been under treatment for gout right ankle. He takes chochicine, celebrex and allopurinol.  Podiatric Exam: Vascular: dorsalis pedis and posterior tibial pulses are palpable bilateral. Capillary return is immediate. Temperature gradient is WNL. Skin turgor WNL  Sensorium: Normal Semmes Weinstein monofilament test. Normal tactile sensation bilaterally. Nail Exam: Pt has thick disfigured discolored nails with subungual debris noted bilateral entire nail hallux through fifth toenails Ulcer Exam: There is no evidence of ulcer or pre-ulcerative changes or infection. Orthopedic Exam: Muscle tone and strength are WNL. No limitations in general ROM. No crepitus or effusions noted. Foot type and digits show no abnormalities. Bony prominences are unremarkable. Hot painful  swollen  medial aspect right ankle.  Muscle power WNL.    Asymptomatic HAV B/L. Limitation of STJ motion right foot. Skin: No Porokeratosis. No infection or ulcers  Diagnosis:  Onychomycosis, , Pain in right toe, pain in left toes  Treatment & Plan Procedures and Treatment: Consent by patient was obtained for treatment procedures. The patient understood the discussion of treatment and procedures well. All questions were answered thoroughly reviewed. Debridement of mycotic and hypertrophic toenails, 1 through 5 bilateral and clearing of subungual debris. No ulceration, no infection noted.  Return Visit-Office Procedure: Patient instructed to return to the office for a follow up visit 3 months for continued evaluation  and treatment.

## 2014-09-12 DIAGNOSIS — M109 Gout, unspecified: Secondary | ICD-10-CM | POA: Insufficient documentation

## 2014-10-17 DIAGNOSIS — M1460 Charcot's joint, unspecified site: Secondary | ICD-10-CM | POA: Insufficient documentation

## 2014-11-17 ENCOUNTER — Encounter: Payer: Self-pay | Admitting: Podiatry

## 2014-11-17 ENCOUNTER — Ambulatory Visit (INDEPENDENT_AMBULATORY_CARE_PROVIDER_SITE_OTHER): Admitting: Podiatry

## 2014-11-17 DIAGNOSIS — M79676 Pain in unspecified toe(s): Secondary | ICD-10-CM | POA: Diagnosis not present

## 2014-11-17 DIAGNOSIS — B351 Tinea unguium: Secondary | ICD-10-CM | POA: Diagnosis not present

## 2014-11-17 NOTE — Progress Notes (Signed)
Patient ID: Raymond Herring, male   DOB: 01/13/1951, 64 y.o.   MRN: [id number] Complaint:  Visit Type: Patient returns to my office for continued preventative foot care services. Complaint: Patient states" my nails have grown long and thick and become painful to walk and wear shoes" Patient has been diagnosed with DM with neuropathy.. The patient presents for preventative foot care services. 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  He presents with cam walker right foot.  Podiatric Exam: Vascular: dorsalis pedis and posterior tibial pulses are palpable bilateral. Capillary return is immediate. Temperature gradient is WNL. Skin turgor WNL  Sensorium: Normal Semmes Weinstein monofilament test. Normal tactile sensation bilaterally. Nail Exam: Pt has thick disfigured discolored nails with subungual debris noted bilateral entire nail hallux through fifth toenails Ulcer Exam: There is no evidence of ulcer or pre-ulcerative changes or infection. Orthopedic Exam: Muscle tone and strength are WNL. No limitations in general ROM. No crepitus or effusions noted. Foot type and digits show no abnormalities. Bony prominences are unremarkable. Hot painful  swollen  medial aspect right ankle.  Muscle power WNL.    Asymptomatic HAV B/L. Limitation of STJ motion right foot. Skin: No Porokeratosis. No infection or ulcers  Diagnosis:  Onychomycosis, , Pain in right toe, pain in left toes  Treatment & Plan Procedures and Treatment: Consent by patient was obtained for treatment procedures. The patient understood the discussion of treatment and procedures well. All questions were answered thoroughly reviewed. Debridement of mycotic and hypertrophic toenails, 1 through 5 bilateral and clearing of subungual debris. No ulceration, no infection noted. Added padding to cam walker. Return Visit-Office Procedure: Patient instructed to return to the  office for a follow up visit 3 months for continued evaluation and treatment.

## 2015-01-17 DIAGNOSIS — M25512 Pain in left shoulder: Secondary | ICD-10-CM | POA: Diagnosis not present

## 2015-01-17 DIAGNOSIS — M7072 Other bursitis of hip, left hip: Secondary | ICD-10-CM | POA: Diagnosis not present

## 2015-01-17 DIAGNOSIS — M1712 Unilateral primary osteoarthritis, left knee: Secondary | ICD-10-CM | POA: Diagnosis not present

## 2015-02-10 DIAGNOSIS — E119 Type 2 diabetes mellitus without complications: Secondary | ICD-10-CM | POA: Diagnosis not present

## 2015-02-10 DIAGNOSIS — H40013 Open angle with borderline findings, low risk, bilateral: Secondary | ICD-10-CM | POA: Diagnosis not present

## 2015-02-10 DIAGNOSIS — H25013 Cortical age-related cataract, bilateral: Secondary | ICD-10-CM | POA: Diagnosis not present

## 2015-02-10 DIAGNOSIS — H2513 Age-related nuclear cataract, bilateral: Secondary | ICD-10-CM | POA: Diagnosis not present

## 2015-02-15 ENCOUNTER — Ambulatory Visit (INDEPENDENT_AMBULATORY_CARE_PROVIDER_SITE_OTHER): Payer: Medicare Other | Admitting: Podiatry

## 2015-02-15 ENCOUNTER — Encounter: Payer: Self-pay | Admitting: Podiatry

## 2015-02-15 DIAGNOSIS — M21371 Foot drop, right foot: Secondary | ICD-10-CM

## 2015-02-15 DIAGNOSIS — M79676 Pain in unspecified toe(s): Secondary | ICD-10-CM

## 2015-02-15 DIAGNOSIS — B351 Tinea unguium: Secondary | ICD-10-CM | POA: Diagnosis not present

## 2015-02-15 NOTE — Progress Notes (Signed)
Patient ID: Raymond Herring, male   DOB: 01/13/1951, 65 y.o.   MRN: [id number] Complaint:  Visit Type: Patient returns to my office for continued preventative foot care services. Complaint: Patient states" my nails have grown long and thick and become painful to walk and wear shoes" Patient has been diagnosed with DM with neuropathy.. The patient presents for preventative foot care services. No changes to ROS   Podiatric Exam: Vascular: dorsalis pedis and posterior tibial pulses are palpable bilateral. Capillary return is immediate. Temperature gradient is WNL. Skin turgor WNL  Sensorium: Normal Semmes Weinstein monofilament test. Normal tactile sensation bilaterally. Nail Exam: Pt has thick disfigured discolored nails with subungual debris noted bilateral entire nail hallux through fifth toenails Ulcer Exam: There is no evidence of ulcer or pre-ulcerative changes or infection. Orthopedic Exam: Muscle tone and strength are WNL. No limitations in general ROM. No crepitus or effusions noted. Foot type and digits show no abnormalities. Bony prominences are unremarkable. Hot painful  swollen  medial aspect right ankle.  Muscle power WNL.    Asymptomatic HAV B/L. Limitation of STJ motion right foot. Skin: No Porokeratosis. No infection or ulcers  Diagnosis:  Onychomycosis, , Pain in right toe, pain in left toes  Treatment & Plan Procedures and Treatment: Consent by patient was obtained for treatment procedures. The patient understood the discussion of treatment and procedures well. All questions were answered thoroughly reviewed. Debridement of mycotic and hypertrophic toenails, 1 through 5 bilateral and clearing of subungual debris. No ulceration, no infection noted.  Return Visit-Office Procedure: Patient instructed to return to the office for a follow up visit 3 months for continued evaluation and treatment.   Helix Louretta Tantillo DPM 

## 2015-03-03 DIAGNOSIS — M5136 Other intervertebral disc degeneration, lumbar region: Secondary | ICD-10-CM | POA: Diagnosis not present

## 2015-03-03 DIAGNOSIS — M47816 Spondylosis without myelopathy or radiculopathy, lumbar region: Secondary | ICD-10-CM | POA: Diagnosis not present

## 2015-03-06 DIAGNOSIS — E1140 Type 2 diabetes mellitus with diabetic neuropathy, unspecified: Secondary | ICD-10-CM | POA: Diagnosis not present

## 2015-03-06 DIAGNOSIS — E1149 Type 2 diabetes mellitus with other diabetic neurological complication: Secondary | ICD-10-CM | POA: Diagnosis not present

## 2015-03-06 DIAGNOSIS — M545 Low back pain: Secondary | ICD-10-CM | POA: Diagnosis not present

## 2015-03-06 DIAGNOSIS — I10 Essential (primary) hypertension: Secondary | ICD-10-CM | POA: Diagnosis not present

## 2015-03-06 DIAGNOSIS — M14679 Charcot's joint, unspecified ankle and foot: Secondary | ICD-10-CM | POA: Diagnosis not present

## 2015-03-06 DIAGNOSIS — Z6838 Body mass index (BMI) 38.0-38.9, adult: Secondary | ICD-10-CM | POA: Diagnosis not present

## 2015-03-27 DIAGNOSIS — M14671 Charcot's joint, right ankle and foot: Secondary | ICD-10-CM | POA: Diagnosis not present

## 2015-03-29 DIAGNOSIS — M5136 Other intervertebral disc degeneration, lumbar region: Secondary | ICD-10-CM | POA: Diagnosis not present

## 2015-04-10 DIAGNOSIS — G8929 Other chronic pain: Secondary | ICD-10-CM | POA: Diagnosis not present

## 2015-04-10 DIAGNOSIS — M1288 Other specific arthropathies, not elsewhere classified, other specified site: Secondary | ICD-10-CM | POA: Diagnosis not present

## 2015-04-10 DIAGNOSIS — M5136 Other intervertebral disc degeneration, lumbar region: Secondary | ICD-10-CM | POA: Diagnosis not present

## 2015-04-10 DIAGNOSIS — M545 Low back pain: Secondary | ICD-10-CM | POA: Diagnosis not present

## 2015-04-18 DIAGNOSIS — M47816 Spondylosis without myelopathy or radiculopathy, lumbar region: Secondary | ICD-10-CM | POA: Diagnosis not present

## 2015-04-30 DIAGNOSIS — W57XXXA Bitten or stung by nonvenomous insect and other nonvenomous arthropods, initial encounter: Secondary | ICD-10-CM | POA: Diagnosis not present

## 2015-04-30 DIAGNOSIS — L559 Sunburn, unspecified: Secondary | ICD-10-CM | POA: Diagnosis not present

## 2015-05-10 ENCOUNTER — Ambulatory Visit: Payer: Medicare Other | Admitting: Podiatry

## 2015-05-17 ENCOUNTER — Encounter: Payer: Self-pay | Admitting: Podiatry

## 2015-05-17 ENCOUNTER — Ambulatory Visit (INDEPENDENT_AMBULATORY_CARE_PROVIDER_SITE_OTHER): Payer: Medicare Other | Admitting: Podiatry

## 2015-05-17 DIAGNOSIS — B351 Tinea unguium: Secondary | ICD-10-CM

## 2015-05-17 DIAGNOSIS — M79676 Pain in unspecified toe(s): Secondary | ICD-10-CM | POA: Diagnosis not present

## 2015-05-17 NOTE — Progress Notes (Signed)
Patient ID: Raymond Herring, male   DOB: 05/07/1950, 65 y.o.   MRN: [id number] Complaint:  Visit Type: Patient returns to my office for continued preventative foot care services. Complaint: Patient states" my nails have grown long and thick and become painful to walk and wear shoes" Patient has been diagnosed with DM with neuropathy.. The patient presents for preventative foot care services. No changes to ROS   Podiatric Exam: Vascular: dorsalis pedis and posterior tibial pulses are palpable bilateral. Capillary return is immediate. Temperature gradient is WNL. Skin turgor WNL  Sensorium: Normal Semmes Weinstein monofilament test. Normal tactile sensation bilaterally. Nail Exam: Pt has thick disfigured discolored nails with subungual debris noted bilateral entire nail hallux through fifth toenails Ulcer Exam: There is no evidence of ulcer or pre-ulcerative changes or infection. Orthopedic Exam: Muscle tone and strength are WNL. No limitations in general ROM. No crepitus or effusions noted. Foot type and digits show no abnormalities. Bony prominences are unremarkable. Hot painful  swollen  medial aspect right ankle.  Muscle power WNL.    Asymptomatic HAV B/L. Limitation of STJ motion right foot. Skin: No Porokeratosis. No infection or ulcers  Diagnosis:  Onychomycosis, , Pain in right toe, pain in left toes  Treatment & Plan Procedures and Treatment: Consent by patient was obtained for treatment procedures. The patient understood the discussion of treatment and procedures well. All questions were answered thoroughly reviewed. Debridement of mycotic and hypertrophic toenails, 1 through 5 bilateral and clearing of subungual debris. No ulceration, no infection noted.  Return Visit-Office Procedure: Patient instructed to return to the office for a follow up visit 3 months for continued evaluation and treatment.   Hurley Sherif Millspaugh DPM 

## 2015-06-07 DIAGNOSIS — M14671 Charcot's joint, right ankle and foot: Secondary | ICD-10-CM | POA: Diagnosis not present

## 2015-06-26 DIAGNOSIS — I7389 Other specified peripheral vascular diseases: Secondary | ICD-10-CM | POA: Diagnosis not present

## 2015-06-26 DIAGNOSIS — Z6836 Body mass index (BMI) 36.0-36.9, adult: Secondary | ICD-10-CM | POA: Diagnosis not present

## 2015-06-26 DIAGNOSIS — I10 Essential (primary) hypertension: Secondary | ICD-10-CM | POA: Diagnosis not present

## 2015-06-26 DIAGNOSIS — E1151 Type 2 diabetes mellitus with diabetic peripheral angiopathy without gangrene: Secondary | ICD-10-CM | POA: Diagnosis not present

## 2015-06-26 DIAGNOSIS — E1121 Type 2 diabetes mellitus with diabetic nephropathy: Secondary | ICD-10-CM | POA: Diagnosis not present

## 2015-06-26 DIAGNOSIS — M1712 Unilateral primary osteoarthritis, left knee: Secondary | ICD-10-CM | POA: Diagnosis not present

## 2015-07-24 ENCOUNTER — Encounter: Payer: Self-pay | Admitting: Dietician

## 2015-07-24 ENCOUNTER — Encounter: Payer: Medicare Other | Attending: Internal Medicine | Admitting: Dietician

## 2015-07-24 VITALS — Ht 72.0 in | Wt 264.0 lb

## 2015-07-24 DIAGNOSIS — E782 Mixed hyperlipidemia: Secondary | ICD-10-CM | POA: Diagnosis not present

## 2015-07-24 DIAGNOSIS — E119 Type 2 diabetes mellitus without complications: Secondary | ICD-10-CM | POA: Insufficient documentation

## 2015-07-24 DIAGNOSIS — I10 Essential (primary) hypertension: Secondary | ICD-10-CM | POA: Diagnosis not present

## 2015-07-24 DIAGNOSIS — E669 Obesity, unspecified: Secondary | ICD-10-CM | POA: Diagnosis not present

## 2015-07-24 DIAGNOSIS — M1712 Unilateral primary osteoarthritis, left knee: Secondary | ICD-10-CM | POA: Diagnosis not present

## 2015-07-24 DIAGNOSIS — E118 Type 2 diabetes mellitus with unspecified complications: Secondary | ICD-10-CM

## 2015-07-24 DIAGNOSIS — Z713 Dietary counseling and surveillance: Secondary | ICD-10-CM | POA: Diagnosis not present

## 2015-07-24 NOTE — Progress Notes (Signed)
Medical Nutrition Therapy:  Appt start time: 1115 end time:  1230.  Assessment:  Primary concerns today: Patient is here alone.  He would like to lose weight and control his blood sugar.  He would like to know what to eat, portion size and increase motivation for exercise.  States that motivation is his big problem and is Angry about his diabetes.  Referred for type 2 diabetes, obesity, HTN, and hyperlipidemia.  He also has a hx of neuropathy and Charcot foot.  He states that he rarely checks his blood sugar and when he does it is 150-160 in the morning.  He also struggles with diarrhea frequently and is unable to tolerate coffee.    He lives with his wife.  His wife does not cook regularly and is a Teaching laboratory technician with little variety.  They share cooking and he does the shopping.  He is retired from [employer], [employer], and travels extensively.  Preferred Learning Style:   No preference indicated   Learning Readiness:   Ready  MEDICATIONS: see list to include Invokana, Januvia, Imodium, vitamin B-12, vitamin C, osto biflex, MVI and an occasional steroid injection   DIETARY INTAKE:  Usual eating pattern includes 2-3 meals and 1 snacks per day.  Everyday foods include meat.  Avoided foods include coffee.    24-hr recall:  B (6-8 AM): sausage and eggs (Jimmie Dean frozen) with English muffin, whipped margarine or Jimmie Dean breakfast bowl OR cereal (raisin bran, plain cheerios, or Rice Krispies with fruit  (ALWAYS whole banana and large glass of LS v-8 juice Snk ( AM): none  L ( PM): skips at times OR 2 tomato sandwiches on white bread with mayo OR 1-2 peanut butter sandwich Snk ( PM): none D (5-6 PM): roast, occasional vegetables or potatoes OR seafood, occasional sweet potato Snk ( PM): 2 cocktails (vodka with diet Mt. Dew and lime) and 2 Reds apple ale with occasional cheese crackers or  Nachos Beverages: water, regular soda or gatorade for low blood sugar, alcohol, 2%  milk occasionally  Usual physical activity: back and knee problems so currently only walking dogs (owns 7) or with traveling, hunting, and fishing.  Estimated energy needs: 1800 calories 200 g carbohydrates 135 g protein 50 g fat  Progress Towards Goal(s):  In progress.   Nutritional Diagnosis:  NB-1.1 Food and nutrition-related knowledge deficit As related to balance of carbohydrate, protein, and fat.  As evidenced by diet hx.    Intervention:  Nutrition counseling and diabetes education initiated. Discussed Carb Counting by food group as method of portion control, reading food labels, and benefits of increased activity. Also discussed basic physiology of Diabetes, target BG ranges pre and post meals, and A1c.  Discussed mindful eating.  Consider using Calorie [employer] Pace app Consider drinking less alcohol Stay as active as possible.  Consider the Y and pool exercises. Be mindful of your food choices, portions, eat slowly and stop when you are full. Consider journaling and making a meal plan prior to grocery shopping.  Aim for 3-4 Carb Choices per meal (45-60 grams) +/- 1 either way  Aim for 0-1 Carbs per snack if hungry  Include protein in moderation with your meals and snacks Consider reading food labels for Total Carbohydrate and Fat Grams of foods Consider checking BG at alternate times per day as directed by MD  Consider taking medication as directed by MD  Teaching Method Utilized:  Visual Auditory Hands on  Handouts given during visit include:  Living Well with Diabetes Carb Counting and Food Label handouts Meal Plan Card Label reading  Snack list  Barriers to learning/adherence to lifestyle change: motivation  Demonstrated degree of understanding via:  Teach Back   Monitoring/Evaluation:  Dietary intake, exercise, label reading, and body weight prn.

## 2015-07-24 NOTE — Patient Instructions (Addendum)
Consider using Calorie Edison Pace app Consider drinking less alcohol Stay as active as possible.  Consider the Y and pool exercises. Be mindful of your food choices, portions, eat slowly and stop when you are full. Consider journaling and making a meal plan prior to grocery shopping.  Aim for 3-4 Carb Choices per meal (45-60 grams) +/- 1 either way  Aim for 0-1 Carbs per snack if hungry  Include protein in moderation with your meals and snacks Consider reading food labels for Total Carbohydrate and Fat Grams of foods Consider checking BG at alternate times per day as directed by MD  Consider taking medication as directed by MD

## 2015-08-17 ENCOUNTER — Encounter: Payer: Self-pay | Admitting: Podiatry

## 2015-08-17 ENCOUNTER — Ambulatory Visit (INDEPENDENT_AMBULATORY_CARE_PROVIDER_SITE_OTHER): Payer: Medicare Other | Admitting: Podiatry

## 2015-08-17 DIAGNOSIS — M79676 Pain in unspecified toe(s): Secondary | ICD-10-CM

## 2015-08-17 DIAGNOSIS — M21371 Foot drop, right foot: Secondary | ICD-10-CM

## 2015-08-17 DIAGNOSIS — B351 Tinea unguium: Secondary | ICD-10-CM | POA: Diagnosis not present

## 2015-08-17 NOTE — Progress Notes (Signed)
Patient ID: Raymond Herring, male   DOB: 05-31-1950, 65 y.o.   MRN: [id number] Complaint:  Visit Type: Patient returns to my office for continued preventative foot care services. Complaint: Patient states" my nails have grown long and thick and become painful to walk and wear shoes" Patient has been diagnosed with DM with neuropathy.. The patient presents for preventative foot care services. No changes to ROS   Podiatric Exam: Vascular: dorsalis pedis and posterior tibial pulses are palpable bilateral. Capillary return is immediate. Temperature gradient is WNL. Skin turgor WNL  Sensorium: Normal Semmes Weinstein monofilament test. Normal tactile sensation bilaterally. Nail Exam: Pt has thick disfigured discolored nails with subungual debris noted bilateral entire nail hallux through fifth toenails Ulcer Exam: There is no evidence of ulcer or pre-ulcerative changes or infection. Orthopedic Exam: Muscle tone and strength are WNL. No limitations in general ROM. No crepitus or effusions noted. Foot type and digits show no abnormalities. Bony prominences are unremarkable. Hot painful  swollen  medial aspect right ankle.  Muscle power WNL.    Asymptomatic HAV B/L. Limitation of STJ motion right foot. Skin: No Porokeratosis. No infection or ulcers  Diagnosis:  Onychomycosis, , Pain in right toe, pain in left toes  Treatment & Plan Procedures and Treatment: Consent by patient was obtained for treatment procedures. The patient understood the discussion of treatment and procedures well. All questions were answered thoroughly reviewed. Debridement of mycotic and hypertrophic toenails, 1 through 5 bilateral and clearing of subungual debris. No ulceration, no infection noted.  Return Visit-Office Procedure: Patient instructed to return to the office for a follow up visit 3 months for continued evaluation and treatment.   Gardiner Barefoot DPM

## 2015-09-02 ENCOUNTER — Emergency Department (HOSPITAL_COMMUNITY)
Admission: EM | Admit: 2015-09-02 | Discharge: 2015-09-02 | Disposition: A | Discharge status: Home / Self Care | Payer: Medicare Other | Attending: Emergency Medicine | Admitting: Emergency Medicine

## 2015-09-02 ENCOUNTER — Encounter (HOSPITAL_COMMUNITY): Payer: Self-pay | Admitting: Emergency Medicine

## 2015-09-02 DIAGNOSIS — E860 Dehydration: Secondary | ICD-10-CM

## 2015-09-02 DIAGNOSIS — I10 Essential (primary) hypertension: Secondary | ICD-10-CM | POA: Diagnosis not present

## 2015-09-02 DIAGNOSIS — R55 Syncope and collapse: Secondary | ICD-10-CM | POA: Diagnosis not present

## 2015-09-02 DIAGNOSIS — Z7982 Long term (current) use of aspirin: Secondary | ICD-10-CM | POA: Diagnosis not present

## 2015-09-02 DIAGNOSIS — Z79899 Other long term (current) drug therapy: Secondary | ICD-10-CM | POA: Insufficient documentation

## 2015-09-02 DIAGNOSIS — Z87891 Personal history of nicotine dependence: Secondary | ICD-10-CM | POA: Diagnosis not present

## 2015-09-02 DIAGNOSIS — R42 Dizziness and giddiness: Secondary | ICD-10-CM | POA: Diagnosis present

## 2015-09-02 DIAGNOSIS — N189 Chronic kidney disease, unspecified: Secondary | ICD-10-CM

## 2015-09-02 DIAGNOSIS — E119 Type 2 diabetes mellitus without complications: Secondary | ICD-10-CM | POA: Diagnosis not present

## 2015-09-02 DIAGNOSIS — N179 Acute kidney failure, unspecified: Secondary | ICD-10-CM | POA: Insufficient documentation

## 2015-09-02 DIAGNOSIS — I129 Hypertensive chronic kidney disease with stage 1 through stage 4 chronic kidney disease, or unspecified chronic kidney disease: Secondary | ICD-10-CM | POA: Diagnosis not present

## 2015-09-02 DIAGNOSIS — R404 Transient alteration of awareness: Secondary | ICD-10-CM | POA: Diagnosis not present

## 2015-09-02 LAB — BASIC METABOLIC PANEL
Anion gap: 12 (ref 5–15)
BUN: 27 mg/dL — ABNORMAL HIGH (ref 6–20)
CO2: 21 mmol/L — ABNORMAL LOW (ref 22–32)
Calcium: 9.1 mg/dL (ref 8.9–10.3)
Chloride: 106 mmol/L (ref 101–111)
Creatinine, Ser: 1.9 mg/dL — ABNORMAL HIGH (ref 0.61–1.24)
GFR calc Af Amer: 41 mL/min — ABNORMAL LOW (ref >60)
GFR calc non Af Amer: 35 mL/min — ABNORMAL LOW (ref >60)
Glucose, Bld: 209 mg/dL — ABNORMAL HIGH (ref 65–99)
Potassium: 4 mmol/L (ref 3.5–5.1)
Sodium: 139 mmol/L (ref 135–145)

## 2015-09-02 LAB — URINALYSIS, ROUTINE W REFLEX MICROSCOPIC
Bilirubin Urine: NEGATIVE
Glucose, UA: >1000 mg/dL — AB
Hgb urine dipstick: NEGATIVE
Ketones, ur: NEGATIVE mg/dL
Leukocytes, UA: NEGATIVE
Nitrite: NEGATIVE
Protein, ur: NEGATIVE mg/dL
Specific Gravity, Urine: 1.034 — ABNORMAL HIGH (ref 1.005–1.030)
pH: 5 (ref 5.0–8.0)

## 2015-09-02 LAB — CBG MONITORING, ED: Glucose-Capillary: 201 mg/dL — ABNORMAL HIGH (ref 65–99)

## 2015-09-02 LAB — CBC
HCT: 37 % — ABNORMAL LOW (ref 39.0–52.0)
Hemoglobin: 13.1 g/dL (ref 13.0–17.0)
MCH: 34.9 pg — ABNORMAL HIGH (ref 26.0–34.0)
MCHC: 35.4 g/dL (ref 30.0–36.0)
MCV: 98.7 fL (ref 78.0–100.0)
Platelets: 174 10*3/uL (ref 150–400)
RBC: 3.75 MIL/uL — ABNORMAL LOW (ref 4.22–5.81)
RDW: 12.2 % (ref 11.5–15.5)
WBC: 8.5 10*3/uL (ref 4.0–10.5)

## 2015-09-02 LAB — URINE MICROSCOPIC-ADD ON
Bacteria, UA: NONE SEEN
WBC, UA: NONE SEEN WBC/hpf (ref 0–5)

## 2015-09-02 MED ORDER — LACTATED RINGERS IV BOLUS (SEPSIS)
1000.0000 mL | Freq: Once | INTRAVENOUS | Status: AC
  Administered 2015-09-02: 1000 mL via INTRAVENOUS

## 2015-09-02 NOTE — ED Notes (Signed)
No respiratory or acute distress noted alert and oriented x 3 steady gait noted left with family states feels better.

## 2015-09-02 NOTE — ED Triage Notes (Signed)
Per EMS, Pt from home, Pt has been outside all day and has been feeling dizzy all day as well as drinking all day but remained outside. Pt had a syncopal episode in his yard. Denies neck or back pain or any injury from that fall. A&Ox4 and ambulatory since syncopal episode. Pt received 800 mL of NS en route.

## 2015-09-02 NOTE — ED Notes (Signed)
Pt made aware of need for urine sample.  

## 2015-09-02 NOTE — Discharge Instructions (Signed)
Your Creatinine is 1.9 today compared to 1.0 on 12/2013. Your GFR was 35.  We strongly urge you to see your doctor immediately to ensure the kidney enzyme is rechecked and any meds that can insult kidney can be reviewed and discontinued. Stop any NSAIDs you are taking (mobic, indocin, celebrex, ibuprofen etc.).  Return to the ER if you faint again. Hydrate well.

## 2015-09-02 NOTE — ED Provider Notes (Signed)
[facility] DEPT Provider Note   CSN: [id number] Arrival date & time: 09/02/15  1842     History   Chief Complaint Chief Complaint  Patient presents with  . Near Syncope    HPI EMARION BABINEAU is a 65 y.o. male.  HPI Pt with hx of DM, HTN comes in after near syncopal episode. PT reports that he has been working outside all day and had profuse amount of sweating. He went home and was walking his dogs when he started feeling dizzy, lightheaded and as if curtains were coming down his eye. Pt had a fall and recalls the fall and doesn't think he ever passed out. He had no chest pain, palpitations, dib.   ROS 10 Systems reviewed and are negative for acute change except as noted in the HPI.     Past Medical History:  Diagnosis Date  . Anxiety   . Arthritis   . Diabetes mellitus   . Diverticulosis   . Hyperlipidemia   . Hypertension   . Neuropathy ([place])    feet and fingers    Patient Active Problem List   Diagnosis Date Noted  . Palpitations 08/31/2012  . Shortness of breath   . Anxiety   . Arthritis   . Primary osteoarthritis of right knee 01/30/2011  . Knee osteoarthritis 01/30/2011    Past Surgical History:  Procedure Laterality Date  . KNEE CARTILAGE SURGERY Bilateral [id number]   right and left knee  . LAPAROSCOPIC APPENDECTOMY  2014  . TOTAL KNEE ARTHROPLASTY  01/30/2011   Procedure: TOTAL KNEE ARTHROPLASTY;  Surgeon: Tobi Bastos, MD;  Location: [facility];  Service: Orthopedics;  Laterality: Right;       Home Medications    Prior to Admission medications   Medication Sig Start Date End Date Taking? Authorizing Provider  allopurinol (ZYLOPRIM) 300 MG tablet Take 300 mg by mouth daily. 06/23/14   Historical Provider, MD  Ascorbic Acid (VITAMIN C) 1000 MG tablet Take 1,000 mg by mouth daily.     Historical Provider, MD  aspirin 81 MG tablet Take 81 mg by mouth daily. Reported on 07/24/2015    Historical Provider, MD  Canagliflozin (INVOKANA) 300 MG  TABS Take 1 tablet by mouth daily.    Historical Provider, MD  carisoprodol (SOMA) 250 MG tablet Take 250 mg by mouth as needed.     Historical Provider, MD  celecoxib (CELEBREX) 200 MG capsule Take 200 mg by mouth 2 (two) times daily. Reported on 07/24/2015    Historical Provider, MD  cilostazol (PLETAL) 100 MG tablet Take 100 mg by mouth 2 (two) times daily.    Historical Provider, MD  colchicine 0.6 MG tablet TAKE 1 TABLET TWICE A DAY AS NEEDED FOR GOUT 07/13/14   Historical Provider, MD  Cyanocobalamin (VITAMIN B-12 CR PO) Take 1 tablet by mouth daily.     Historical Provider, MD  gabapentin (NEURONTIN) 300 MG capsule Take 300 mg by mouth 2 (two) times daily.     Historical Provider, MD  hyoscyamine (LEVSIN SL) 0.125 MG SL tablet PLACE 1 TABLET (0.125 MG TOTAL) UNDER THE TONGUE 3 (THREE) TIMES DAILY. 03/29/14   Lori P Hvozdovic, PA-C  hyoscyamine (LEVSIN SL) 0.125 MG SL tablet Take 1 tab under the tongue 3 times daily. 04/22/14   Amy S Esterwood, PA-C  indomethacin (INDOCIN) 25 MG capsule Take 25 mg by mouth 2 (two) times daily with a meal.    Historical Provider, MD  loperamide (IMODIUM A-D) 2 MG tablet Take  2 mg by mouth daily. Pt takes 2 tablets, by mouth, every day    Historical Provider, MD  losartan (COZAAR) 100 MG tablet Take 100 mg by mouth daily.    Historical Provider, MD  meloxicam (MOBIC) 15 MG tablet Take 15 mg by mouth daily. Reported on 07/24/2015 07/19/14   Historical Provider, MD  Misc Natural Products (OSTEO BI-FLEX ADV TRIPLE ST PO) Take 1 tablet by mouth daily.     Historical Provider, MD  Multiple Vitamins-Minerals (CENTRUM SILVER PO) Take 1 tablet by mouth daily.     Historical Provider, MD  pravastatin (PRAVACHOL) 20 MG tablet Take 20 mg by mouth daily.    Historical Provider, MD  Probiotic Product (FLORA-Q 2 PO) Take by mouth.    Historical Provider, MD  propranolol (INDERAL) 10 MG tablet Take 10 mg by mouth 2 (two) times daily.     Historical Provider, MD  sitaGLIPtin (JANUVIA)  100 MG tablet Take 100 mg by mouth daily.    Historical Provider, MD  tamsulosin (FLOMAX) 0.4 MG CAPS capsule Take 0.4 mg by mouth daily.    Historical Provider, MD  Wheat Dextrin (BENEFIBER DRINK MIX PO) Take 1 scoop by mouth daily. 1 Tlbs once daily    Historical Provider, MD  zolpidem (AMBIEN) 10 MG tablet Take 10 mg by mouth as needed.  09/13/11   Historical Provider, MD    Family History Family History  Problem Relation Age of Onset  . Heart disease Father     Pacemaker  . CVA Father   . Alzheimer's disease Father   . Colon cancer Neg Hx     Social History Social History  Substance Use Topics  . Smoking status: Former Smoker    Packs/day: 2.00    Years: 25.00    Types: Cigarettes    Quit date: 01/24/1981  . Smokeless tobacco: Never Used  . Alcohol use 21.0 oz/week    35 Cans of beer per week     Comment: 5-6 beers per day     Allergies   Review of patient's allergies indicates no known allergies.   Review of Systems Review of Systems  ROS 10 Systems reviewed and are negative for acute change except as noted in the HPI.     Physical Exam Updated Vital Signs BP 116/67 (BP Location: Left Arm)   Pulse 91   Temp 98.9 F (37.2 C) (Oral)   Resp 16   SpO2 96%   Physical Exam  Constitutional: He is oriented to person, place, and time. He appears well-developed.  HENT:  Head: Normocephalic and atraumatic.  Dry mucosa  Eyes: Conjunctivae and EOM are normal. Pupils are equal, round, and reactive to light.  Neck: Normal range of motion. Neck supple.  Cardiovascular: Normal rate, regular rhythm and normal heart sounds.   Pulmonary/Chest: Effort normal and breath sounds normal. No respiratory distress. He has no wheezes.  Abdominal: Soft. Bowel sounds are normal. He exhibits no distension. There is no tenderness. There is no rebound and no guarding.  Musculoskeletal:  Head to toe evaluation shows no hematoma, bleeding of the scalp, no facial abrasions, step offs,  crepitus, no tenderness to palpation of the bilateral upper and lower extremities, no gross deformities, no chest tenderness, no pelvic pain.   Neurological: He is alert and oriented to person, place, and time.  Skin: Skin is warm.     ED Treatments / Results  Labs (all labs ordered are listed, but only abnormal results are displayed) Labs Reviewed  CBC - Abnormal; Notable for the following:       Result Value   RBC 3.75 (*)    HCT 37.0 (*)    MCH 34.9 (*)    All other components within normal limits  CBG MONITORING, ED - Abnormal; Notable for the following:    Glucose-Capillary 201 (*)    All other components within normal limits  BASIC METABOLIC PANEL  URINALYSIS, ROUTINE W REFLEX MICROSCOPIC (NOT AT [facility])    EKG  EKG Interpretation  Date/Time:  Saturday September 02 2015 19:01:46 EDT Ventricular Rate:  94 PR Interval:    QRS Duration: 102 QT Interval:  381 QTC Calculation: 477 R Axis:   65 Text Interpretation:  Sinus rhythm Low voltage, precordial leads Borderline prolonged QT interval No acute changes Confirmed by Kathrynn Humble, MD, Thelma Comp [id number]) on 09/02/2015 8:19:18 PM       Radiology No results found.  Procedures Procedures (including critical care time)  Medications Ordered in ED Medications  lactated ringers bolus 1,000 mL (1,000 mLs Intravenous New Bag/Given 09/02/15 1944)     Initial Impression / Assessment and Plan / ED Course  I have reviewed the triage vital signs and the nursing notes.  Pertinent labs & imaging results that were available during my care of the patient were reviewed by me and considered in my medical decision making (see chart for details).  Clinical Course   Pt comes in with near syncope and fall. His musculoskeletal exam is normal. Pt appears to have had near syncope and likely orthostatic from dehydration. We will hydrate, check electrolytes.    Final Clinical Impressions(s) / ED Diagnoses   Final diagnoses:  None    New  Prescriptions New Prescriptions   No medications on file     Varney Biles, MD 09/02/15 2024

## 2015-09-02 NOTE — ED Notes (Signed)
No respiratory or acute distress noted alert and oriented  X 3 call light in reach no pain voiced.

## 2015-09-04 DIAGNOSIS — E860 Dehydration: Secondary | ICD-10-CM | POA: Diagnosis not present

## 2015-09-04 DIAGNOSIS — E1151 Type 2 diabetes mellitus with diabetic peripheral angiopathy without gangrene: Secondary | ICD-10-CM | POA: Diagnosis not present

## 2015-09-04 DIAGNOSIS — Z6836 Body mass index (BMI) 36.0-36.9, adult: Secondary | ICD-10-CM | POA: Diagnosis not present

## 2015-09-04 DIAGNOSIS — M109 Gout, unspecified: Secondary | ICD-10-CM | POA: Diagnosis not present

## 2015-09-28 DIAGNOSIS — L821 Other seborrheic keratosis: Secondary | ICD-10-CM | POA: Diagnosis not present

## 2015-09-28 DIAGNOSIS — L570 Actinic keratosis: Secondary | ICD-10-CM | POA: Diagnosis not present

## 2015-09-28 DIAGNOSIS — D485 Neoplasm of uncertain behavior of skin: Secondary | ICD-10-CM | POA: Diagnosis not present

## 2015-09-28 DIAGNOSIS — C44622 Squamous cell carcinoma of skin of right upper limb, including shoulder: Secondary | ICD-10-CM | POA: Diagnosis not present

## 2015-10-16 DIAGNOSIS — M1712 Unilateral primary osteoarthritis, left knee: Secondary | ICD-10-CM | POA: Diagnosis not present

## 2015-10-19 DIAGNOSIS — I7389 Other specified peripheral vascular diseases: Secondary | ICD-10-CM | POA: Diagnosis not present

## 2015-10-19 DIAGNOSIS — E1151 Type 2 diabetes mellitus with diabetic peripheral angiopathy without gangrene: Secondary | ICD-10-CM | POA: Diagnosis not present

## 2015-10-19 DIAGNOSIS — I10 Essential (primary) hypertension: Secondary | ICD-10-CM | POA: Diagnosis not present

## 2015-10-19 DIAGNOSIS — Z6836 Body mass index (BMI) 36.0-36.9, adult: Secondary | ICD-10-CM | POA: Diagnosis not present

## 2015-10-19 DIAGNOSIS — E1140 Type 2 diabetes mellitus with diabetic neuropathy, unspecified: Secondary | ICD-10-CM | POA: Diagnosis not present

## 2015-10-19 DIAGNOSIS — M545 Low back pain: Secondary | ICD-10-CM | POA: Diagnosis not present

## 2015-10-19 DIAGNOSIS — Z1389 Encounter for screening for other disorder: Secondary | ICD-10-CM | POA: Diagnosis not present

## 2015-10-30 DIAGNOSIS — H40013 Open angle with borderline findings, low risk, bilateral: Secondary | ICD-10-CM | POA: Diagnosis not present

## 2015-11-15 ENCOUNTER — Encounter: Payer: Self-pay | Admitting: Podiatry

## 2015-11-15 ENCOUNTER — Ambulatory Visit (INDEPENDENT_AMBULATORY_CARE_PROVIDER_SITE_OTHER): Payer: Medicare Other | Admitting: Podiatry

## 2015-11-15 VITALS — Resp 16 | Ht 70.75 in | Wt 266.0 lb

## 2015-11-15 DIAGNOSIS — M79676 Pain in unspecified toe(s): Secondary | ICD-10-CM | POA: Diagnosis not present

## 2015-11-15 DIAGNOSIS — B351 Tinea unguium: Secondary | ICD-10-CM | POA: Diagnosis not present

## 2015-11-15 DIAGNOSIS — M21371 Foot drop, right foot: Secondary | ICD-10-CM

## 2015-11-15 NOTE — Progress Notes (Signed)
Patient ID: Raymond Herring, male   DOB: [birth year], 65 y.o.   MRN: [id number] Complaint:  Visit Type: Patient returns to my office for continued preventative foot care services. Complaint: Patient states" my nails have grown long and thick and become painful to walk and wear shoes" Patient has been diagnosed with DM with neuropathy.. The patient presents for preventative foot care services. No changes to ROS   Podiatric Exam: Vascular: dorsalis pedis and posterior tibial pulses are palpable bilateral. Capillary return is immediate. Temperature gradient is WNL. Skin turgor WNL  Sensorium: Normal Semmes Weinstein monofilament test. Normal tactile sensation bilaterally. Nail Exam: Pt has thick disfigured discolored nails with subungual debris noted bilateral entire nail hallux through fifth toenails Ulcer Exam: There is no evidence of ulcer or pre-ulcerative changes or infection. Orthopedic Exam: Muscle tone and strength are WNL. No limitations in general ROM. No crepitus or effusions noted. Foot type and digits show no abnormalities. Bony prominences are unremarkable. Hot painful  swollen  medial aspect right ankle.  Muscle power WNL.    Asymptomatic HAV B/L. Limitation of STJ motion right foot. Skin: No Porokeratosis. No infection or ulcers  Diagnosis:  Onychomycosis, , Pain in right toe, pain in left toes  Treatment & Plan Procedures and Treatment: Consent by patient was obtained for treatment procedures. The patient understood the discussion of treatment and procedures well. All questions were answered thoroughly reviewed. Debridement of mycotic and hypertrophic toenails, 1 through 5 bilateral and clearing of subungual debris. No ulceration, no infection noted.  Return Visit-Office Procedure: Patient instructed to return to the office for a follow up visit 3 months for continued evaluation and treatment.   Gardiner Barefoot DPM

## 2015-11-28 DIAGNOSIS — E1151 Type 2 diabetes mellitus with diabetic peripheral angiopathy without gangrene: Secondary | ICD-10-CM | POA: Diagnosis not present

## 2015-11-28 DIAGNOSIS — M109 Gout, unspecified: Secondary | ICD-10-CM | POA: Diagnosis not present

## 2015-11-28 DIAGNOSIS — Z125 Encounter for screening for malignant neoplasm of prostate: Secondary | ICD-10-CM | POA: Diagnosis not present

## 2015-11-28 DIAGNOSIS — I10 Essential (primary) hypertension: Secondary | ICD-10-CM | POA: Diagnosis not present

## 2015-11-28 DIAGNOSIS — E784 Other hyperlipidemia: Secondary | ICD-10-CM | POA: Diagnosis not present

## 2015-12-05 DIAGNOSIS — M109 Gout, unspecified: Secondary | ICD-10-CM | POA: Diagnosis not present

## 2015-12-05 DIAGNOSIS — E668 Other obesity: Secondary | ICD-10-CM | POA: Diagnosis not present

## 2015-12-05 DIAGNOSIS — E1151 Type 2 diabetes mellitus with diabetic peripheral angiopathy without gangrene: Secondary | ICD-10-CM | POA: Diagnosis not present

## 2015-12-05 DIAGNOSIS — M545 Low back pain: Secondary | ICD-10-CM | POA: Diagnosis not present

## 2015-12-05 DIAGNOSIS — I7389 Other specified peripheral vascular diseases: Secondary | ICD-10-CM | POA: Diagnosis not present

## 2015-12-05 DIAGNOSIS — I10 Essential (primary) hypertension: Secondary | ICD-10-CM | POA: Diagnosis not present

## 2015-12-05 DIAGNOSIS — Z6833 Body mass index (BMI) 33.0-33.9, adult: Secondary | ICD-10-CM | POA: Diagnosis not present

## 2015-12-05 DIAGNOSIS — M14679 Charcot's joint, unspecified ankle and foot: Secondary | ICD-10-CM | POA: Diagnosis not present

## 2015-12-05 DIAGNOSIS — E1140 Type 2 diabetes mellitus with diabetic neuropathy, unspecified: Secondary | ICD-10-CM | POA: Diagnosis not present

## 2015-12-05 DIAGNOSIS — E784 Other hyperlipidemia: Secondary | ICD-10-CM | POA: Diagnosis not present

## 2015-12-05 DIAGNOSIS — Z0000 Encounter for general adult medical examination without abnormal findings: Secondary | ICD-10-CM | POA: Diagnosis not present

## 2015-12-05 DIAGNOSIS — M25562 Pain in left knee: Secondary | ICD-10-CM | POA: Diagnosis not present

## 2015-12-25 DIAGNOSIS — Z1212 Encounter for screening for malignant neoplasm of rectum: Secondary | ICD-10-CM | POA: Diagnosis not present

## 2015-12-29 DIAGNOSIS — M1712 Unilateral primary osteoarthritis, left knee: Secondary | ICD-10-CM | POA: Diagnosis not present

## 2016-01-01 DIAGNOSIS — M25571 Pain in right ankle and joints of right foot: Secondary | ICD-10-CM | POA: Diagnosis not present

## 2016-01-01 DIAGNOSIS — M14671 Charcot's joint, right ankle and foot: Secondary | ICD-10-CM | POA: Diagnosis not present

## 2016-01-24 ENCOUNTER — Encounter (HOSPITAL_COMMUNITY): Payer: Medicare Other

## 2016-01-24 ENCOUNTER — Ambulatory Visit (INDEPENDENT_AMBULATORY_CARE_PROVIDER_SITE_OTHER): Payer: Medicare Other | Admitting: Podiatry

## 2016-01-24 ENCOUNTER — Encounter: Payer: Self-pay | Admitting: Podiatry

## 2016-01-24 VITALS — Ht 70.75 in | Wt 266.0 lb

## 2016-01-24 DIAGNOSIS — M79676 Pain in unspecified toe(s): Secondary | ICD-10-CM

## 2016-01-24 DIAGNOSIS — B351 Tinea unguium: Secondary | ICD-10-CM | POA: Diagnosis not present

## 2016-01-24 DIAGNOSIS — M21371 Foot drop, right foot: Secondary | ICD-10-CM

## 2016-01-24 NOTE — Progress Notes (Signed)
Patient ID: Raymond Herring, male   DOB: [birth year], 66 y.o.   MRN: [id number] Complaint:  Visit Type: Patient returns to my office for continued preventative foot care services. Complaint: Patient states" my nails have grown long and thick and become painful to walk and wear shoes" Patient has been diagnosed with DM with neuropathy.. The patient presents for preventative foot care services. No changes to ROS   Podiatric Exam: Vascular: dorsalis pedis and posterior tibial pulses are palpable bilateral. Capillary return is immediate. Temperature gradient is WNL. Skin turgor WNL  Sensorium: Normal Semmes Weinstein monofilament test. Normal tactile sensation bilaterally. Nail Exam: Pt has thick disfigured discolored nails with subungual debris noted bilateral entire nail hallux through fifth toenails Ulcer Exam: There is no evidence of ulcer or pre-ulcerative changes or infection. Orthopedic Exam: Muscle tone and strength are WNL. No limitations in general ROM. No crepitus or effusions noted. Foot type and digits show no abnormalities. Bony prominences are unremarkable. Hot painful  swollen  medial aspect right ankle.  Muscle power WNL.    Asymptomatic HAV B/L. Limitation of STJ motion right foot. Skin: No Porokeratosis. No infection or ulcers  Diagnosis:  Onychomycosis, , Pain in right toe, pain in left toes  Treatment & Plan Procedures and Treatment: Consent by patient was obtained for treatment procedures. The patient understood the discussion of treatment and procedures well. All questions were answered thoroughly reviewed. Debridement of mycotic and hypertrophic toenails, 1 through 5 bilateral and clearing of subungual debris. No ulceration, no infection noted.  Return Visit-Office Procedure: Patient instructed to return to the office for a follow up visit 3 months for continued evaluation and treatment.   Gardiner Barefoot DPM

## 2016-01-26 ENCOUNTER — Encounter (HOSPITAL_COMMUNITY): Payer: Self-pay

## 2016-01-26 ENCOUNTER — Encounter (HOSPITAL_COMMUNITY)
Admission: RE | Admit: 2016-01-26 | Discharge: 2016-01-26 | Disposition: A | Discharge status: Home / Self Care | Payer: Medicare Other | Source: Ambulatory Visit | Attending: Orthopedic Surgery | Admitting: Orthopedic Surgery

## 2016-01-26 ENCOUNTER — Ambulatory Visit (HOSPITAL_COMMUNITY)
Admission: RE | Admit: 2016-01-26 | Discharge: 2016-01-26 | Disposition: A | Discharge status: Home / Self Care | Payer: Medicare Other | Source: Ambulatory Visit | Attending: Surgical | Admitting: Surgical

## 2016-01-26 DIAGNOSIS — Z01812 Encounter for preprocedural laboratory examination: Secondary | ICD-10-CM | POA: Insufficient documentation

## 2016-01-26 DIAGNOSIS — Z0183 Encounter for blood typing: Secondary | ICD-10-CM | POA: Insufficient documentation

## 2016-01-26 DIAGNOSIS — Z01818 Encounter for other preprocedural examination: Secondary | ICD-10-CM

## 2016-01-26 DIAGNOSIS — M1712 Unilateral primary osteoarthritis, left knee: Secondary | ICD-10-CM | POA: Insufficient documentation

## 2016-01-26 DIAGNOSIS — Z471 Aftercare following joint replacement surgery: Secondary | ICD-10-CM | POA: Diagnosis not present

## 2016-01-26 DIAGNOSIS — Z96659 Presence of unspecified artificial knee joint: Secondary | ICD-10-CM | POA: Diagnosis not present

## 2016-01-26 HISTORY — DX: Headache: R51

## 2016-01-26 HISTORY — DX: Headache, unspecified: R51.9

## 2016-01-26 LAB — COMPREHENSIVE METABOLIC PANEL
ALT: 25 U/L (ref 17–63)
AST: 22 U/L (ref 15–41)
Albumin: 4.7 g/dL (ref 3.5–5.0)
Alkaline Phosphatase: 63 U/L (ref 38–126)
Anion gap: 8 (ref 5–15)
BUN: 23 mg/dL — ABNORMAL HIGH (ref 6–20)
CO2: 27 mmol/L (ref 22–32)
Calcium: 10.2 mg/dL (ref 8.9–10.3)
Chloride: 104 mmol/L (ref 101–111)
Creatinine, Ser: 1 mg/dL (ref 0.61–1.24)
GFR calc Af Amer: >60 mL/min (ref >60)
GFR calc non Af Amer: >60 mL/min (ref >60)
Glucose, Bld: 108 mg/dL — ABNORMAL HIGH (ref 65–99)
Potassium: 4.3 mmol/L (ref 3.5–5.1)
Sodium: 139 mmol/L (ref 135–145)
Total Bilirubin: 1 mg/dL (ref 0.3–1.2)
Total Protein: 7.3 g/dL (ref 6.5–8.1)

## 2016-01-26 LAB — CBC WITH DIFFERENTIAL/PLATELET
Basophils Absolute: 0 10*3/uL (ref 0.0–0.1)
Basophils Relative: 0 %
Eosinophils Absolute: 0.1 10*3/uL (ref 0.0–0.7)
Eosinophils Relative: 2 %
HCT: 39.9 % (ref 39.0–52.0)
Hemoglobin: 14.1 g/dL (ref 13.0–17.0)
Lymphocytes Relative: 34 %
Lymphs Abs: 1.9 10*3/uL (ref 0.7–4.0)
MCH: 33.6 pg (ref 26.0–34.0)
MCHC: 35.3 g/dL (ref 30.0–36.0)
MCV: 95 fL (ref 78.0–100.0)
Monocytes Absolute: 0.8 10*3/uL (ref 0.1–1.0)
Monocytes Relative: 14 %
Neutro Abs: 2.8 10*3/uL (ref 1.7–7.7)
Neutrophils Relative %: 50 %
Platelets: 176 10*3/uL (ref 150–400)
RBC: 4.2 MIL/uL — ABNORMAL LOW (ref 4.22–5.81)
RDW: 13 % (ref 11.5–15.5)
WBC: 5.6 10*3/uL (ref 4.0–10.5)

## 2016-01-26 LAB — URINALYSIS, ROUTINE W REFLEX MICROSCOPIC
Bacteria, UA: NONE SEEN
Bilirubin Urine: NEGATIVE
Glucose, UA: >=500 mg/dL — AB
Hgb urine dipstick: NEGATIVE
Ketones, ur: NEGATIVE mg/dL
Leukocytes, UA: NEGATIVE
Nitrite: NEGATIVE
Protein, ur: NEGATIVE mg/dL
Specific Gravity, Urine: 1.014 (ref 1.005–1.030)
Squamous Epithelial / LPF: NONE SEEN
pH: 5 (ref 5.0–8.0)

## 2016-01-26 LAB — PROTIME-INR
INR: 0.99
Prothrombin Time: 13 seconds (ref 11.4–15.2)

## 2016-01-26 LAB — SURGICAL PCR SCREEN
MRSA, PCR: NEGATIVE
Staphylococcus aureus: NEGATIVE

## 2016-01-26 LAB — GLUCOSE, CAPILLARY: Glucose-Capillary: 117 mg/dL — ABNORMAL HIGH (ref 65–99)

## 2016-01-26 LAB — APTT: aPTT: 28 seconds (ref 24–36)

## 2016-01-26 NOTE — Patient Instructions (Signed)
Raymond Herring  01/26/2016   Your procedure is scheduled on: Wednesday 01/31/2016  Report to [facility] Main  Entrance take [facility]  elevators to 3rd floor to  [place] at  0930 AM.  Call this number if you have problems the morning of surgery [phone]   Remember: ONLY 1 PERSON MAY GO WITH YOU TO SHORT STAY TO GET  READY MORNING OF [place].     Do not eat food or drink liquids :After Midnight.     Take these medicines the morning of surgery with A SIP OF WATER: Propanolol, Gabapentin   How to Manage Your Diabetes Before and After Surgery  Why is it important to control my blood sugar before and after surgery? . Improving blood sugar levels before and after surgery helps healing and can limit problems. . A way of improving blood sugar control is eating a healthy diet by: o  Eating less sugar and carbohydrates o  Increasing activity/exercise o  Talking with your doctor about reaching your blood sugar goals . High blood sugars (greater than 180 mg/dL) can raise your risk of infections and slow your recovery, so you will need to focus on controlling your diabetes during the weeks before surgery. . Make sure that the doctor who takes care of your diabetes knows about your planned surgery including the date and location.  How do I manage my blood sugar before surgery? . Check your blood sugar at least 4 times a day, starting 2 days before surgery, to make sure that the level is not too high or low. o Check your blood sugar the morning of your surgery when you wake up and every 2 hours until you get to the Short Stay unit. . If your blood sugar is less than 70 mg/dL, you will need to treat for low blood sugar: o Do not take insulin. o Treat a low blood sugar (less than 70 mg/dL) with  cup of clear juice (cranberry or apple), 4 glucose tablets, OR glucose gel. o Recheck blood sugar in 15 minutes after treatment (to make sure it is greater than 70  mg/dL). If your blood sugar is not greater than 70 mg/dL on recheck, call [phone] for further instructions. . Report your blood sugar to the short stay nurse when you get to Short Stay.  . If you are admitted to the hospital after surgery: o Your blood sugar will be checked by the staff and you will probably be given insulin after surgery (instead of oral diabetes medicines) to make sure you have good blood sugar levels. o The goal for blood sugar control after surgery is 80-180 mg/dL.   WHAT DO I DO ABOUT MY DIABETES MEDICATION?  Marland Kitchen Do not take oral diabetes medicines (pills) the morning of surgery.   DO NOT TAKE ANY DIABETIC MEDICATIONS DAY OF YOUR SURGERY                               You may not have any metal on your body including hair pins and              piercings  Do not wear jewelry, make-up, lotions, powders or perfumes, deodorant             Do not wear nail polish.  Do not shave  48 hours prior to  surgery.              Men may shave face and neck.   Do not bring valuables to the hospital. [place].  Contacts, dentures or bridgework may not be worn into surgery.  Leave suitcase in the car. After surgery it may be brought to your room.                  Please read over the following fact sheets you were given: _____________________________________________________________________             [facility] - Preparing for Surgery Before surgery, you can play an important role.  Because skin is not sterile, your skin needs to be as free of germs as possible.  You can reduce the number of germs on your skin by washing with CHG (chlorahexidine gluconate) soap before surgery.  CHG is an antiseptic cleaner which kills germs and bonds with the skin to continue killing germs even after washing. Please DO NOT use if you have an allergy to CHG or antibacterial soaps.  If your skin becomes reddened/irritated stop using the CHG and  inform your nurse when you arrive at Short Stay. Do not shave (including legs and underarms) for at least 48 hours prior to the first CHG shower.  You may shave your face/neck. Please follow these instructions carefully:  1.  Shower with CHG Soap the night before surgery and the  morning of Surgery.  2.  If you choose to wash your hair, wash your hair first as usual with your  normal  shampoo.  3.  After you shampoo, rinse your hair and body thoroughly to remove the  shampoo.                           4.  Use CHG as you would any other liquid soap.  You can apply chg directly  to the skin and wash                       Gently with a scrungie or clean washcloth.  5.  Apply the CHG Soap to your body ONLY FROM THE NECK DOWN.   Do not use on face/ open                           Wound or open sores. Avoid contact with eyes, ears mouth and genitals (private parts).                       Wash face,  Genitals (private parts) with your normal soap.             6.  Wash thoroughly, paying special attention to the area where your surgery  will be performed.  7.  Thoroughly rinse your body with warm water from the neck down.  8.  DO NOT shower/wash with your normal soap after using and rinsing off  the CHG Soap.                9.  Pat yourself dry with a clean towel.            10.  Wear clean pajamas.            11.  Place clean sheets on  your bed the night of your first shower and do not  sleep with pets. Day of Surgery : Do not apply any lotions/deodorants the morning of surgery.  Please wear clean clothes to the hospital/surgery center.  FAILURE TO FOLLOW THESE INSTRUCTIONS MAY RESULT IN THE CANCELLATION OF YOUR SURGERY PATIENT SIGNATURE_________________________________  NURSE SIGNATURE__________________________________  ________________________________________________________________________   Adam Phenix  An incentive spirometer is a tool that can help keep your lungs clear and  active. This tool measures how well you are filling your lungs with each breath. Taking long deep breaths may help reverse or decrease the chance of developing breathing (pulmonary) problems (especially infection) following:  A long period of time when you are unable to move or be active. BEFORE THE PROCEDURE   If the spirometer includes an indicator to show your best effort, your nurse or respiratory therapist will set it to a desired goal.  If possible, sit up straight or lean slightly forward. Try not to slouch.  Hold the incentive spirometer in an upright position. INSTRUCTIONS FOR USE  1. Sit on the edge of your bed if possible, or sit up as far as you can in bed or on a chair. 2. Hold the incentive spirometer in an upright position. 3. Breathe out normally. 4. Place the mouthpiece in your mouth and seal your lips tightly around it. 5. Breathe in slowly and as deeply as possible, raising the piston or the ball toward the top of the column. 6. Hold your breath for 3-5 seconds or for as long as possible. Allow the piston or ball to fall to the bottom of the column. 7. Remove the mouthpiece from your mouth and breathe out normally. 8. Rest for a few seconds and repeat Steps 1 through 7 at least 10 times every 1-2 hours when you are awake. Take your time and take a few normal breaths between deep breaths. 9. The spirometer may include an indicator to show your best effort. Use the indicator as a goal to work toward during each repetition. 10. After each set of 10 deep breaths, practice coughing to be sure your lungs are clear. If you have an incision (the cut made at the time of surgery), support your incision when coughing by placing a pillow or rolled up towels firmly against it. Once you are able to get out of bed, walk around indoors and cough well. You may stop using the incentive spirometer when instructed by your caregiver.  RISKS AND COMPLICATIONS  Take your time so you do not get  dizzy or light-headed.  If you are in pain, you may need to take or ask for pain medication before doing incentive spirometry. It is harder to take a deep breath if you are having pain. AFTER USE  Rest and breathe slowly and easily.  It can be helpful to keep track of a log of your progress. Your caregiver can provide you with a simple table to help with this. If you are using the spirometer at home, follow these instructions: [place] IF:   You are having difficultly using the spirometer.  You have trouble using the spirometer as often as instructed.  Your pain medication is not giving enough relief while using the spirometer.  You develop fever of 100.5 F (38.1 C) or higher. SEEK IMMEDIATE MEDICAL CARE IF:   You cough up bloody sputum that had not been present before.  You develop fever of 102 F (38.9 C) or greater.  You develop worsening  pain at or near the incision site. MAKE SURE YOU:   Understand these instructions.  Will watch your condition.  Will get help right away if you are not doing well or get worse. Document Released: 05/13/2006 Document Revised: 03/25/2011 Document Reviewed: 07/14/2006 Exit[employer] Patient Information 2014 [employer], [place].   ________________________________________________________________________  WHAT IS A BLOOD TRANSFUSION? Blood Transfusion Information  A transfusion is the replacement of blood or some of its parts. Blood is made up of multiple cells which provide different functions.  Red blood cells carry oxygen and are used for blood loss replacement.  Recore blood cells fight against infection.  Platelets control bleeding.  Plasma helps clot blood.  Other blood products are available for specialized needs, such as hemophilia or other clotting disorders. BEFORE THE TRANSFUSION  Who gives blood for transfusions?   Healthy volunteers who are fully evaluated to make sure their blood is safe. This is blood bank  blood. Transfusion therapy is the safest it has ever been in the practice of medicine. Before blood is taken from a donor, a complete history is taken to make sure that person has no history of diseases nor engages in risky social behavior (examples are intravenous drug use or sexual activity with multiple partners). The donor's travel history is screened to minimize risk of transmitting infections, such as malaria. The donated blood is tested for signs of infectious diseases, such as HIV and hepatitis. The blood is then tested to be sure it is compatible with you in order to minimize the chance of a transfusion reaction. If you or a relative donates blood, this is often done in anticipation of surgery and is not appropriate for emergency situations. It takes many days to process the donated blood. RISKS AND COMPLICATIONS Although transfusion therapy is very safe and saves many lives, the main dangers of transfusion include:   Getting an infectious disease.  Developing a transfusion reaction. This is an allergic reaction to something in the blood you were given. Every precaution is taken to prevent this. The decision to have a blood transfusion has been considered carefully by your caregiver before blood is given. Blood is not given unless the benefits outweigh the risks. AFTER THE TRANSFUSION  Right after receiving a blood transfusion, you will usually feel much better and more energetic. This is especially true if your red blood cells have gotten low (anemic). The transfusion raises the level of the red blood cells which carry oxygen, and this usually causes an energy increase.  The nurse administering the transfusion will monitor you carefully for complications. HOME CARE INSTRUCTIONS  No special instructions are needed after a transfusion. You may find your energy is better. Speak with your caregiver about any limitations on activity for underlying diseases you may have. SEEK MEDICAL CARE IF:    Your condition is not improving after your transfusion.  You develop redness or irritation at the intravenous (IV) site. SEEK IMMEDIATE MEDICAL CARE IF:  Any of the following symptoms occur over the next 12 hours:  Shaking chills.  You have a temperature by mouth above 102 F (38.9 C), not controlled by medicine.  Chest, back, or muscle pain.  People around you feel you are not acting correctly or are confused.  Shortness of breath or difficulty breathing.  Dizziness and fainting.  You get a rash or develop hives.  You have a decrease in urine output.  Your urine turns a dark color or changes to pink, red, or brown. Any of the following  symptoms occur over the next 10 days:  You have a temperature by mouth above 102 F (38.9 C), not controlled by medicine.  Shortness of breath.  Weakness after normal activity.  The [place] part of the eye turns yellow (jaundice).  You have a decrease in the amount of urine or are urinating less often.  Your urine turns a dark color or changes to pink, red, or brown. Document Released: 12/29/1999 Document Revised: 03/25/2011 Document Reviewed: 08/17/2007 [facility] Patient Information 2014 [place], [place].  _______________________________________________________________________

## 2016-01-26 NOTE — H&P (Signed)
TOTAL KNEE ADMISSION H&P  Patient is being admitted for left total knee arthroplasty.  Subjective:  Chief Complaint:left knee pain.  HPI: Raymond Herring, 66 y.o. male, has a history of pain and functional disability in the left knee due to arthritis and has failed non-surgical conservative treatments for greater than 12 weeks to includeNSAID's and/or analgesics, corticosteriod injections, viscosupplementation injections, flexibility and strengthening excercises and activity modification.  Onset of symptoms was gradual, starting 5 years ago with gradually worsening course since that time. The patient noted prior procedures on the knee to include  menisectomy on the left knee(s).  Patient currently rates pain in the left knee(s) at 7 out of 10 with activity. Patient has night pain, worsening of pain with activity and weight bearing, pain that interferes with activities of daily living, pain with passive range of motion, crepitus and joint swelling.  Patient has evidence of periarticular osteophytes and joint space narrowing by imaging studies. There is no active infection.  Patient Active Problem List   Diagnosis Date Noted  . Palpitations 08/31/2012  . Shortness of breath   . Anxiety   . Arthritis   . Primary osteoarthritis of right knee 01/30/2011  . Knee osteoarthritis 01/30/2011   Past Medical History:  Diagnosis Date  . Anxiety   . Arthritis   . Diabetes mellitus   . Diverticulosis   . Hyperlipidemia   . Hypertension   . Neuropathy (HCC)    feet and fingers    Past Surgical History:  Procedure Laterality Date  . KNEE CARTILAGE SURGERY Bilateral [id number]   right and left knee  . LAPAROSCOPIC APPENDECTOMY  2014  . TOTAL KNEE ARTHROPLASTY  01/30/2011   Procedure: TOTAL KNEE ARTHROPLASTY;  Surgeon: Tobi Bastos, MD;  Location: [facility];  Service: Orthopedics;  Laterality: Right;     Current Outpatient Prescriptions:  .  allopurinol (ZYLOPRIM) 300 MG tablet, Take 300 mg by  mouth daily., Disp: , Rfl: 12 .  Ascorbic Acid (VITAMIN C PO), Take 3,000 mg by mouth daily., Disp: , Rfl:  .  Canagliflozin (INVOKANA) 300 MG TABS, Take 300 mg by mouth daily. , Disp: , Rfl:  .  cilostazol (PLETAL) 100 MG tablet, Take 100 mg by mouth 2 (two) times daily., Disp: , Rfl:  .  colchicine (COLCRYS) 0.6 MG tablet, Take 0.6 mg by mouth daily as needed (for gout)., Disp: , Rfl:  .  Cyanocobalamin (VITAMIN B-12 CR PO), Take 3,000 mcg by mouth at bedtime. , Disp: , Rfl:  .  famotidine (PEPCID) 20 MG tablet, Take 20 mg by mouth 3 (three) times daily., Disp: , Rfl:  .  gabapentin (NEURONTIN) 300 MG capsule, Take 300 mg by mouth 2 (two) times daily. , Disp: , Rfl:  .  Hypromellose 0.3 % SOLN, Place 1-2 drops into both eyes 3 (three) times daily as needed (for dry eyes). RETAINE [facility] 0.3%,  .  ibuprofen (ADVIL,MOTRIN) 800 MG tablet, Take 800 mg by mouth 3 (three) times daily., Disp: , Rfl:  .  loperamide (IMODIUM A-D) 2 MG tablet, Take 2-4 mg by mouth 4 (four) times daily as needed for diarrhea or  .  losartan (COZAAR) 100 MG tablet, Take 100 mg by mouth daily., Disp: , Rfl:  .  Misc Natural Products (OSTEO BI-FLEX ADV TRIPLE ST PO), Take 2 tablets by mouth daily. , Disp: , Rfl:  .  Multiple Vitamin (MULTIVITAMIN WITH MINERALS) TABS tablet, Take 1 tablet by mouth daily. Centrum Silver,  .  phentermine  37.5 MG capsule, Take 37.5 mg by mouth daily with breakfast., Disp: , Rfl: 2 .  pravastatin (PRAVACHOL) 20 MG tablet, Take 20 mg by mouth daily., Disp: , Rfl:  .  propranolol (INDERAL) 10 MG tablet, Take 10 mg by mouth 2 (two) times daily. , Disp: , Rfl:  .  saccharomyces boulardii (FLORASTOR) 250 MG capsule, Take 250 mg by mouth 2 (two) times daily., Disp: ,  .  sitaGLIPtin (JANUVIA) 100 MG tablet, Take 100 mg by mouth daily., Disp: , Rfl:  .  tamsulosin (FLOMAX) 0.4 MG CAPS capsule, Take 0.4 mg by mouth at bedtime. , Disp: , Rfl:  .  Wheat Dextrin (BENEFIBER DRINK MIX PO), Take 15 mLs by mouth  daily. 1 Tlbs once daily , Disp: , Rfl:  .  zolpidem (AMBIEN) 10 MG tablet, Take 10 mg by mouth at bedtime as needed for sleep. , Disp: , Rfl:   No Known Allergies  Social History  Substance Use Topics  . Smoking status: Former Smoker    Packs/day: 2.00    Years: 25.00    Types: Cigarettes    Quit date: 01/24/1981  . Smokeless tobacco: Never Used  . Alcohol use 21.0 oz/week    35 Cans of beer per week     Comment: 5-6 beers per day    Family History  Problem Relation Age of Onset  . Heart disease Father     Pacemaker  . CVA Father   . Alzheimer's disease Father   . Colon cancer Neg Hx      Review of Systems  Constitutional: Negative.   HENT: Negative.   Eyes: Negative.   Respiratory: Negative.   Cardiovascular: Negative.   Gastrointestinal: Positive for diarrhea. Negative for abdominal pain, blood in stool, constipation, heartburn, melena and nausea.  Genitourinary: Positive for frequency. Negative for dysuria, flank pain, hematuria and urgency.  Musculoskeletal: Positive for back pain, joint pain and myalgias. Negative for falls and neck pain.  Skin: Negative.   Neurological: Negative.   Endo/Heme/Allergies: Negative.   Psychiatric/Behavioral: Negative.     Objective:  Physical Exam  Constitutional: He is oriented to person, place, and time. He appears well-developed. No distress.  Obese  HENT:  Head: Normocephalic and atraumatic.  Right Ear: External ear normal.  Left Ear: External ear normal.  Nose: Nose normal.  Mouth/Throat: Oropharynx is clear and moist.  Eyes: Conjunctivae and EOM are normal.  Neck: Normal range of motion. Neck supple.  Cardiovascular: Normal rate, regular rhythm, normal heart sounds and intact distal pulses.   No murmur heard. Respiratory: Effort normal and breath sounds normal. No respiratory distress. He has no wheezes.  GI: Soft. Bowel sounds are normal. He exhibits no distension. There is no tenderness.  Musculoskeletal:        Right hip: Normal.       Left hip: Normal.       Right knee: He exhibits decreased range of motion and swelling. He exhibits no effusion and no erythema. No tenderness found.       Left knee: He exhibits decreased range of motion and swelling. He exhibits no effusion and no erythema. Tenderness found. Medial joint line and lateral joint line tenderness noted.  Neurological: He is alert and oriented to person, place, and time. He has normal strength. No sensory deficit.  Skin: No rash noted. He is not diaphoretic. No erythema.  Psychiatric: He has a normal mood and affect. His behavior is normal.    Vitals  Weight: 221  lb Height: 71in Body Surface Area: 2.2 m Body Mass Index: 30.82 kg/m  Pulse: 80 (Regular)  BP: 126/74 (Sitting, Left Arm, Standard)  Imaging Review Plain radiographs demonstrate severe degenerative joint disease of the right knee(s). The overall alignment ismild varus. The bone quality appears to be good for age and reported activity level.  Assessment/Plan:  End stage primary osteoarthritis, left knee   The patient history, physical examination, clinical judgment of the provider and imaging studies are consistent with end stage degenerative joint disease of the left knee(s) and total knee arthroplasty is deemed medically necessary. The treatment options including medical management, injection therapy arthroscopy and arthroplasty were discussed at length. The risks and benefits of total knee arthroplasty were presented and reviewed. The risks due to aseptic loosening, infection, stiffness, patella tracking problems, thromboembolic complications and other imponderables were discussed. The patient acknowledged the explanation, agreed to proceed with the plan and consent was signed. Patient is being admitted for inpatient treatment for surgery, pain control, PT, OT, prophylactic antibiotics, VTE prophylaxis, progressive ambulation and ADL's and discharge planning. The  patient is planning to be discharged home with outpatient therapy   Note:PCP: Dr. Bevelyn Buckles Therapy Plans: outpatient therapy at [facility] with wife  Ardeen Jourdain, [place]

## 2016-01-26 NOTE — Progress Notes (Signed)
12/05/2015- Last office visit from [facility] Dr. Leanna Battles on chart. 11/28/2015- labs from [facility]. On chart- CBC,CMP, LIPID, MICRO ALBUMIN/ CREATININE, U/A, HEMOSURE 09/02/2015- noted in [facility]

## 2016-01-27 LAB — HEMOGLOBIN A1C
Hgb A1c MFr Bld: 5.6 % (ref 4.8–5.6)
Mean Plasma Glucose: 114 mg/dL

## 2016-01-31 ENCOUNTER — Inpatient Hospital Stay (HOSPITAL_COMMUNITY)
Admission: RE | Admit: 2016-01-31 | Discharge: 2016-02-02 | DRG: 470 | Disposition: A | Discharge status: Home / Self Care | Payer: Medicare Other | Source: Ambulatory Visit | Attending: Orthopedic Surgery | Admitting: Orthopedic Surgery

## 2016-01-31 ENCOUNTER — Encounter (HOSPITAL_COMMUNITY)
Admission: RE | Disposition: A | Discharge status: Home / Self Care | Payer: Self-pay | Source: Ambulatory Visit | Attending: Orthopedic Surgery

## 2016-01-31 ENCOUNTER — Inpatient Hospital Stay (HOSPITAL_COMMUNITY): Payer: Medicare Other | Admitting: Registered Nurse

## 2016-01-31 ENCOUNTER — Encounter (HOSPITAL_COMMUNITY): Payer: Self-pay | Admitting: Anesthesiology

## 2016-01-31 DIAGNOSIS — E785 Hyperlipidemia, unspecified: Secondary | ICD-10-CM | POA: Diagnosis present

## 2016-01-31 DIAGNOSIS — R002 Palpitations: Secondary | ICD-10-CM | POA: Diagnosis not present

## 2016-01-31 DIAGNOSIS — Z87891 Personal history of nicotine dependence: Secondary | ICD-10-CM

## 2016-01-31 DIAGNOSIS — Z79899 Other long term (current) drug therapy: Secondary | ICD-10-CM | POA: Diagnosis not present

## 2016-01-31 DIAGNOSIS — Z7984 Long term (current) use of oral hypoglycemic drugs: Secondary | ICD-10-CM

## 2016-01-31 DIAGNOSIS — Z96659 Presence of unspecified artificial knee joint: Secondary | ICD-10-CM

## 2016-01-31 DIAGNOSIS — I10 Essential (primary) hypertension: Secondary | ICD-10-CM | POA: Diagnosis present

## 2016-01-31 DIAGNOSIS — E1140 Type 2 diabetes mellitus with diabetic neuropathy, unspecified: Secondary | ICD-10-CM | POA: Diagnosis present

## 2016-01-31 DIAGNOSIS — F419 Anxiety disorder, unspecified: Secondary | ICD-10-CM | POA: Diagnosis present

## 2016-01-31 DIAGNOSIS — R0602 Shortness of breath: Secondary | ICD-10-CM | POA: Diagnosis not present

## 2016-01-31 DIAGNOSIS — Z96651 Presence of right artificial knee joint: Secondary | ICD-10-CM | POA: Diagnosis present

## 2016-01-31 DIAGNOSIS — M1712 Unilateral primary osteoarthritis, left knee: Secondary | ICD-10-CM | POA: Diagnosis present

## 2016-01-31 DIAGNOSIS — M24562 Contracture, left knee: Secondary | ICD-10-CM | POA: Diagnosis not present

## 2016-01-31 DIAGNOSIS — M25562 Pain in left knee: Secondary | ICD-10-CM | POA: Diagnosis not present

## 2016-01-31 HISTORY — PX: TOTAL KNEE ARTHROPLASTY: SHX125

## 2016-01-31 LAB — GLUCOSE, CAPILLARY
Glucose-Capillary: 102 mg/dL — ABNORMAL HIGH (ref 65–99)
Glucose-Capillary: 111 mg/dL — ABNORMAL HIGH (ref 65–99)
Glucose-Capillary: 140 mg/dL — ABNORMAL HIGH (ref 65–99)
Glucose-Capillary: 142 mg/dL — ABNORMAL HIGH (ref 65–99)

## 2016-01-31 LAB — TYPE AND SCREEN
ABO/RH(D): B POS
Antibody Screen: NEGATIVE

## 2016-01-31 SURGERY — ARTHROPLASTY, KNEE, TOTAL
Anesthesia: General | Site: Knee | Laterality: Left

## 2016-01-31 MED ORDER — ACETAMINOPHEN 325 MG PO TABS: 650.0000 mg | ORAL_TABLET | Freq: Four times a day (QID) | ORAL | Status: DC | PRN

## 2016-01-31 MED ORDER — ACETAMINOPHEN 10 MG/ML IV SOLN
INTRAVENOUS | Status: AC
  Filled 2016-01-31: qty 100

## 2016-01-31 MED ORDER — PROPOFOL 10 MG/ML IV BOLUS
INTRAVENOUS | Status: DC | PRN
  Administered 2016-01-31: 200 mg via INTRAVENOUS

## 2016-01-31 MED ORDER — LIDOCAINE 2% (20 MG/ML) 5 ML SYRINGE
INTRAMUSCULAR | Status: AC
  Filled 2016-01-31: qty 5

## 2016-01-31 MED ORDER — METHOCARBAMOL 1000 MG/10ML IJ SOLN
500.0000 mg | Freq: Four times a day (QID) | INTRAVENOUS | Status: DC | PRN
  Administered 2016-01-31: 500 mg via INTRAVENOUS
  Filled 2016-01-31: qty 5
  Filled 2016-01-31: qty 550

## 2016-01-31 MED ORDER — PROPOFOL 10 MG/ML IV BOLUS
INTRAVENOUS | Status: AC
  Filled 2016-01-31: qty 40

## 2016-01-31 MED ORDER — LOSARTAN POTASSIUM 50 MG PO TABS
100.0000 mg | ORAL_TABLET | Freq: Every day | ORAL | Status: DC
  Administered 2016-01-31 – 2016-02-02 (×2): 100 mg via ORAL
  Filled 2016-01-31 (×2): qty 2

## 2016-01-31 MED ORDER — BUPIVACAINE HCL (PF) 0.25 % IJ SOLN
INTRAMUSCULAR | Status: DC | PRN
  Administered 2016-01-31: 20 mL

## 2016-01-31 MED ORDER — ACETAMINOPHEN 650 MG RE SUPP: 650.0000 mg | Freq: Four times a day (QID) | RECTAL | Status: DC | PRN

## 2016-01-31 MED ORDER — SODIUM CHLORIDE 0.9 % IJ SOLN
INTRAMUSCULAR | Status: AC
  Filled 2016-01-31: qty 50

## 2016-01-31 MED ORDER — SUCCINYLCHOLINE CHLORIDE 200 MG/10ML IV SOSY
PREFILLED_SYRINGE | INTRAVENOUS | Status: AC
  Filled 2016-01-31: qty 10

## 2016-01-31 MED ORDER — LACTATED RINGERS IV SOLN
INTRAVENOUS | Status: DC
  Administered 2016-01-31 (×2): via INTRAVENOUS

## 2016-01-31 MED ORDER — ONDANSETRON HCL 4 MG/2ML IJ SOLN: 4.0000 mg | Freq: Four times a day (QID) | INTRAMUSCULAR | Status: DC | PRN

## 2016-01-31 MED ORDER — HYDROMORPHONE HCL 2 MG/ML IJ SOLN
INTRAMUSCULAR | Status: AC
  Filled 2016-01-31: qty 1

## 2016-01-31 MED ORDER — METHOCARBAMOL 500 MG PO TABS
500.0000 mg | ORAL_TABLET | Freq: Four times a day (QID) | ORAL | Status: DC | PRN
  Administered 2016-01-31 – 2016-02-01 (×3): 500 mg via ORAL
  Filled 2016-01-31 (×3): qty 1

## 2016-01-31 MED ORDER — BUPIVACAINE LIPOSOME 1.3 % IJ SUSP
INTRAMUSCULAR | Status: DC | PRN
  Administered 2016-01-31: 20 mL

## 2016-01-31 MED ORDER — FLEET ENEMA 7-19 GM/118ML RE ENEM: 1.0000 | ENEMA | Freq: Once | RECTAL | Status: DC | PRN

## 2016-01-31 MED ORDER — MIDAZOLAM HCL 2 MG/2ML IJ SOLN
INTRAMUSCULAR | Status: AC
  Filled 2016-01-31: qty 2

## 2016-01-31 MED ORDER — GABAPENTIN 300 MG PO CAPS
300.0000 mg | ORAL_CAPSULE | Freq: Two times a day (BID) | ORAL | Status: DC
  Administered 2016-01-31 – 2016-02-02 (×4): 300 mg via ORAL
  Filled 2016-01-31 (×4): qty 1

## 2016-01-31 MED ORDER — POLYVINYL ALCOHOL 1.4 % OP SOLN
1.0000 [drp] | OPHTHALMIC | Status: DC | PRN
  Filled 2016-01-31: qty 15

## 2016-01-31 MED ORDER — SACCHAROMYCES BOULARDII 250 MG PO CAPS
250.0000 mg | ORAL_CAPSULE | Freq: Two times a day (BID) | ORAL | Status: DC
  Administered 2016-01-31 – 2016-02-02 (×4): 250 mg via ORAL
  Filled 2016-01-31 (×5): qty 1

## 2016-01-31 MED ORDER — HYDROMORPHONE HCL 1 MG/ML IJ SOLN
INTRAMUSCULAR | Status: DC | PRN
  Administered 2016-01-31: 1 mg via INTRAVENOUS
  Administered 2016-01-31: 0.5 mg via INTRAVENOUS

## 2016-01-31 MED ORDER — BISACODYL 5 MG PO TBEC: 5.0000 mg | DELAYED_RELEASE_TABLET | Freq: Every day | ORAL | Status: DC | PRN

## 2016-01-31 MED ORDER — FERROUS SULFATE 325 (65 FE) MG PO TABS
325.0000 mg | ORAL_TABLET | Freq: Three times a day (TID) | ORAL | Status: DC
  Administered 2016-01-31 – 2016-02-02 (×5): 325 mg via ORAL
  Filled 2016-01-31 (×5): qty 1

## 2016-01-31 MED ORDER — SUCCINYLCHOLINE CHLORIDE 200 MG/10ML IV SOSY
PREFILLED_SYRINGE | INTRAVENOUS | Status: DC | PRN
Start: 2016-01-31 — End: 2016-01-31
  Administered 2016-01-31: 120 mg via INTRAVENOUS

## 2016-01-31 MED ORDER — CEFAZOLIN SODIUM-DEXTROSE 2-4 GM/100ML-% IV SOLN
2.0000 g | INTRAVENOUS | Status: AC
  Administered 2016-01-31: 2 g via INTRAVENOUS
  Filled 2016-01-31: qty 100

## 2016-01-31 MED ORDER — ALLOPURINOL 300 MG PO TABS
300.0000 mg | ORAL_TABLET | Freq: Every day | ORAL | Status: DC
  Administered 2016-01-31 – 2016-02-02 (×3): 300 mg via ORAL
  Filled 2016-01-31 (×3): qty 1

## 2016-01-31 MED ORDER — ALUM & MAG HYDROXIDE-SIMETH 200-200-20 MG/5ML PO SUSP: 30.0000 mL | ORAL | Status: DC | PRN

## 2016-01-31 MED ORDER — VITAMIN B-12 1000 MCG PO TABS
3000.0000 ug | ORAL_TABLET | Freq: Every day | ORAL | Status: DC
  Administered 2016-01-31 – 2016-02-01 (×2): 3000 ug via ORAL
  Filled 2016-01-31 (×2): qty 3

## 2016-01-31 MED ORDER — CEFAZOLIN IN D5W 1 GM/50ML IV SOLN
1.0000 g | Freq: Four times a day (QID) | INTRAVENOUS | Status: AC
  Administered 2016-01-31 (×2): 1 g via INTRAVENOUS
  Filled 2016-01-31 (×2): qty 50

## 2016-01-31 MED ORDER — LIDOCAINE HCL (CARDIAC) 20 MG/ML IV SOLN
INTRAVENOUS | Status: DC | PRN
  Administered 2016-01-31: 75 mg via INTRAVENOUS

## 2016-01-31 MED ORDER — FAMOTIDINE 20 MG PO TABS
20.0000 mg | ORAL_TABLET | Freq: Three times a day (TID) | ORAL | Status: DC
  Administered 2016-01-31 – 2016-02-02 (×4): 20 mg via ORAL
  Filled 2016-01-31 (×5): qty 1

## 2016-01-31 MED ORDER — SODIUM CHLORIDE 0.9 % IJ SOLN
INTRAMUSCULAR | Status: DC | PRN
  Administered 2016-01-31: 20 mL

## 2016-01-31 MED ORDER — BUPIVACAINE LIPOSOME 1.3 % IJ SUSP
20.0000 mL | Freq: Once | INTRAMUSCULAR | Status: DC
  Filled 2016-01-31: qty 20

## 2016-01-31 MED ORDER — EPHEDRINE 5 MG/ML INJ
INTRAVENOUS | Status: AC
  Filled 2016-01-31: qty 10

## 2016-01-31 MED ORDER — OXYCODONE-ACETAMINOPHEN 5-325 MG PO TABS
2.0000 | ORAL_TABLET | ORAL | Status: DC | PRN
  Administered 2016-02-01 – 2016-02-02 (×2): 2 via ORAL
  Filled 2016-01-31 (×2): qty 2

## 2016-01-31 MED ORDER — ONDANSETRON HCL 4 MG PO TABS: 4.0000 mg | ORAL_TABLET | Freq: Four times a day (QID) | ORAL | Status: DC | PRN

## 2016-01-31 MED ORDER — CILOSTAZOL 100 MG PO TABS
100.0000 mg | ORAL_TABLET | Freq: Two times a day (BID) | ORAL | Status: DC
  Administered 2016-01-31 – 2016-02-02 (×4): 100 mg via ORAL
  Filled 2016-01-31 (×4): qty 1

## 2016-01-31 MED ORDER — CEFAZOLIN SODIUM-DEXTROSE 2-4 GM/100ML-% IV SOLN
INTRAVENOUS | Status: AC
  Filled 2016-01-31: qty 100

## 2016-01-31 MED ORDER — BUPIVACAINE HCL (PF) 0.25 % IJ SOLN
INTRAMUSCULAR | Status: AC
  Filled 2016-01-31: qty 30

## 2016-01-31 MED ORDER — SODIUM CHLORIDE 0.9 % IR SOLN
Status: AC
  Filled 2016-01-31: qty 500000

## 2016-01-31 MED ORDER — HYDROCODONE-ACETAMINOPHEN 5-325 MG PO TABS
1.0000 | ORAL_TABLET | ORAL | Status: DC | PRN
  Administered 2016-01-31 – 2016-02-01 (×7): 2 via ORAL
  Administered 2016-02-02: 09:00:00 1 via ORAL
  Administered 2016-02-02: 02:00:00 2 via ORAL
  Filled 2016-01-31 (×9): qty 2

## 2016-01-31 MED ORDER — HYDROMORPHONE HCL 1 MG/ML IJ SOLN
1.0000 mg | INTRAMUSCULAR | Status: DC | PRN
  Administered 2016-01-31 – 2016-02-01 (×2): 1 mg via INTRAVENOUS
  Filled 2016-01-31 (×2): qty 1

## 2016-01-31 MED ORDER — ONDANSETRON HCL 4 MG/2ML IJ SOLN
INTRAMUSCULAR | Status: AC
  Filled 2016-01-31: qty 2

## 2016-01-31 MED ORDER — POLYETHYLENE GLYCOL 3350 17 G PO PACK: 17.0000 g | PACK | Freq: Every day | ORAL | Status: DC | PRN

## 2016-01-31 MED ORDER — PROPRANOLOL HCL 10 MG PO TABS
10.0000 mg | ORAL_TABLET | Freq: Two times a day (BID) | ORAL | Status: DC
  Administered 2016-01-31 – 2016-02-02 (×4): 10 mg via ORAL
  Filled 2016-01-31 (×4): qty 1

## 2016-01-31 MED ORDER — HYDROMORPHONE HCL 1 MG/ML IJ SOLN: 0.2500 mg | INTRAMUSCULAR | Status: DC | PRN

## 2016-01-31 MED ORDER — ZOLPIDEM TARTRATE 10 MG PO TABS: 10.0000 mg | ORAL_TABLET | Freq: Every evening | ORAL | Status: DC | PRN

## 2016-01-31 MED ORDER — PHENOL 1.4 % MT LIQD: 1.0000 | OROMUCOSAL | Status: DC | PRN

## 2016-01-31 MED ORDER — LACTATED RINGERS IV SOLN
INTRAVENOUS | Status: DC | PRN
  Administered 2016-01-31: 11:00:00 via INTRAVENOUS

## 2016-01-31 MED ORDER — TAMSULOSIN HCL 0.4 MG PO CAPS
0.4000 mg | ORAL_CAPSULE | Freq: Every day | ORAL | Status: DC
  Administered 2016-01-31 – 2016-02-01 (×2): 0.4 mg via ORAL
  Filled 2016-01-31 (×2): qty 1

## 2016-01-31 MED ORDER — TRANEXAMIC ACID 1000 MG/10ML IV SOLN
1000.0000 mg | INTRAVENOUS | Status: AC
  Administered 2016-01-31: 1000 mg via INTRAVENOUS
  Filled 2016-01-31: qty 10

## 2016-01-31 MED ORDER — CHLORHEXIDINE GLUCONATE 4 % EX LIQD: 60.0000 mL | Freq: Once | CUTANEOUS | Status: DC

## 2016-01-31 MED ORDER — RIVAROXABAN 10 MG PO TABS
10.0000 mg | ORAL_TABLET | Freq: Every day | ORAL | Status: DC
  Administered 2016-02-01 – 2016-02-02 (×2): 10 mg via ORAL
  Filled 2016-01-31 (×2): qty 1

## 2016-01-31 MED ORDER — INSULIN ASPART 100 UNIT/ML ~~LOC~~ SOLN
0.0000 [IU] | Freq: Three times a day (TID) | SUBCUTANEOUS | Status: DC
  Administered 2016-02-01: 2 [IU] via SUBCUTANEOUS
  Administered 2016-02-01: 18:00:00 3 [IU] via SUBCUTANEOUS
  Administered 2016-02-01: 08:00:00 2 [IU] via SUBCUTANEOUS
  Administered 2016-02-02: 5 [IU] via SUBCUTANEOUS

## 2016-01-31 MED ORDER — LACTATED RINGERS IV SOLN: INTRAVENOUS | Status: DC

## 2016-01-31 MED ORDER — PRAVASTATIN SODIUM 20 MG PO TABS
20.0000 mg | ORAL_TABLET | Freq: Every day | ORAL | Status: DC
  Administered 2016-01-31 – 2016-02-02 (×3): 20 mg via ORAL
  Filled 2016-01-31 (×3): qty 1

## 2016-01-31 MED ORDER — SODIUM CHLORIDE 0.9 % IR SOLN
Status: DC | PRN
  Administered 2016-01-31: 500 mL

## 2016-01-31 MED ORDER — PHENTERMINE HCL 37.5 MG PO CAPS: 37.5000 mg | ORAL_CAPSULE | Freq: Every day | ORAL | Status: DC

## 2016-01-31 MED ORDER — LINAGLIPTIN 5 MG PO TABS
5.0000 mg | ORAL_TABLET | Freq: Every day | ORAL | Status: DC
  Administered 2016-02-01 – 2016-02-02 (×2): 5 mg via ORAL
  Filled 2016-01-31 (×2): qty 1

## 2016-01-31 MED ORDER — ONDANSETRON HCL 4 MG/2ML IJ SOLN
INTRAMUSCULAR | Status: DC | PRN
  Administered 2016-01-31: 4 mg via INTRAVENOUS

## 2016-01-31 MED ORDER — FENTANYL CITRATE (PF) 100 MCG/2ML IJ SOLN
INTRAMUSCULAR | Status: DC | PRN
  Administered 2016-01-31 (×2): 100 ug via INTRAVENOUS

## 2016-01-31 MED ORDER — CELECOXIB 200 MG PO CAPS
200.0000 mg | ORAL_CAPSULE | Freq: Two times a day (BID) | ORAL | Status: DC
  Administered 2016-01-31 – 2016-02-02 (×4): 200 mg via ORAL
  Filled 2016-01-31 (×4): qty 1

## 2016-01-31 MED ORDER — MENTHOL 3 MG MT LOZG: 1.0000 | LOZENGE | OROMUCOSAL | Status: DC | PRN

## 2016-01-31 MED ORDER — ACETAMINOPHEN 10 MG/ML IV SOLN
INTRAVENOUS | Status: DC | PRN
  Administered 2016-01-31: 1000 mg via INTRAVENOUS

## 2016-01-31 MED ORDER — EPHEDRINE SULFATE-NACL 50-0.9 MG/10ML-% IV SOSY
PREFILLED_SYRINGE | INTRAVENOUS | Status: DC | PRN
  Administered 2016-01-31: 5 mg via INTRAVENOUS

## 2016-01-31 MED ORDER — ONDANSETRON HCL 4 MG/2ML IJ SOLN: 4.0000 mg | Freq: Once | INTRAMUSCULAR | Status: DC | PRN

## 2016-01-31 MED ORDER — HYPROMELLOSE 0.3 % OP SOLN: 1.0000 [drp] | Freq: Three times a day (TID) | OPHTHALMIC | Status: DC | PRN

## 2016-01-31 MED ORDER — MEPERIDINE HCL 50 MG/ML IJ SOLN: 6.2500 mg | INTRAMUSCULAR | Status: DC | PRN

## 2016-01-31 MED ORDER — FENTANYL CITRATE (PF) 100 MCG/2ML IJ SOLN
INTRAMUSCULAR | Status: AC
  Filled 2016-01-31: qty 2

## 2016-01-31 MED ORDER — MIDAZOLAM HCL 5 MG/5ML IJ SOLN
INTRAMUSCULAR | Status: DC | PRN
  Administered 2016-01-31: 2 mg via INTRAVENOUS

## 2016-01-31 SURGICAL SUPPLY — 60 items
BAG DECANTER FOR FLEXI CONT (MISCELLANEOUS) IMPLANT
BAG ZIPLOCK 12X15 (MISCELLANEOUS) ×2 IMPLANT
BANDAGE ACE 4X5 VEL STRL LF (GAUZE/BANDAGES/DRESSINGS) ×2 IMPLANT
BANDAGE ACE 6X5 VEL STRL LF (GAUZE/BANDAGES/DRESSINGS) ×2 IMPLANT
BLADE SAG 18X100X1.27 (BLADE) ×2 IMPLANT
BLADE SAW SGTL 11.0X1.19X90.0M (BLADE) ×2 IMPLANT
BNDG GAUZE ELAST 4 BULKY (GAUZE/BANDAGES/DRESSINGS) ×2 IMPLANT
BONE CEMENT GENTAMICIN (Cement) ×4 IMPLANT
CAP KNEE TOTAL 3 SIGMA ×2 IMPLANT
CEMENT BONE GENTAMICIN 40 (Cement) ×2 IMPLANT
CLOTH BEACON ORANGE TIMEOUT ST (SAFETY) ×2 IMPLANT
CUFF TOURN SGL QUICK 34 (TOURNIQUET CUFF) ×1
CUFF TRNQT CYL 34X4X40X1 (TOURNIQUET CUFF) ×1 IMPLANT
DECANTER SPIKE VIAL GLASS SM (MISCELLANEOUS) ×2 IMPLANT
DRAPE INCISE IOBAN 66X45 STRL (DRAPES) IMPLANT
DRAPE U-SHAPE 47X51 STRL (DRAPES) ×2 IMPLANT
DRSG ADAPTIC 3X8 NADH LF (GAUZE/BANDAGES/DRESSINGS) ×2 IMPLANT
DURAPREP 26ML APPLICATOR (WOUND CARE) ×2 IMPLANT
ELECT REM PT RETURN 9FT ADLT (ELECTROSURGICAL) ×2
ELECTRODE REM PT RTRN 9FT ADLT (ELECTROSURGICAL) ×1 IMPLANT
EVACUATOR 1/8 PVC DRAIN (DRAIN) ×2 IMPLANT
FACESHIELD WRAPAROUND (MASK) IMPLANT
GAUZE SPONGE 4X4 12PLY STRL (GAUZE/BANDAGES/DRESSINGS) ×2 IMPLANT
GLOVE BIOGEL PI IND STRL 6.5 (GLOVE) ×2 IMPLANT
GLOVE BIOGEL PI IND STRL 8 (GLOVE) ×1 IMPLANT
GLOVE BIOGEL PI INDICATOR 6.5 (GLOVE) ×2
GLOVE BIOGEL PI INDICATOR 8 (GLOVE) ×1
GLOVE ECLIPSE 8.0 STRL XLNG CF (GLOVE) ×4 IMPLANT
GLOVE SURG SS PI 6.5 STRL IVOR (GLOVE) ×4 IMPLANT
GOWN STRL REUS W/TWL LRG LVL3 (GOWN DISPOSABLE) ×6 IMPLANT
GOWN STRL REUS W/TWL XL LVL3 (GOWN DISPOSABLE) ×2 IMPLANT
HANDPIECE INTERPULSE COAX TIP (DISPOSABLE) ×1
HEMOSTAT SPONGE AVITENE ULTRA (HEMOSTASIS) ×2 IMPLANT
IMMOBILIZER KNEE 20 (SOFTGOODS) ×2 IMPLANT
IMMOBILIZER KNEE 20 THIGH 36 (SOFTGOODS) IMPLANT
MANIFOLD NEPTUNE II (INSTRUMENTS) ×2 IMPLANT
NEEDLE HYPO 21X1.5 SAFETY (NEEDLE) IMPLANT
NEEDLE HYPO 22GX1.5 SAFETY (NEEDLE) IMPLANT
NS IRRIG 1000ML POUR BTL (IV SOLUTION) IMPLANT
PACK TOTAL KNEE CUSTOM (KITS) ×2 IMPLANT
PAD ABD 8X10 STRL (GAUZE/BANDAGES/DRESSINGS) ×4 IMPLANT
PENCIL SMOKE EVAC W/HOLSTER (ELECTROSURGICAL) ×2 IMPLANT
POSITIONER SURGICAL ARM (MISCELLANEOUS) ×2 IMPLANT
SET HNDPC FAN SPRY TIP SCT (DISPOSABLE) ×1 IMPLANT
SET PAD KNEE POSITIONER (MISCELLANEOUS) ×2 IMPLANT
SPONGE LAP 18X18 X RAY DECT (DISPOSABLE) ×2 IMPLANT
STRIP CLOSURE SKIN 1/2X4 (GAUZE/BANDAGES/DRESSINGS) ×4 IMPLANT
SUT BONE WAX W31G (SUTURE) IMPLANT
SUT MNCRL AB 4-0 PS2 18 (SUTURE) ×2 IMPLANT
SUT VIC AB 1 CT1 27 (SUTURE) ×2
SUT VIC AB 1 CT1 27XBRD ANTBC (SUTURE) ×2 IMPLANT
SUT VIC AB 2-0 CT1 27 (SUTURE) ×3
SUT VIC AB 2-0 CT1 TAPERPNT 27 (SUTURE) ×3 IMPLANT
SUT VLOC 180 0 24IN GS25 (SUTURE) ×2 IMPLANT
SYR 20CC LL (SYRINGE) ×4 IMPLANT
TOWER CARTRIDGE SMART MIX (DISPOSABLE) ×2 IMPLANT
TRAY FOLEY W/METER SILVER 16FR (SET/KITS/TRAYS/PACK) ×2 IMPLANT
WATER STERILE IRR 1000ML POUR (IV SOLUTION) ×4 IMPLANT
WRAP KNEE MAXI GEL POST OP (GAUZE/BANDAGES/DRESSINGS) ×2 IMPLANT
YANKAUER SUCT BULB TIP 10FT TU (MISCELLANEOUS) ×2 IMPLANT

## 2016-01-31 NOTE — Anesthesia Postprocedure Evaluation (Signed)
Anesthesia Post Note  Patient: Raymond Herring  Procedure(s) Performed: Procedure(s) (LRB): LEFT TOTAL KNEE ARTHROPLASTY (Left)  Patient location during evaluation: PACU Anesthesia Type: General Level of consciousness: awake and alert and oriented Pain management: pain level controlled Vital Signs Assessment: post-procedure vital signs reviewed and stable Respiratory status: spontaneous breathing, nonlabored ventilation and respiratory function stable Cardiovascular status: blood pressure returned to baseline and stable Postop Assessment: no signs of nausea or vomiting Anesthetic complications: no       Last Vitals:  Vitals:   01/31/16 1416 01/31/16 1430  BP:  (!) 135/100  Pulse: 79 79  Resp: 16 14  Temp:  36.9 C    Last Pain:  Vitals:   01/31/16 1430  TempSrc:   PainSc: 5     LLE Motor Response: Purposeful movement (01/31/16 1430) LLE Sensation: Full sensation (01/31/16 1430) RLE Motor Response: Purposeful movement (01/31/16 1430) RLE Sensation: Full sensation (01/31/16 1430)      Davionne Dowty A.

## 2016-01-31 NOTE — Interval H&P Note (Signed)
History and Physical Interval Note:  01/31/2016 10:51 AM  Raymond Herring  has presented today for surgery, with the diagnosis of OA Left knee  The various methods of treatment have been discussed with the patient and family. After consideration of risks, benefits and other options for treatment, the patient has consented to  Procedure(s): LEFT TOTAL KNEE ARTHROPLASTY (Left) as a surgical intervention .  The patient's history has been reviewed, patient examined, no change in status, stable for surgery.  I have reviewed the patient's chart and labs.  Questions were answered to the patient's satisfaction.     [name]

## 2016-01-31 NOTE — Transfer of Care (Signed)
Immediate Anesthesia Transfer of Care Note  Patient: Raymond Herring  Procedure(s) Performed: Procedure(s): LEFT TOTAL KNEE ARTHROPLASTY (Left)  Patient Location: PACU  Anesthesia Type:General  Level of Consciousness: awake, alert , oriented and patient cooperative  Airway & Oxygen Therapy: Patient Spontanous Breathing and Patient connected to face mask oxygen  Post-op Assessment: Report given to RN, Post -op Vital signs reviewed and stable and Patient moving all extremities  Post vital signs: Reviewed and stable  Last Vitals:  Vitals:   01/31/16 0905  BP: 129/77  Pulse: 73  Resp: 18  Temp: 36.9 C    Last Pain:  Vitals:   01/31/16 0927  TempSrc:   PainSc: 1       Patients Stated Pain Goal: 4 ([id number])  Complications: No apparent anesthesia complications

## 2016-01-31 NOTE — Brief Op Note (Signed)
01/31/2016  12:48 PM  PATIENT:  Raymond Herring  66 y.o. male  PRE-OPERATIVE DIAGNOSIS: Primary  OA Left knee with Flexion Contracture  POST-OPERATIVE DIAGNOSIS:Primary   OA Left knee with Flexion Contracture,  PROCEDURE:  Procedure(s): LEFT TOTAL KNEE ARTHROPLASTY (Left) and release of Contractures.  SURGEON:  Surgeon(s) and Role:    * Latanya Maudlin, MD - Primary  PHYSICIAN ASSISTANT:Amber Kiryas Joel PA   ASSISTANTS: Ardeen Jourdain PA  ANESTHESIA:   general  EBL:  Total I/O In: 1000 [I.V.:1000] Out: -   BLOOD ADMINISTERED:none  DRAINS: (One) Hemovact drain(s) in the Left Knee with  Suction Open   LOCAL MEDICATIONS USED:  MARCAINE 20cc of 0.25% Plain and Exparel20cc mixed with 20cc of Normal Saline.    SPECIMEN:  No Specimen  DISPOSITION OF SPECIMEN:  N/A  COUNTS:  YES  TOURNIQUET:  * Missing tourniquet times found for documented tourniquets in log:  [id number] *  DICTATION: .Other Dictation: Dictation Number [id number]  PLAN OF CARE: Admit to inpatient   PATIENT DISPOSITION:  Stable in OR   Delay start of Pharmacological VTE agent (>24hrs) due to surgical blood loss or risk of bleeding: yes

## 2016-01-31 NOTE — Anesthesia Preprocedure Evaluation (Addendum)
Anesthesia Evaluation  Patient identified by MRN, date of birth, ID band Patient awake    Reviewed: Allergy & Precautions, NPO status , Patient's Chart, lab work & pertinent test results, reviewed documented beta blocker date and time   Airway Mallampati: I       Dental  (+) Teeth Intact, Caps,    Pulmonary shortness of breath and with exertion, former smoker,    Pulmonary exam normal breath sounds clear to auscultation       Cardiovascular hypertension, Pt. on medications and Pt. on home beta blockers Normal cardiovascular exam Rhythm:Regular Rate:Normal     Neuro/Psych  Headaches, Anxiety    GI/Hepatic negative GI ROS, Neg liver ROS,   Endo/Other  diabetes, Well Controlled, Type 2, Oral Hypoglycemic AgentsObesity Hyperlipidemia  Renal/GU negative Renal ROS  negative genitourinary   Musculoskeletal  (+) Arthritis , Osteoarthritis,  OA left knee   Abdominal (+) + obese,   Peds  Hematology negative hematology ROS (+)   Anesthesia Other Findings   Reproductive/Obstetrics                              Chemistry      Component Value Date/Time   NA 139 01/26/2016 1340   K 4.3 01/26/2016 1340   CL 104 01/26/2016 1340   CO2 27 01/26/2016 1340   BUN 23 (H) 01/26/2016 1340   CREATININE 1.00 01/26/2016 1340      Component Value Date/Time   CALCIUM 10.2 01/26/2016 1340   ALKPHOS 63 01/26/2016 1340   AST 22 01/26/2016 1340   ALT 25 01/26/2016 1340   BILITOT 1.0 01/26/2016 1340     Lab Results  Component Value Date   WBC 5.6 01/26/2016   HGB 14.1 01/26/2016   HCT 39.9 01/26/2016   MCV 95.0 01/26/2016   PLT 176 01/26/2016   EKG: NSR, low voltage in precordial leads ,otherwise normal EKG.   Anesthesia Physical Anesthesia Plan  ASA: II  Anesthesia Plan: General   Post-op Pain Management:    Induction: Intravenous  Airway Management Planned: Oral ETT  Additional Equipment:    Intra-op Plan:   Post-operative Plan: Extubation in OR  Informed Consent: I have reviewed the patients History and Physical, chart, labs and discussed the procedure including the risks, benefits and alternatives for the proposed anesthesia with the patient or authorized representative who has indicated his/her understanding and acceptance.   Dental advisory given  Plan Discussed with: CRNA, Anesthesiologist and Surgeon  Anesthesia Plan Comments:         Anesthesia Quick Evaluation

## 2016-01-31 NOTE — Anesthesia Procedure Notes (Signed)
Procedure Name: Intubation Date/Time: 01/31/2016 11:11 AM Performed by: Lollie Sails Pre-anesthesia Checklist: Patient identified, Emergency Drugs available, Suction available, Patient being monitored and Timeout performed Patient Re-evaluated:Patient Re-evaluated prior to inductionOxygen Delivery Method: Circle system utilized Preoxygenation: Pre-oxygenation with 100% oxygen Intubation Type: IV induction Ventilation: Mask ventilation without difficulty Laryngoscope Size: Mac and 4 Grade View: Grade I Tube type: Oral Tube size: 7.5 mm Number of attempts: 1 Airway Equipment and Method: Stylet Placement Confirmation: ETT inserted through vocal cords under direct vision,  positive ETCO2 and breath sounds checked- equal and bilateral Secured at: 23 cm Tube secured with: Tape Dental Injury: Teeth and Oropharynx as per pre-operative assessment

## 2016-02-01 ENCOUNTER — Encounter (HOSPITAL_COMMUNITY): Payer: Self-pay | Admitting: Orthopedic Surgery

## 2016-02-01 LAB — CBC
HCT: 33.1 % — ABNORMAL LOW (ref 39.0–52.0)
Hemoglobin: 11.6 g/dL — ABNORMAL LOW (ref 13.0–17.0)
MCH: 33.8 pg (ref 26.0–34.0)
MCHC: 35 g/dL (ref 30.0–36.0)
MCV: 96.5 fL (ref 78.0–100.0)
Platelets: 135 10*3/uL — ABNORMAL LOW (ref 150–400)
RBC: 3.43 MIL/uL — ABNORMAL LOW (ref 4.22–5.81)
RDW: 13.1 % (ref 11.5–15.5)
WBC: 6.7 10*3/uL (ref 4.0–10.5)

## 2016-02-01 LAB — BASIC METABOLIC PANEL
Anion gap: 6 (ref 5–15)
BUN: 19 mg/dL (ref 6–20)
CO2: 29 mmol/L (ref 22–32)
Calcium: 9.1 mg/dL (ref 8.9–10.3)
Chloride: 102 mmol/L (ref 101–111)
Creatinine, Ser: 0.84 mg/dL (ref 0.61–1.24)
GFR calc Af Amer: >60 mL/min (ref >60)
GFR calc non Af Amer: >60 mL/min (ref >60)
Glucose, Bld: 135 mg/dL — ABNORMAL HIGH (ref 65–99)
Potassium: 4.1 mmol/L (ref 3.5–5.1)
Sodium: 137 mmol/L (ref 135–145)

## 2016-02-01 LAB — GLUCOSE, CAPILLARY
Glucose-Capillary: 135 mg/dL — ABNORMAL HIGH (ref 65–99)
Glucose-Capillary: 129 mg/dL — ABNORMAL HIGH (ref 65–99)
Glucose-Capillary: 178 mg/dL — ABNORMAL HIGH (ref 65–99)
Glucose-Capillary: 185 mg/dL — ABNORMAL HIGH (ref 65–99)

## 2016-02-01 MED ORDER — ASPIRIN EC 325 MG PO TBEC: 325.0000 mg | DELAYED_RELEASE_TABLET | Freq: Two times a day (BID) | ORAL | 0 refills | Status: DC

## 2016-02-01 MED ORDER — METHOCARBAMOL 500 MG PO TABS: 500.0000 mg | ORAL_TABLET | Freq: Four times a day (QID) | ORAL | 1 refills | Status: DC | PRN

## 2016-02-01 MED ORDER — HYDROCODONE-ACETAMINOPHEN 5-325 MG PO TABS: 1.0000 | ORAL_TABLET | ORAL | 0 refills | Status: DC | PRN

## 2016-02-01 NOTE — Op Note (Signed)
NAMESABASTIAN, EVERAGE              ACCOUNT NO.:  [id number]  MEDICAL RECORD NO.:  [id number]  LOCATION:                                 FACILITY:  PHYSICIAN:  Kipp Brood. Alma Muegge, M.D.DATE OF BIRTH:  1950-05-21  DATE OF PROCEDURE: DATE OF DISCHARGE:                              OPERATIVE REPORT   PREOPERATIVE DIAGNOSES: 1. Severe flexion contracture, left knee. 2. Severe primary osteoarthritis with bone-on-bone. 3. Contracture opposite right total knee.  POSTOPERATIVE DIAGNOSES: 1. Severe flexion contracture, left knee. 2. Severe primary osteoarthritis with bone-on-bone. 3. Contracture opposite right total knee.  OPERATION:  A left total knee arthroplasty utilizing the DePuy system. I utilized a size 5 left femoral component posterior cruciate sacrificing type.  The tibial tray was a size 5.  The insert was a size 5, 10 mm thickness.  The patella button was a size 41 with 3 pegs.  All 3 components were cemented with gentamicin and cement.  DESCRIPTION OF PROCEDURE:  Under general anesthesia, routine orthopedic prep and draping of left lower extremity was carried out.  The appropriate time-out was carried out 1st, also marked the appropriate left leg in the holding area.  At this time, the leg was exsanguinated with an Esmarch.  With Esmarch, tourniquet was elevated to 325 mmHg. The patient had 2 g of IV Ancef.  He had 1 g of tranexamic acid as well and 1 g of Tylenol IV.  Anterior approach to the knee was carried out, 2 flaps were created.  I then did a median parapatellar incision, reflected the patella laterally and did medial and lateral meniscectomies at this time.  Also, I excised the anterior and posterior cruciate ligaments.  With the knee flexed, we removed all large spurs and then made our initial drill hole in the intercondylar notch.  A guide rod was inserted up the canal.  We had a good position in the canal.  We removed the guide rod, thoroughly irrigated out the  canal.  I then measured the distal femur to be a size 5.  We then did our appropriate anterior-posterior chamfer cuts for a size 5 left femoral component.  Next, attention was directed to the tibia.  We removed the spinous processes of the tibia with oscillating saw.  We then measured the tibia to be a size 5 as well.  We then made our initial drill hole in the tibial plateau.  We then removed 4 mm thickness off the affected medial side of the tibia.  At that particular time, we then inserted our lamina spreaders and removed the posterior osteophytes from the medial and lateral femoral condyles.  After that, we then inserted our spacer block and selected a 10 mm thickness insert.  Note, at this particular point, we then continued to prepare the tibial plateau.  We cut our keel cut in the plateau.  We then did our notch cut out of the distal femur. We inserted our trial components, extended the knee and had excellent stability and excellent flexion and extension with a 10 mm thickness insert.  We then did a resurfacing procedure on the patella.  Three drill holes were made in the patella at this  time.  We then removed all trial components, thoroughly water picked out the knee and cemented all 3 components in simultaneously with gentamicin and cement.  Once the cement was hardened, we removed all loose pieces of cement.  I then water picked the knee out again to make sure all loose pieces of cement were removed.  I then inserted the permanent 10 mm thickness size 5 rotating platform polyethylene tray, reduced the knee and had excellent function.  Hemovac drain was inserted.  We then closed the knee in layers in usual fashion.  Sterile dressings were applied.  SURGEON:  Kipp Brood. Gladstone Lighter, M.D.  OPERATIVE ASSISTANT:  Ardeen Jourdain, PA.          ______________________________ Kipp Brood Gladstone Lighter, M.D.     RAG/MEDQ  D:  01/31/2016  T:  02/01/2016  Job:  [id number]

## 2016-02-01 NOTE — Care Management Note (Signed)
Case Management Note  Patient Details  Name: Raymond Herring MRN: [id number] Date of Birth: 1950-07-10  Subjective/Objective:                  LEFT TOTAL KNEE ARTHROPLASTY (Left) Action/Plan: Discharge planning Expected Discharge Date:  02/02/16               Expected Discharge Plan:  Home/Self Care  In-House Referral:     Discharge planning Services  CM Consult  Post Acute Care Choice:  NA Choice offered to:  NA  DME Arranged:  N/A DME Agency:  NA  HH Arranged:  NA HH Agency:  NA  Status of Service:  Completed, signed off  If discussed at [place] of Stay Meetings, dates discussed:    Additional Comments: Cm notes pt to have outpt PT and has all DME needed at home.  No other CM needs were communicated. Dellie Catholic, RN 02/01/2016, 11:49 AM

## 2016-02-01 NOTE — Progress Notes (Signed)
CSW consulted for SNF placement. PN reviewed. Pt is planning to return home at d/c. RNCM is assisting with d/c planning.  CSW signing off.  Werner Lean LCSW [phone]

## 2016-02-01 NOTE — Evaluation (Addendum)
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 See Epic for complete PMH.  Clinical Impression  The patient is concerned for being constipated, RN aware. The patient did tolerated AAROM left knee. Ambulates with Right ASO on the ankle. Pt admitted with above diagnosis. Pt currently with functional limitations due to the deficits listed below (see PT Problem List).  Pt will benefit from skilled PT to increase their independence and safety with mobility to allow discharge to the venue listed below.       Follow Up Recommendations Outpatient PT;Supervision/Assistance - 24 hour    Equipment Recommendations  Rolling walker with 5" wheels    Recommendations for Other Services       Precautions / Restrictions Precautions Precautions: Knee;Fall Precaution Comments: H/O neuropathy bilateral LOE's. Charcot ankle Right LE.  Required Braces or Orthoses: Knee Immobilizer - Left;Other Brace/Splint Other Brace/Splint: ASO right ankle Restrictions Weight Bearing Restrictions: No      Mobility  Bed Mobility Overal bed mobility: Needs Assistance Bed MobilityS:upine Sit to supine      Supine to sit: Min assist     General bed mobility comments: Assist for LLE  Transfers Overall transfer level: Needs assistance Equipment used: Rolling walker (2 wheeled) Transfers: Sit to/from [facility] Sit to Stand: Min guard Stand pivot transfers: Min guard       General transfer comment: VC's for safety and sequencing with RW, hand placement.  Ambulation/Gait    with RW x 100' , cues for sequence step length            Stairs            Wheelchair Mobility    Modified Rankin (Stroke Patients Only)       Balance Overall balance assessment:  (Pt with h/o bilateral LE neuropathy, denies falls)                                            Pertinent Vitals/Pain Pain Score: 6  Pain Location: Left knee Pain Descriptors / Indicators: Aching Pain Intervention(s): Monitored during session;Premedicated before session;Repositioned    Home Living Family/patient expects to be discharged to:: Private residence Living Arrangements: Spouse/significant other Available Help at Discharge: Available 24 hours/day Type of Home: House Home Access: Stairs to enter Entrance Stairs-Rails: None Entrance Stairs-Number of Steps: 4 Home Layout: Two level;1/2 bath on main level;Able to live on main level with bedroom/bathroom Home Equipment: [name] - single point;Shower seat - built in;Hand held shower head      Prior Function Level of Independence: Independent               Hand Dominance   Dominant Hand: Left    Extremity/Trunk Assessment   Upper Extremity Assessment Upper Extremity Assessment: Overall WFL for tasks assessed    Lower Extremity Assessment Lower Extremity Assessment: LLE deficits/detail LLE Deficits / Details: knee flexion 40    Cervical / Trunk Assessment Cervical / Trunk Assessment: somewhat limited extension  Communication   Communication: No difficulties  Cognition Arousal/Alertness: Awake/alert Behavior During Therapy: WFL for tasks assessed/performed Overall Cognitive Status: Within Functional Limits for tasks assessed  General Comments      Exercises  x 10 reps ankle pumps, heel slides, SLR, abduction on left. AAROM.   Assessment/Plan    PT Assessment Patient needs continued PT services  PT Problem List Decreased strength;Decreased activity tolerance;Decreased balance;Decreased mobility;Decreased knowledge of precautions;Decreased safety awareness;Decreased knowledge of use of DME          PT Treatment Interventions DME instruction;Gait training;Stair training;Functional mobility training;Therapeutic  activities;Therapeutic exercise;Patient/family education    PT Goals (Current goals can be found in the Care Plan section)  Acute Rehab PT Goals Patient Stated Goal: Get back to deer hunting; Go home    Frequency 7X/week   Barriers to discharge        Co-evaluation               End of Session Equipment Utilized During Treatment: Left knee immobilizer Activity Tolerance: Patient tolerated treatment well Patient left: in bed;with call bell/phone within reach Nurse Communication: Mobility status         Time: 1200-1243 PT Time Calculation (min) (ACUTE ONLY): 43 min   Charges:   PT Evaluation $PT Eval Low Complexity: 1 Procedure PT Treatments $Gait Training: 8-22 mins $Therapeutic Exercise: 8-22 mins   PT G Codes:        Claretha Cooper 02/01/2016, 1:33 PM

## 2016-02-01 NOTE — Evaluation (Signed)
Occupational Therapy Evaluation Patient Details Name: Raymond Herring MRN: [id number] DOB: [birth year] Today's Date: 02/01/2016    History of Present Illness Pt is s/p L TKR/revision of contractures. See Epic for complete PMH.   Clinical Impression   Pt admitted as above. He is currently Min assist overall for transfers and safety. He participated in ADL retraining session for functional mobility, transfers as well as grooming and bathing techniques. Pt c/o nausea during functional mobility on way to bathroom, therefore was assisted to chair. Will try toilet/shower transfers next visit.    Follow Up Recommendations  No OT follow up;Supervision - Intermittent    Equipment Recommendations  None recommended by OT    Recommendations for Other Services       Precautions / Restrictions Precautions Precautions: Knee;Fall Precaution Comments: H/O neuropathy bilateral LOE's. Drop foot Right LE.  Required Braces or Orthoses: Knee Immobilizer - Left Restrictions Weight Bearing Restrictions: No      Mobility Bed Mobility Overal bed mobility: Needs Assistance Bed Mobility: Supine to Sit     Supine to sit: Min assist     General bed mobility comments: Assist for LLE  Transfers Overall transfer level: Needs assistance Equipment used: Rolling walker (2 wheeled) Transfers: Sit to/from [facility] Sit to Stand: Min guard Stand pivot transfers: Min guard       General transfer comment: VC's for safety and sequencing with RW, hand placement.    Balance Overall balance assessment:  (Pt with h/o bilateral LE neuropathy, denies falls)                                          ADL Overall ADL's : Needs assistance/impaired Eating/Feeding: Independent;Sitting   Grooming: Wash/dry hands;Wash/dry face;Oral care;Set up;Sitting   Upper Body Bathing: Set up;Sitting   Lower Body Bathing: Min guard;Sitting/lateral leans   Upper Body Dressing : Set  up;Sitting   Lower Body Dressing: Minimal assistance;Sitting/lateral leans;Sit to/from stand   Toilet Transfer: Min guard;Ambulation;RW;Cueing for sequencing (Simulated transfer from EOB to recliner - was attmepting bathroom, but pt c/o nausea, some dizziness)   Toileting- Clothing Manipulation and Hygiene: Sitting/lateral lean;Sit to/from stand;Min guard       Functional mobility during ADLs: Min guard;Cueing for sequencing;Rolling walker General ADL Comments: Pt was educated in role of OT followed by ADL retraining session for functional mobility and transfers as well as grooming and bathing techniques. Pt c/o nausea during functional mobility therefore was assisted to chair. Will try toilet transfers and shower next visit.     Vision  Wears glasses at all times; No change from baseline.   Perception     Praxis      Pertinent Vitals/Pain Pain Assessment: 0-10 Pain Score: 6  Pain Descriptors / Indicators: Aching Pain Intervention(s): Limited activity within patient's tolerance;Monitored during session;Premedicated before session;Repositioned;Ice applied     Hand Dominance Left   Extremity/Trunk Assessment Upper Extremity Assessment Upper Extremity Assessment: Overall WFL for tasks assessed;Generalized weakness (impairment L UE for reaching behind his head. )   Lower Extremity Assessment Lower Extremity Assessment: Defer to PT evaluation       Communication Communication Communication: No difficulties   Cognition Arousal/Alertness: Awake/alert Behavior During Therapy: WFL for tasks assessed/performed Overall Cognitive Status: Within Functional Limits for tasks assessed                     General  Comments       Exercises       Shoulder Instructions      Home Living Family/patient expects to be discharged to:: Private residence Living Arrangements: Spouse/significant other Available Help at Discharge: Available 24 hours/day Type of Home: House Home  Access: Stairs to enter [employer] of Steps: 4 Entrance Stairs-Rails: None Home Layout: Two level;1/2 bath on main level;Able to live on main level with bedroom/bathroom Alternate Level Stairs-Number of Steps: Has daybed downstairs and able to stay down until can go up 13 steps to second floor. Rails on left Alternate Level Stairs-Rails: Left Bathroom Shower/Tub: Walk-in shower (No step)   Bathroom Toilet: Handicapped height     Home Equipment: Cane - single point;Shower seat - built in;Hand held shower head          Prior Functioning/Environment Level of Independence: Independent                 OT Problem List: Decreased knowledge of use of DME or AE;Decreased strength;Decreased activity tolerance;Pain;Impaired sensation (Bilateral LE neuropathy)   OT Treatment/Interventions: Self-care/ADL training;Therapeutic exercise;Patient/family education;Therapeutic activities;DME and/or AE instruction    OT Goals(Current goals can be found in the care plan section) Acute Rehab OT Goals Patient Stated Goal: Get back to deer hunting; Go home Time For Goal Achievement: 02/08/16 Potential to Achieve Goals: Good  OT Frequency: Min 2X/week   Barriers to D/C:            Co-evaluation              End of Session Equipment Utilized During Treatment: Gait belt;Rolling walker;Left knee immobilizer Nurse Communication: Other (comment);Mobility status (NT educated in mobility and ADL status)  Activity Tolerance: Patient tolerated treatment well;Other (comment) (c/o nausea, was assisted to recliner chair) Patient left: in chair;with call bell/phone within reach;with chair alarm set   Time: [phone] OT Time Calculation (min): 43 min Charges:  OT General Charges $OT Visit: 1 Procedure OT Evaluation $OT Eval Low Complexity: 1 Procedure OT Treatments $Self Care/Home Management : 23-37 mins G-Codes:    Almyra Deforest, OTR/L 02/01/2016, 10:05 AM

## 2016-02-01 NOTE — Discharge Instructions (Signed)

## 2016-02-01 NOTE — Progress Notes (Signed)
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 See Epic for complete PMH.    PT Comments    The patient is experiencing 7/10 pain. Premedicated, ice applied. Continue PT. Plans Dc tomorrow if pain is well controlled.  Follow Up Recommendations  Outpatient PT;Supervision/Assistance - 24 hour     Equipment Recommendations  Rolling walker with 5" wheels    Recommendations for Other Services       Precautions / Restrictions Precautions Precautions: Knee;Fall Precaution Comments: H/O neuropathy bilateral LOE's. Charcot ankle Right LE.  Required Braces or Orthoses: Knee Immobilizer - Left;Other Brace/Splint Other Brace/Splint: ASO right ankle    Mobility  Bed Mobility Overal bed mobility: Needs Assistance Bed Mobility: Supine to Sit;Sit to Supine           General bed mobility comments: Assist for LLE  Transfers Overall transfer level: Needs assistance Equipment used: Rolling walker (2 wheeled) Transfers: Sit to/from Stand Sit to Stand: Min guard         General transfer comment: VC's for safety and sequencing with RW, hand placement.  Ambulation/Gait                 Stairs            Wheelchair Mobility    Modified Rankin (Stroke Patients Only)       Balance                                    Cognition Arousal/Alertness: Awake/alert Behavior During Therapy: WFL for tasks assessed/performed Overall Cognitive Status: Within Functional Limits for tasks assessed                      Exercises      General Comments        Pertinent Vitals/Pain Pain Score: 7  Pain Location: Left knee Pain Descriptors / Indicators: Aching Pain Intervention(s): Monitored during session;Premedicated before session;Ice applied;Repositioned    Home Living  Family/patient expects to be discharged to:: Private residence Living Arrangements: Spouse/significant other Available Help at Discharge: Available 24 hours/day Type of Home: House Home Access: Stairs to enter Entrance Stairs-Rails: None Home Layout: Two level;1/2 bath on main level;Able to live on main level with bedroom/bathroom Home Equipment: [name] - single point;Shower seat - built in;Hand held shower head      Prior Function Level of Independence: Independent          PT Goals (current goals can now be found in the care plan section) Progress towards PT goals: Progressing toward goals    Frequency    7X/week      PT Plan Current plan remains appropriate    Co-evaluation             End of Session Equipment Utilized During Treatment: Left knee immobilizer Activity Tolerance: Patient limited by pain Patient left: in bed;with call bell/phone within reach     Time: 1456-1533 PT Time Calculation (min) (ACUTE ONLY): 37 min  Charges:  $Gait Training: 23-37 mins $Therapeutic Exercise: 8-22 mins                    G Codes:      Claretha Cooper 02/01/2016, 3:40 PM

## 2016-02-01 NOTE — Progress Notes (Signed)
Subjective: 1 Day Post-Op Procedure(s) (LRB): LEFT TOTAL KNEE ARTHROPLASTY (Left) Patient reports pain as 3 on 0-10 scale.    Objective: Vital signs in last 24 hours: Temp:  [97.9 F (36.6 C)-98.4 F (36.9 C)] 98.2 F (36.8 C) (01/18 0708) Pulse Rate:  [73-94] 87 (01/18 0708) Resp:  [9-19] 16 (01/18 0708) BP: (127-157)/(69-100) 127/76 (01/18 0708) SpO2:  [98 %-100 %] 99 % (01/18 0708) Weight:  [105.7 kg (233 lb)-106.6 kg (235 lb)] 106.6 kg (235 lb) (01/17 1541)  Intake/Output from previous day: 01/17 0701 - 01/18 0700 In: 4006.7 [P.O.:240; I.V.:3601.7; IV Piggyback:165] Out: 1440 [Urine:1300; Drains:115; Blood:25] Intake/Output this shift: Total I/O In: 120 [P.O.:120] Out: 410 [Urine:350; Drains:60]   Recent Labs  02/01/16 0425  HGB 11.6*    Recent Labs  02/01/16 0425  WBC 6.7  RBC 3.43*  HCT 33.1*  PLT 135*    Recent Labs  02/01/16 0425  NA 137  K 4.1  CL 102  CO2 29  BUN 19  CREATININE 0.84  GLUCOSE 135*  CALCIUM 9.1   No results for input(s): LABPT, INR in the last 72 hours.  Neurologically intact No cellulitis present  Assessment/Plan: 1 Day Post-Op Procedure(s) (LRB): LEFT TOTAL KNEE ARTHROPLASTY (Left) Up with therapy  Jamori Biggar A 02/01/2016, 9:25 AM

## 2016-02-02 LAB — BASIC METABOLIC PANEL
Anion gap: 6 (ref 5–15)
BUN: 23 mg/dL — ABNORMAL HIGH (ref 6–20)
CO2: 28 mmol/L (ref 22–32)
Calcium: 9.2 mg/dL (ref 8.9–10.3)
Chloride: 104 mmol/L (ref 101–111)
Creatinine, Ser: 0.81 mg/dL (ref 0.61–1.24)
GFR calc Af Amer: >60 mL/min (ref >60)
GFR calc non Af Amer: >60 mL/min (ref >60)
Glucose, Bld: 140 mg/dL — ABNORMAL HIGH (ref 65–99)
Potassium: 4 mmol/L (ref 3.5–5.1)
Sodium: 138 mmol/L (ref 135–145)

## 2016-02-02 LAB — CBC
HCT: 28.8 % — ABNORMAL LOW (ref 39.0–52.0)
Hemoglobin: 10.4 g/dL — ABNORMAL LOW (ref 13.0–17.0)
MCH: 34.4 pg — ABNORMAL HIGH (ref 26.0–34.0)
MCHC: 36.1 g/dL — ABNORMAL HIGH (ref 30.0–36.0)
MCV: 95.4 fL (ref 78.0–100.0)
Platelets: 133 10*3/uL — ABNORMAL LOW (ref 150–400)
RBC: 3.02 MIL/uL — ABNORMAL LOW (ref 4.22–5.81)
RDW: 13 % (ref 11.5–15.5)
WBC: 9.2 10*3/uL (ref 4.0–10.5)

## 2016-02-02 LAB — GLUCOSE, CAPILLARY
Glucose-Capillary: 119 mg/dL — ABNORMAL HIGH (ref 65–99)
Glucose-Capillary: 222 mg/dL — ABNORMAL HIGH (ref 65–99)

## 2016-02-02 MED ORDER — OXYCODONE-ACETAMINOPHEN 5-325 MG PO TABS: 1.0000 | ORAL_TABLET | ORAL | 0 refills | Status: DC | PRN

## 2016-02-02 NOTE — Progress Notes (Signed)
   Subjective: 2 Days Post-Op Procedure(s) (LRB): LEFT TOTAL KNEE ARTHROPLASTY (Left) Patient reports pain as moderate.   Patient seen in rounds with Dr. Gladstone Lighter. Patient is well, and has had no acute complaints or problems other than pain in the left knee. He reports that he had an acute increase in discomfort last night, but is feeling better this morning. No SOB or chest pain. Voiding well. Positive flatus.  Objective: Vital signs in last 24 hours: Temp:  [97.8 F (36.6 C)-98.1 F (36.7 C)] 97.8 F (36.6 C) (01/18 2152) Pulse Rate:  [96-107] 107 (01/18 2152) Resp:  [15-16] 16 (01/18 2152) BP: (119-168)/(68-81) 168/81 (01/18 2152) SpO2:  [97 %-98 %] 97 % (01/18 2152)  Intake/Output from previous day:  Intake/Output Summary (Last 24 hours) at 02/02/16 0746 Last data filed at 02/02/16 0206  Gross per 24 hour  Intake             1320 ml  Output             1275 ml  Net               45 ml     Labs:  Recent Labs  02/01/16 0425 02/02/16 0423  HGB 11.6* 10.4*    Recent Labs  02/01/16 0425 02/02/16 0423  WBC 6.7 9.2  RBC 3.43* 3.02*  HCT 33.1* 28.8*  PLT 135* 133*    Recent Labs  02/01/16 0425 02/02/16 0423  NA 137 138  K 4.1 4.0  CL 102 104  CO2 29 28  BUN 19 23*  CREATININE 0.84 0.81  GLUCOSE 135* 140*  CALCIUM 9.1 9.2    EXAM General - Patient is Alert and Oriented Extremity - Neurologically intact Intact pulses distally Dorsiflexion/Plantar flexion intact No cellulitis present Compartment soft Dressing/Incision - clean, dry Motor Function - intact, moving foot and toes well on exam.   Past Medical History:  Diagnosis Date  . Anxiety   . Arthritis   . Diabetes mellitus   . Diverticulosis   . Headache   . Hyperlipidemia   . Hypertension   . Neuropathy (HCC)    feet and fingers    Assessment/Plan: 2 Days Post-Op Procedure(s) (LRB): LEFT TOTAL KNEE ARTHROPLASTY (Left) Active Problems:   S/P TKR (total knee replacement) using cement,  right  Estimated body mass index is 32.78 kg/m as calculated from the following:   Height as of this encounter: 5\' 11"  (1.803 m).   Weight as of this encounter: 106.6 kg (235 lb). Advance diet Up with therapy  DVT Prophylaxis - Xarelto Weight-Bearing as tolerated  Plan for session of PT this morning. DC home today. Follow up in office with Dr. Gladstone Lighter in 2 weeks. Start outpatient therapy on Monday. Discharge instructions given. Will transition from Xarelto to aspirin upon DC.   Ardeen Jourdain, PA-C Orthopaedic Surgery 02/02/2016, 7:46 AM

## 2016-02-02 NOTE — Progress Notes (Signed)
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 See Epic for complete PMH.    PT Comments    Ready for DC.  Follow Up Recommendations  Outpatient PT;Supervision/Assistance - 24 hour     Equipment Recommendations  Rolling walker with 5" wheels    Recommendations for Other Services       Precautions / Restrictions Precautions Precautions: Knee;Fall Precaution Comments: H/O neuropathy bilateral LOE's. Charcot ankle Right LE.  Required Braces or Orthoses: Knee Immobilizer - Left;Other Brace/Splint Other Brace/Splint: R ankle brace Restrictions Weight Bearing Restrictions: No    Mobility  Bed Mobility Overal bed mobility: Modified Independent       Supine to sit: Min assist     General bed mobility comments: Assist for LLE  Transfers Overall transfer level: Needs assistance Equipment used: Rolling walker (2 wheeled) Transfers: Sit to/from Stand Sit to Stand: Supervision Stand pivot transfers: Supervision       General transfer comment: VC's for safety and sequencing with RW, hand placement.  Ambulation/Gait Ambulation/Gait assistance: Supervision Ambulation Distance (Feet): 80 Feet Assistive device: Rolling walker (2 wheeled) Gait Pattern/deviations: Step-to pattern;Step-through pattern;Antalgic         Stairs Stairs: Yes   Stair Management: Backwards;Step to pattern;With walker Number of Stairs: 2 General stair comments: cues for sequence and safety  Wheelchair Mobility    Modified Rankin (Stroke Patients Only)       Balance                                    Cognition Arousal/Alertness: Awake/alert Behavior During Therapy: WFL for tasks assessed/performed Overall Cognitive Status: Within Functional Limits for tasks assessed                       Exercises Total Joint Exercises Ankle Circles/Pumps: Both;10 reps Quad Sets: AROM;Both;10 reps Short Arc QuadSinclair Ship;Left;10 reps Heel Slides: AAROM;Left;10 reps Hip ABduction/ADduction: AAROM;Left;10 reps Straight Leg Raises: AAROM;Left;10 reps Long Arc Quad: AAROM;Left;10 reps Goniometric ROM: 10-45 left knee    General Comments General comments (skin integrity, edema, etc.): lost balance during  pullng up the pants, steady assist to regain      Pertinent Vitals/Pain Pain Score: 4  Pain Location: L knee Pain Descriptors / Indicators: Aching Pain Intervention(s): Monitored during session;Premedicated before session;Repositioned;Ice applied    Home Living                      Prior Function            PT Goals (current goals can now be found in the care plan section) Progress towards PT goals: Progressing toward goals    Frequency    7X/week      PT Plan Current plan remains appropriate    Co-evaluation             End of Session Equipment Utilized During Treatment: Left knee immobilizer Activity Tolerance: Patient tolerated treatment well Patient left: in bed;with call bell/phone within reach     Time: [id number] PT Time Calculation (min) (ACUTE ONLY): 45 min  Charges:  $Gait Training: 8-22 mins $Therapeutic Exercise: 8-22 mins $Self Care/Home Management: 8-22  G Codes:      Claretha Cooper 02/02/2016, 1:31 PM

## 2016-02-02 NOTE — Progress Notes (Signed)
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 See Epic for complete PMH.   OT comments  All education was completed this session  Follow Up Recommendations  No OT follow up;Supervision/Assistance - 24 hour    Equipment Recommendations  None recommended by OT    Recommendations for Other Services      Precautions / Restrictions Precautions Precautions: Knee;Fall Precaution Comments: H/O neuropathy bilateral LOE's. Charcot ankle Right LE.  Required Braces or Orthoses: Knee Immobilizer - Left;Other Brace/Splint Other Brace/Splint: R ankle brace Restrictions Weight Bearing Restrictions: No       Mobility Bed Mobility         Supine to sit: Min assist     General bed mobility comments: Assist for LLE  Transfers   Equipment used: Rolling walker (2 wheeled)   Sit to Stand: Min guard         General transfer comment: VC's for safety and sequencing with RW, hand/LE  placement    Balance                                   ADL                           Toilet Transfer: Min guard;RW;Ambulation;Comfort height toilet   Toileting- Clothing Manipulation and Hygiene: Min guard;Sit to/from stand         General ADL Comments: pt stood to use urinal.  Practiced comfort height commode, which he has.  He needed to use both rails and will have family member install a grab bar for him.  He has had a 3:1 in the past and does not want this.  Pt is not ready to step over shower ledge yet:  demonstrated this for him.  He plans to stay downstairs initially and will sponge bathe.  He plans to have family assist with ADLs      Vision                     Perception     Praxis      Cognition   Behavior During Therapy: WFL for tasks assessed/performed Overall Cognitive  Status: Within Functional Limits for tasks assessed                       Extremity/Trunk Assessment               Exercises     Shoulder Instructions       General Comments      Pertinent Vitals/ Pain       Pain Score: 5  Pain Location: L knee Pain Descriptors / Indicators: Sore Pain Intervention(s): Limited activity within patient's tolerance;Monitored during session;Premedicated before session;Repositioned;Ice applied  Home Living                                          Prior Functioning/Environment              Frequency           Progress Toward Goals  OT Goals(current goals can now be found in the care  plan section)  Progress towards OT goals: Progressing toward goals (verbalizes understanding of all: no further OT needs)     Plan      Co-evaluation                 End of Session     Activity Tolerance Patient tolerated treatment well   Patient Left in chair;with call bell/phone within reach;with chair alarm set   Nurse Communication          Time: [phone] OT Time Calculation (min): 29 min  Charges: OT General Charges $OT Visit: 1 Procedure OT Treatments $Self Care/Home Management : 23-37 mins  Raymond Herring 02/02/2016, 11:23 AM  Raymond Herring, OTR/L [phone] 02/02/2016

## 2016-02-02 NOTE — Progress Notes (Signed)
RN reviewed discharge instructions with patient. All questions answered. RW delivered to bedside   Paperwork and prescriptions given.   NT rolled patient down with all belongings to family car.

## 2016-02-05 DIAGNOSIS — Z7409 Other reduced mobility: Secondary | ICD-10-CM | POA: Diagnosis not present

## 2016-02-07 DIAGNOSIS — Z7409 Other reduced mobility: Secondary | ICD-10-CM | POA: Diagnosis not present

## 2016-02-07 NOTE — Discharge Summary (Signed)
Physician Discharge Summary   Patient ID: TELLER WAKEFIELD MRN: [id number] DOB/AGE: 1950-05-26 66 y.o.  Admit date: 01/31/2016 Discharge date: 02/02/2016  Primary Diagnosis: Primary osteoarthritis left knee   Admission Diagnoses:  Past Medical History:  Diagnosis Date  . Anxiety   . Arthritis   . Diabetes mellitus   . Diverticulosis   . Headache   . Hyperlipidemia   . Hypertension   . Neuropathy (HCC)    feet and fingers   Discharge Diagnoses:   Active Problems:   S/P TKR (total knee replacement) using cement, right  Estimated body mass index is 32.78 kg/m as calculated from the following:   Height as of this encounter: 5' 11"  (1.803 m).   Weight as of this encounter: 106.6 kg (235 lb).  Procedure:  Procedure(s) (LRB): LEFT TOTAL KNEE ARTHROPLASTY (Left)   Consults: None  HPI: Micheal Murad Tremont, 66 y.o. male, has a history of pain and functional disability in the left knee due to arthritis and has failed non-surgical conservative treatments for greater than 12 weeks to includeNSAID's and/or analgesics, corticosteriod injections, viscosupplementation injections, flexibility and strengthening excercises and activity modification.  Onset of symptoms was gradual, starting 5 years ago with gradually worsening course since that time. The patient noted prior procedures on the knee to include  menisectomy on the left knee(s).  Patient currently rates pain in the left knee(s) at 7 out of 10 with activity. Patient has night pain, worsening of pain with activity and weight bearing, pain that interferes with activities of daily living, pain with passive range of motion, crepitus and joint swelling.  Patient has evidence of periarticular osteophytes and joint space narrowing by imaging studies. There is no active infection.  Laboratory Data: Admission on 01/31/2016, Discharged on 02/02/2016  Component Date Value Ref Range Status  . Glucose-Capillary 01/31/2016 102* 65 - 99 mg/dL Final  .  Glucose-Capillary 01/31/2016 111* 65 - 99 mg/dL Final  . Comment 1 01/31/2016 Notify RN   Final  . Comment 2 01/31/2016 Document in Chart   Final  . WBC 02/01/2016 6.7  4.0 - 10.5 K/uL Final  . RBC 02/01/2016 3.43* 4.22 - 5.81 MIL/uL Final  . Hemoglobin 02/01/2016 11.6* 13.0 - 17.0 g/dL Final  . HCT 02/01/2016 33.1* 39.0 - 52.0 % Final  . MCV 02/01/2016 96.5  78.0 - 100.0 fL Final  . MCH 02/01/2016 33.8  26.0 - 34.0 pg Final  . MCHC 02/01/2016 35.0  30.0 - 36.0 g/dL Final  . RDW 02/01/2016 13.1  11.5 - 15.5 % Final  . Platelets 02/01/2016 135* 150 - 400 K/uL Final  . Sodium 02/01/2016 137  135 - 145 mmol/L Final  . Potassium 02/01/2016 4.1  3.5 - 5.1 mmol/L Final  . Chloride 02/01/2016 102  101 - 111 mmol/L Final  . CO2 02/01/2016 29  22 - 32 mmol/L Final  . Glucose, Bld 02/01/2016 135* 65 - 99 mg/dL Final  . BUN 02/01/2016 19  6 - 20 mg/dL Final  . Creatinine, Ser 02/01/2016 0.84  0.61 - 1.24 mg/dL Final  . Calcium 02/01/2016 9.1  8.9 - 10.3 mg/dL Final  . GFR calc non Af Amer 02/01/2016 >60  >60 mL/min Final  . GFR calc Af Amer 02/01/2016 >60  >60 mL/min Final   Comment: (NOTE) The eGFR has been calculated using the CKD EPI equation. This calculation has not been validated in all clinical situations. eGFR's persistently <60 mL/min signify possible Chronic Kidney Disease.   Georgiann Hahn gap 02/01/2016  6  5 - 15 Final  . Glucose-Capillary 01/31/2016 140* 65 - 99 mg/dL Final  . Glucose-Capillary 01/31/2016 142* 65 - 99 mg/dL Final  . Glucose-Capillary 02/01/2016 135* 65 - 99 mg/dL Final  . Glucose-Capillary 02/01/2016 129* 65 - 99 mg/dL Final  . Glucose-Capillary 02/01/2016 178* 65 - 99 mg/dL Final  . WBC 02/02/2016 9.2  4.0 - 10.5 K/uL Final  . RBC 02/02/2016 3.02* 4.22 - 5.81 MIL/uL Final  . Hemoglobin 02/02/2016 10.4* 13.0 - 17.0 g/dL Final  . HCT 02/02/2016 28.8* 39.0 - 52.0 % Final  . MCV 02/02/2016 95.4  78.0 - 100.0 fL Final  . MCH 02/02/2016 34.4* 26.0 - 34.0 pg Final  .  MCHC 02/02/2016 36.1* 30.0 - 36.0 g/dL Final  . RDW 02/02/2016 13.0  11.5 - 15.5 % Final  . Platelets 02/02/2016 133* 150 - 400 K/uL Final  . Sodium 02/02/2016 138  135 - 145 mmol/L Final  . Potassium 02/02/2016 4.0  3.5 - 5.1 mmol/L Final  . Chloride 02/02/2016 104  101 - 111 mmol/L Final  . CO2 02/02/2016 28  22 - 32 mmol/L Final  . Glucose, Bld 02/02/2016 140* 65 - 99 mg/dL Final  . BUN 02/02/2016 23* 6 - 20 mg/dL Final  . Creatinine, Ser 02/02/2016 0.81  0.61 - 1.24 mg/dL Final  . Calcium 02/02/2016 9.2  8.9 - 10.3 mg/dL Final  . GFR calc non Af Amer 02/02/2016 >60  >60 mL/min Final  . GFR calc Af Amer 02/02/2016 >60  >60 mL/min Final   Comment: (NOTE) The eGFR has been calculated using the CKD EPI equation. This calculation has not been validated in all clinical situations. eGFR's persistently <60 mL/min signify possible Chronic Kidney Disease.   . Anion gap 02/02/2016 6  5 - 15 Final  . Glucose-Capillary 02/01/2016 185* 65 - 99 mg/dL Final  . Glucose-Capillary 02/02/2016 119* 65 - 99 mg/dL Final  . Glucose-Capillary 02/02/2016 222* 65 - 99 mg/dL Final  Hospital Outpatient Visit on 01/26/2016  Component Date Value Ref Range Status  . Glucose-Capillary 01/26/2016 117* 65 - 99 mg/dL Final  . MRSA, PCR 01/26/2016 NEGATIVE  NEGATIVE Final  . Staphylococcus aureus 01/26/2016 NEGATIVE  NEGATIVE Final   Comment:        The Xpert SA Assay (FDA approved for NASAL specimens in patients over 40 years of age), is one component of a comprehensive surveillance program.  Test performance has been validated by [facility] for patients greater than or equal to 66 year old. It is not intended to diagnose infection nor to guide or monitor treatment.   . Hgb A1c MFr Bld 01/26/2016 5.6  4.8 - 5.6 % Final   Comment: (NOTE)         Pre-diabetes: 5.7 - 6.4         Diabetes: >6.4         Glycemic control for adults with diabetes: <7.0   . Mean Plasma Glucose 01/26/2016 114  mg/dL Final    Comment: (NOTE) Performed At: [facility] [place], [place] [id number] Lindon Romp MD QI:[id number]   . aPTT 01/26/2016 28  24 - 36 seconds Final  . WBC 01/26/2016 5.6  4.0 - 10.5 K/uL Final  . RBC 01/26/2016 4.20* 4.22 - 5.81 MIL/uL Final  . Hemoglobin 01/26/2016 14.1  13.0 - 17.0 g/dL Final  . HCT 01/26/2016 39.9  39.0 - 52.0 % Final  . MCV 01/26/2016 95.0  78.0 - 100.0 fL Final  .  MCH 01/26/2016 33.6  26.0 - 34.0 pg Final  . MCHC 01/26/2016 35.3  30.0 - 36.0 g/dL Final  . RDW 01/26/2016 13.0  11.5 - 15.5 % Final  . Platelets 01/26/2016 176  150 - 400 K/uL Final  . Neutrophils Relative % 01/26/2016 50  % Final  . Neutro Abs 01/26/2016 2.8  1.7 - 7.7 K/uL Final  . Lymphocytes Relative 01/26/2016 34  % Final  . Lymphs Abs 01/26/2016 1.9  0.7 - 4.0 K/uL Final  . Monocytes Relative 01/26/2016 14  % Final  . Monocytes Absolute 01/26/2016 0.8  0.1 - 1.0 K/uL Final  . Eosinophils Relative 01/26/2016 2  % Final  . Eosinophils Absolute 01/26/2016 0.1  0.0 - 0.7 K/uL Final  . Basophils Relative 01/26/2016 0  % Final  . Basophils Absolute 01/26/2016 0.0  0.0 - 0.1 K/uL Final  . Sodium 01/26/2016 139  135 - 145 mmol/L Final  . Potassium 01/26/2016 4.3  3.5 - 5.1 mmol/L Final  . Chloride 01/26/2016 104  101 - 111 mmol/L Final  . CO2 01/26/2016 27  22 - 32 mmol/L Final  . Glucose, Bld 01/26/2016 108* 65 - 99 mg/dL Final  . BUN 01/26/2016 23* 6 - 20 mg/dL Final  . Creatinine, Ser 01/26/2016 1.00  0.61 - 1.24 mg/dL Final  . Calcium 01/26/2016 10.2  8.9 - 10.3 mg/dL Final  . Total Protein 01/26/2016 7.3  6.5 - 8.1 g/dL Final  . Albumin 01/26/2016 4.7  3.5 - 5.0 g/dL Final  . AST 01/26/2016 22  15 - 41 U/L Final  . ALT 01/26/2016 25  17 - 63 U/L Final  . Alkaline Phosphatase 01/26/2016 63  38 - 126 U/L Final  . Total Bilirubin 01/26/2016 1.0  0.3 - 1.2 mg/dL Final  . GFR calc non Af Amer 01/26/2016 >60  >60 mL/min Final  . GFR calc Af Amer 01/26/2016 >60  >60  mL/min Final   Comment: (NOTE) The eGFR has been calculated using the CKD EPI equation. This calculation has not been validated in all clinical situations. eGFR's persistently <60 mL/min signify possible Chronic Kidney Disease.   . Anion gap 01/26/2016 8  5 - 15 Final  . Prothrombin Time 01/26/2016 13.0  11.4 - 15.2 seconds Final  . INR 01/26/2016 0.99   Final  . ABO/RH(D) 01/26/2016 B POS   Final  . Antibody Screen 01/26/2016 NEG   Final  . Sample Expiration 01/26/2016 02/03/2016   Final  . Extend sample reason 01/26/2016 NO TRANSFUSIONS OR PREGNANCY IN THE PAST 3 MONTHS   Final  . Color, Urine 01/26/2016 STRAW* YELLOW Final  . APPearance 01/26/2016 CLEAR  CLEAR Final  . Specific Gravity, Urine 01/26/2016 1.014  1.005 - 1.030 Final  . pH 01/26/2016 5.0  5.0 - 8.0 Final  . Glucose, UA 01/26/2016 >=500* NEGATIVE mg/dL Final  . Hgb urine dipstick 01/26/2016 NEGATIVE  NEGATIVE Final  . Bilirubin Urine 01/26/2016 NEGATIVE  NEGATIVE Final  . Ketones, ur 01/26/2016 NEGATIVE  NEGATIVE mg/dL Final  . Protein, ur 01/26/2016 NEGATIVE  NEGATIVE mg/dL Final  . Nitrite 01/26/2016 NEGATIVE  NEGATIVE Final  . Leukocytes, UA 01/26/2016 NEGATIVE  NEGATIVE Final  . RBC / HPF 01/26/2016 0-5  0 - 5 RBC/hpf Final  . WBC, UA 01/26/2016 0-5  0 - 5 WBC/hpf Final  . Bacteria, UA 01/26/2016 NONE SEEN  NONE SEEN Final  . Squamous Epithelial / LPF 01/26/2016 NONE SEEN  NONE SEEN Final     X-Rays:Dg Chest 2  View  Result Date: 01/26/2016 CLINICAL DATA:  Knee replacement. EXAM: CHEST  2 VIEW COMPARISON:  01/25/2011. FINDINGS: Mediastinum and hilar structures are normal. Lungs are clear. No pleural effusion or pneumothorax. Heart size normal. Degenerative changes thoracic spine. IMPRESSION: No acute cardiopulmonary disease. Electronically Signed   By: Marcello Moores  Register   On: 01/26/2016 14:19    Hospital Course: RACHAEL ZAPANTA is a 67 y.o. who was admitted to [facility]. They were brought to the  operating room on 01/31/2016 and underwent Procedure(s): LEFT TOTAL KNEE ARTHROPLASTY.  Patient tolerated the procedure well and was later transferred to the recovery room and then to the orthopaedic floor for postoperative care.  They were given PO and IV analgesics for pain control following their surgery.  They were given 24 hours of postoperative antibiotics of  Anti-infectives    Start     Dose/Rate Route Frequency Ordered Stop   01/31/16 1700  ceFAZolin (ANCEF) IVPB 1 g/50 mL premix     1 g 100 mL/hr over 30 Minutes Intravenous Every 6 hours 01/31/16 1611 01/31/16 2239   01/31/16 1147  polymyxin B 500,000 Units, bacitracin 50,000 Units in sodium chloride irrigation 0.9 % 500 mL irrigation  Status:  Discontinued       As needed 01/31/16 1147 01/31/16 1330   01/31/16 0922  ceFAZolin (ANCEF) IVPB 2g/100 mL premix     2 g 200 mL/hr over 30 Minutes Intravenous On call to O.R. 01/31/16 6160 01/31/16 1113     and started on DVT prophylaxis in the form of Xarelto.   PT and OT were ordered for total joint protocol.  Discharge planning consulted to help with postop disposition and equipment needs.  Patient had a fair night on the evening of surgery.  They started to get up OOB with therapy on day one. Hemovac drain was pulled without difficulty.  Continued to work with therapy into day two.  Dressing was changed on day two and the incision was clean and dry. The patient had progressed with therapy and meeting their goals.  Incision was healing well.  Patient was seen in rounds and was ready to go home.   Diet: Cardiac diet Activity:WBAT Follow-up:in 2 weeks Disposition - Home Discharged Condition: stable   Discharge Instructions    Call MD / Call 911    Complete by:  As directed    If you experience chest pain or shortness of breath, CALL 911 and be transported to the hospital emergency room.  If you develope a fever above 101 F, pus (white drainage) or increased drainage or redness at the  wound, or calf pain, call your surgeon's office.   Constipation Prevention    Complete by:  As directed    Drink plenty of fluids.  Prune juice may be helpful.  You may use a stool softener, such as Colace (over the counter) 100 mg twice a day.  Use MiraLax (over the counter) for constipation as needed.   Diet - low sodium heart healthy    Complete by:  As directed    Diet Carb Modified    Complete by:  As directed    Discharge instructions    Complete by:  As directed    INSTRUCTIONS AFTER JOINT REPLACEMENT   Remove items at home which could result in a fall. This includes throw rugs or furniture in walking pathways ICE to the affected joint every three hours while awake for 30 minutes at a time, for at least the  first 3-5 days, and then as needed for pain and swelling.  Continue to use ice for pain and swelling. You may notice swelling that will progress down to the foot and ankle.  This is normal after surgery.  Elevate your leg when you are not up walking on it.   Continue to use the breathing machine you got in the hospital (incentive spirometer) which will help keep your temperature down.  It is common for your temperature to cycle up and down following surgery, especially at night when you are not up moving around and exerting yourself.  The breathing machine keeps your lungs expanded and your temperature down.   DIET:  As you were doing prior to hospitalization, we recommend a well-balanced diet.  DRESSING / WOUND CARE / SHOWERING  You may change your dressing every day with sterile gauze.  Please use good hand washing techniques before changing the dressing.  Do not use any lotions or creams on the incision until instructed by your surgeon.  ACTIVITY  Increase activity slowly as tolerated, but follow the weight bearing instructions below.   No driving for 6 weeks or until further direction given by your physician.  You cannot drive while taking narcotics.  No lifting or carrying  greater than 10 lbs. until further directed by your surgeon. Avoid periods of inactivity such as sitting longer than an hour when not asleep. This helps prevent blood clots.  You may return to work once you are authorized by your doctor.     WEIGHT BEARING   Weight bearing as tolerated with assist device (walker, cane, etc) as directed, use it as long as suggested by your surgeon or therapist, typically at least 4-6 weeks.   EXERCISES  Results after joint replacement surgery are often greatly improved when you follow the exercise, range of motion and muscle strengthening exercises prescribed by your doctor. Safety measures are also important to protect the joint from further injury. Any time any of these exercises cause you to have increased pain or swelling, decrease what you are doing until you are comfortable again and then slowly increase them. If you have problems or questions, call your caregiver or physical therapist for advice.   Rehabilitation is important following a joint replacement. After just a few days of immobilization, the muscles of the leg can become weakened and shrink (atrophy).  These exercises are designed to build up the tone and strength of the thigh and leg muscles and to improve motion. Often times heat used for twenty to thirty minutes before working out will loosen up your tissues and help with improving the range of motion but do not use heat for the first two weeks following surgery (sometimes heat can increase post-operative swelling).   These exercises can be done on a training (exercise) mat, on the floor, on a table or on a bed. Use whatever works the best and is most comfortable for you.    Use music or television while you are exercising so that the exercises are a pleasant break in your day. This will make your life better with the exercises acting as a break in your routine that you can look forward to.   Perform all exercises about fifteen times, three times  per day or as directed.  You should exercise both the operative leg and the other leg as well.  Exercises include:   Quad Sets - Tighten up the muscle on the front of the thigh (Quad) and hold for 5-10  seconds.   Straight Leg Raises - With your knee straight (if you were given a brace, keep it on), lift the leg to 60 degrees, hold for 3 seconds, and slowly lower the leg.  Perform this exercise against resistance later as your leg gets stronger.  Leg Slides: Lying on your back, slowly slide your foot toward your buttocks, bending your knee up off the floor (only go as far as is comfortable). Then slowly slide your foot back down until your leg is flat on the floor again.  Angel Wings: Lying on your back spread your legs to the side as far apart as you can without causing discomfort.  Hamstring Strength:  Lying on your back, push your heel against the floor with your leg straight by tightening up the muscles of your buttocks.  Repeat, but this time bend your knee to a comfortable angle, and push your heel against the floor.  You may put a pillow under the heel to make it more comfortable if necessary.   A rehabilitation program following joint replacement surgery can speed recovery and prevent re-injury in the future due to weakened muscles. Contact your doctor or a physical therapist for more information on knee rehabilitation.    CONSTIPATION  Constipation is defined medically as fewer than three stools per week and severe constipation as less than one stool per week.  Even if you have a regular bowel pattern at home, your normal regimen is likely to be disrupted due to multiple reasons following surgery.  Combination of anesthesia, postoperative narcotics, change in appetite and fluid intake all can affect your bowels.   YOU MUST use at least one of the following options; they are listed in order of increasing strength to get the job done.  They are all available over the counter, and you may need  to use some, POSSIBLY even all of these options:    Drink plenty of fluids (prune juice may be helpful) and high fiber foods Colace 100 mg by mouth twice a day  Senokot for constipation as directed and as needed Dulcolax (bisacodyl), take with full glass of water  Miralax (polyethylene glycol) once or twice a day as needed.  If you have tried all these things and are unable to have a bowel movement in the first 3-4 days after surgery call either your surgeon or your primary doctor.    If you experience loose stools or diarrhea, hold the medications until you stool forms back up.  If your symptoms do not get better within 1 week or if they get worse, check with your doctor.  If you experience "the worst abdominal pain ever" or develop nausea or vomiting, please contact the office immediately for further recommendations for treatment.   ITCHING:  If you experience itching with your medications, try taking only a single pain pill, or even half a pain pill at a time.  You can also use Benadryl over the counter for itching or also to help with sleep.   TED HOSE STOCKINGS:  Use stockings on both legs until for at least 2 weeks or as directed by physician office. They may be removed at night for sleeping.  MEDICATIONS:  See your medication summary on the "After Visit Summary" that nursing will review with you.  You may have some home medications which will be placed on hold until you complete the course of blood thinner medication.  It is important for you to complete the blood thinner medication as prescribed.  PRECAUTIONS:  If you experience chest pain or shortness of breath - call 911 immediately for transfer to the hospital emergency department.   If you develop a fever greater that 101 F, purulent drainage from wound, increased redness or drainage from wound, foul odor from the wound/dressing, or calf pain - CONTACT YOUR SURGEON.                                                   FOLLOW-UP  APPOINTMENTS:  If you do not already have a post-op appointment, please call the office for an appointment to be seen by your surgeon.  Guidelines for how soon to be seen are listed in your "After Visit Summary", but are typically between 1-4 weeks after surgery.   MAKE SURE YOU:  Understand these instructions.  Get help right away if you are not doing well or get worse.    Thank you for letting us be a part of your medical care team.  It is a privilege we respect greatly.  We hope these instructions will help you stay on track for a fast and full recovery!   Increase activity slowly as tolerated    Complete by:  As directed      Allergies as of 02/02/2016   No Known Allergies     Medication List    STOP taking these medications   ibuprofen 800 MG tablet Commonly known as:  ADVIL,MOTRIN     TAKE these medications   allopurinol 300 MG tablet Commonly known as:  ZYLOPRIM Take 300 mg by mouth daily.   aspirin EC 325 MG tablet Take 1 tablet (325 mg total) by mouth 2 (two) times daily.   BENEFIBER DRINK MIX PO Take 15 mLs by mouth daily. 1 Tlbs once daily   cilostazol 100 MG tablet Commonly known as:  PLETAL Take 100 mg by mouth 2 (two) times daily.   COLCRYS 0.6 MG tablet Generic drug:  colchicine Take 0.6 mg by mouth daily as needed (for gout).   famotidine 20 MG tablet Commonly known as:  PEPCID Take 20 mg by mouth 3 (three) times daily.   gabapentin 300 MG capsule Commonly known as:  NEURONTIN Take 300 mg by mouth 2 (two) times daily.   Hypromellose 0.3 % Soln Place 1-2 drops into both eyes 3 (three) times daily as needed (for dry eyes). RETAINE [facility] 0.3%   INVOKANA 300 MG Tabs tablet Generic drug:  canagliflozin Take 300 mg by mouth daily.   loperamide 2 MG tablet Commonly known as:  IMODIUM A-D Take 2-4 mg by mouth 4 (four) times daily as needed for diarrhea or loose stools.   losartan 100 MG tablet Commonly known as:  COZAAR Take 100 mg by mouth  daily.   methocarbamol 500 MG tablet Commonly known as:  ROBAXIN Take 1 tablet (500 mg total) by mouth every 6 (six) hours as needed for muscle spasms.   multivitamin with minerals Tabs tablet Take 1 tablet by mouth daily. Centrum Silver   OSTEO BI-FLEX ADV TRIPLE ST PO Take 2 tablets by mouth daily.   oxyCODONE-acetaminophen 5-325 MG tablet Commonly known as:  PERCOCET/ROXICET Take 1-2 tablets by mouth every 4 (four) hours as needed for moderate pain.   phentermine 37.5 MG capsule Take 37.5 mg by mouth daily with breakfast.   pravastatin 20 MG tablet Commonly  known as:  PRAVACHOL Take 20 mg by mouth daily.   propranolol 10 MG tablet Commonly known as:  INDERAL Take 10 mg by mouth 2 (two) times daily.   saccharomyces boulardii 250 MG capsule Commonly known as:  FLORASTOR Take 250 mg by mouth 2 (two) times daily.   sitaGLIPtin 100 MG tablet Commonly known as:  JANUVIA Take 100 mg by mouth daily.   tamsulosin 0.4 MG Caps capsule Commonly known as:  FLOMAX Take 0.4 mg by mouth at bedtime.   VITAMIN B-12 CR PO Take 3,000 mcg by mouth at bedtime.   VITAMIN C PO Take 3,000 mg by mouth daily.   zolpidem 10 MG tablet Commonly known as:  AMBIEN Take 10 mg by mouth at bedtime as needed for sleep.      Follow-up Information    GIOFFRE,RONALD A, MD. Schedule an appointment as soon as possible for a visit in 2 week(s).   Specialty:  Orthopedic Surgery Contact information: [address] [place] [address] [phone]        Marchia Bond PA. Go on 02/05/2016.   Specialty:  Specialist Why:  Patient has a physical therapy appointment at 930am Contact information: [address] [place] [address] [phone]           Signed: Ardeen Jourdain, PA-C Orthopaedic Surgery 02/07/2016, 10:33 AM

## 2016-02-09 DIAGNOSIS — Z96652 Presence of left artificial knee joint: Secondary | ICD-10-CM | POA: Diagnosis not present

## 2016-02-09 DIAGNOSIS — Z471 Aftercare following joint replacement surgery: Secondary | ICD-10-CM | POA: Diagnosis not present

## 2016-02-09 DIAGNOSIS — Z7409 Other reduced mobility: Secondary | ICD-10-CM | POA: Diagnosis not present

## 2016-02-12 DIAGNOSIS — Z7409 Other reduced mobility: Secondary | ICD-10-CM | POA: Diagnosis not present

## 2016-02-14 DIAGNOSIS — Z7409 Other reduced mobility: Secondary | ICD-10-CM | POA: Diagnosis not present

## 2016-02-16 DIAGNOSIS — Z96652 Presence of left artificial knee joint: Secondary | ICD-10-CM | POA: Diagnosis not present

## 2016-02-16 DIAGNOSIS — Z7409 Other reduced mobility: Secondary | ICD-10-CM | POA: Diagnosis not present

## 2016-02-16 DIAGNOSIS — Z471 Aftercare following joint replacement surgery: Secondary | ICD-10-CM | POA: Diagnosis not present

## 2016-02-19 DIAGNOSIS — Z7409 Other reduced mobility: Secondary | ICD-10-CM | POA: Diagnosis not present

## 2016-02-21 DIAGNOSIS — Z7409 Other reduced mobility: Secondary | ICD-10-CM | POA: Diagnosis not present

## 2016-02-23 DIAGNOSIS — Z7409 Other reduced mobility: Secondary | ICD-10-CM | POA: Diagnosis not present

## 2016-02-26 DIAGNOSIS — Z7409 Other reduced mobility: Secondary | ICD-10-CM | POA: Diagnosis not present

## 2016-02-27 DIAGNOSIS — E784 Other hyperlipidemia: Secondary | ICD-10-CM | POA: Diagnosis not present

## 2016-02-27 DIAGNOSIS — M545 Low back pain: Secondary | ICD-10-CM | POA: Diagnosis not present

## 2016-02-27 DIAGNOSIS — Z6830 Body mass index (BMI) 30.0-30.9, adult: Secondary | ICD-10-CM | POA: Diagnosis not present

## 2016-02-27 DIAGNOSIS — Z1389 Encounter for screening for other disorder: Secondary | ICD-10-CM | POA: Diagnosis not present

## 2016-02-27 DIAGNOSIS — M109 Gout, unspecified: Secondary | ICD-10-CM | POA: Diagnosis not present

## 2016-02-27 DIAGNOSIS — E1140 Type 2 diabetes mellitus with diabetic neuropathy, unspecified: Secondary | ICD-10-CM | POA: Diagnosis not present

## 2016-02-27 DIAGNOSIS — E1151 Type 2 diabetes mellitus with diabetic peripheral angiopathy without gangrene: Secondary | ICD-10-CM | POA: Diagnosis not present

## 2016-02-27 DIAGNOSIS — I10 Essential (primary) hypertension: Secondary | ICD-10-CM | POA: Diagnosis not present

## 2016-02-27 DIAGNOSIS — I7389 Other specified peripheral vascular diseases: Secondary | ICD-10-CM | POA: Diagnosis not present

## 2016-02-28 DIAGNOSIS — Z7409 Other reduced mobility: Secondary | ICD-10-CM | POA: Diagnosis not present

## 2016-03-01 DIAGNOSIS — Z7409 Other reduced mobility: Secondary | ICD-10-CM | POA: Diagnosis not present

## 2016-03-04 DIAGNOSIS — Z7409 Other reduced mobility: Secondary | ICD-10-CM | POA: Diagnosis not present

## 2016-03-04 DIAGNOSIS — Z96652 Presence of left artificial knee joint: Secondary | ICD-10-CM | POA: Diagnosis not present

## 2016-03-04 DIAGNOSIS — Z471 Aftercare following joint replacement surgery: Secondary | ICD-10-CM | POA: Diagnosis not present

## 2016-03-06 DIAGNOSIS — Z7409 Other reduced mobility: Secondary | ICD-10-CM | POA: Diagnosis not present

## 2016-03-08 DIAGNOSIS — Z7409 Other reduced mobility: Secondary | ICD-10-CM | POA: Diagnosis not present

## 2016-03-11 DIAGNOSIS — Z7409 Other reduced mobility: Secondary | ICD-10-CM | POA: Diagnosis not present

## 2016-03-13 DIAGNOSIS — Z7409 Other reduced mobility: Secondary | ICD-10-CM | POA: Diagnosis not present

## 2016-03-15 DIAGNOSIS — Z7409 Other reduced mobility: Secondary | ICD-10-CM | POA: Diagnosis not present

## 2016-03-18 DIAGNOSIS — Z96652 Presence of left artificial knee joint: Secondary | ICD-10-CM | POA: Diagnosis not present

## 2016-03-18 DIAGNOSIS — Z471 Aftercare following joint replacement surgery: Secondary | ICD-10-CM | POA: Diagnosis not present

## 2016-03-18 DIAGNOSIS — Z7409 Other reduced mobility: Secondary | ICD-10-CM | POA: Diagnosis not present

## 2016-03-20 DIAGNOSIS — Z7409 Other reduced mobility: Secondary | ICD-10-CM | POA: Diagnosis not present

## 2016-03-22 DIAGNOSIS — Z7409 Other reduced mobility: Secondary | ICD-10-CM | POA: Diagnosis not present

## 2016-03-25 DIAGNOSIS — Z7409 Other reduced mobility: Secondary | ICD-10-CM | POA: Diagnosis not present

## 2016-03-27 DIAGNOSIS — Z7409 Other reduced mobility: Secondary | ICD-10-CM | POA: Diagnosis not present

## 2016-03-29 DIAGNOSIS — Z7409 Other reduced mobility: Secondary | ICD-10-CM | POA: Diagnosis not present

## 2016-04-01 DIAGNOSIS — Z7409 Other reduced mobility: Secondary | ICD-10-CM | POA: Diagnosis not present

## 2016-04-03 ENCOUNTER — Ambulatory Visit (INDEPENDENT_AMBULATORY_CARE_PROVIDER_SITE_OTHER): Payer: Medicare Other | Admitting: Podiatry

## 2016-04-03 ENCOUNTER — Encounter: Payer: Self-pay | Admitting: Podiatry

## 2016-04-03 VITALS — Ht 70.0 in | Wt 235.0 lb

## 2016-04-03 DIAGNOSIS — Z7409 Other reduced mobility: Secondary | ICD-10-CM | POA: Diagnosis not present

## 2016-04-03 DIAGNOSIS — B351 Tinea unguium: Secondary | ICD-10-CM | POA: Diagnosis not present

## 2016-04-03 DIAGNOSIS — M79676 Pain in unspecified toe(s): Secondary | ICD-10-CM

## 2016-04-03 DIAGNOSIS — M21371 Foot drop, right foot: Secondary | ICD-10-CM

## 2016-04-03 NOTE — Progress Notes (Signed)
Patient ID: Raymond Herring, male   DOB: 06/09/1950, 66 y.o.   MRN: [id number] Complaint:  Visit Type: Patient returns to my office for continued preventative foot care services. Complaint: Patient states" my nails have grown long and thick and become painful to walk and wear shoes" Patient has been diagnosed with DM with neuropathy.. The patient presents for preventative foot care services. No changes to ROS   Podiatric Exam: Vascular: dorsalis pedis and posterior tibial pulses are palpable bilateral. Capillary return is immediate. Temperature gradient is WNL. Skin turgor WNL  Sensorium: Normal Semmes Weinstein monofilament test. Normal tactile sensation bilaterally. Nail Exam: Pt has thick disfigured discolored nails with subungual debris noted bilateral entire nail hallux through fifth toenails Ulcer Exam: There is no evidence of ulcer or pre-ulcerative changes or infection. Orthopedic Exam: Muscle tone and strength are WNL. No limitations in general ROM. No crepitus or effusions noted. Foot type and digits show no abnormalities. Bony prominences are unremarkable. Hot painful  swollen  medial aspect right ankle.  Muscle power WNL.    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 Skin: No Porokeratosis. No infection or ulcers  Diagnosis:  Onychomycosis, , Pain in right toe, pain in left toes  Treatment & Plan Procedures and Treatment: Consent by patient was obtained for treatment procedures. The patient understood the discussion of treatment and procedures well. All questions were answered thoroughly reviewed. Debridement of mycotic and hypertrophic toenails, 1 through 5 bilateral and clearing of subungual debris. No ulceration, no infection noted.  Return Visit-Office Procedure: Patient instructed to return to the office for a follow up visit 3 months for continued evaluation and treatment.   Benn Tishina Lown DPM 

## 2016-04-05 DIAGNOSIS — Z7409 Other reduced mobility: Secondary | ICD-10-CM | POA: Diagnosis not present

## 2016-04-08 DIAGNOSIS — Z471 Aftercare following joint replacement surgery: Secondary | ICD-10-CM | POA: Diagnosis not present

## 2016-04-08 DIAGNOSIS — Z96652 Presence of left artificial knee joint: Secondary | ICD-10-CM | POA: Diagnosis not present

## 2016-04-16 DIAGNOSIS — H40013 Open angle with borderline findings, low risk, bilateral: Secondary | ICD-10-CM | POA: Diagnosis not present

## 2016-04-16 DIAGNOSIS — H2513 Age-related nuclear cataract, bilateral: Secondary | ICD-10-CM | POA: Diagnosis not present

## 2016-04-16 DIAGNOSIS — E119 Type 2 diabetes mellitus without complications: Secondary | ICD-10-CM | POA: Diagnosis not present

## 2016-04-16 DIAGNOSIS — H25013 Cortical age-related cataract, bilateral: Secondary | ICD-10-CM | POA: Diagnosis not present

## 2016-04-19 DIAGNOSIS — M25571 Pain in right ankle and joints of right foot: Secondary | ICD-10-CM | POA: Diagnosis not present

## 2016-04-19 DIAGNOSIS — M14671 Charcot's joint, right ankle and foot: Secondary | ICD-10-CM | POA: Diagnosis not present

## 2016-04-23 DIAGNOSIS — L299 Pruritus, unspecified: Secondary | ICD-10-CM | POA: Insufficient documentation

## 2016-05-08 DIAGNOSIS — L218 Other seborrheic dermatitis: Secondary | ICD-10-CM | POA: Diagnosis not present

## 2016-05-20 DIAGNOSIS — Z96651 Presence of right artificial knee joint: Secondary | ICD-10-CM | POA: Diagnosis not present

## 2016-05-20 DIAGNOSIS — Z96652 Presence of left artificial knee joint: Secondary | ICD-10-CM | POA: Diagnosis not present

## 2016-05-20 DIAGNOSIS — M19012 Primary osteoarthritis, left shoulder: Secondary | ICD-10-CM | POA: Diagnosis not present

## 2016-05-21 DIAGNOSIS — E1151 Type 2 diabetes mellitus with diabetic peripheral angiopathy without gangrene: Secondary | ICD-10-CM | POA: Diagnosis not present

## 2016-05-21 DIAGNOSIS — I10 Essential (primary) hypertension: Secondary | ICD-10-CM | POA: Diagnosis not present

## 2016-05-21 DIAGNOSIS — M14679 Charcot's joint, unspecified ankle and foot: Secondary | ICD-10-CM | POA: Diagnosis not present

## 2016-05-21 DIAGNOSIS — Z6833 Body mass index (BMI) 33.0-33.9, adult: Secondary | ICD-10-CM | POA: Diagnosis not present

## 2016-05-21 DIAGNOSIS — E784 Other hyperlipidemia: Secondary | ICD-10-CM | POA: Diagnosis not present

## 2016-05-21 DIAGNOSIS — E1140 Type 2 diabetes mellitus with diabetic neuropathy, unspecified: Secondary | ICD-10-CM | POA: Diagnosis not present

## 2016-06-26 ENCOUNTER — Ambulatory Visit (INDEPENDENT_AMBULATORY_CARE_PROVIDER_SITE_OTHER): Payer: Medicare Other | Admitting: Podiatry

## 2016-06-26 ENCOUNTER — Encounter: Payer: Self-pay | Admitting: Podiatry

## 2016-06-26 DIAGNOSIS — M19071 Primary osteoarthritis, right ankle and foot: Secondary | ICD-10-CM | POA: Diagnosis not present

## 2016-06-26 DIAGNOSIS — B351 Tinea unguium: Secondary | ICD-10-CM | POA: Diagnosis not present

## 2016-06-26 DIAGNOSIS — M79676 Pain in unspecified toe(s): Secondary | ICD-10-CM | POA: Diagnosis not present

## 2016-06-26 DIAGNOSIS — E11610 Type 2 diabetes mellitus with diabetic neuropathic arthropathy: Secondary | ICD-10-CM | POA: Diagnosis not present

## 2016-06-26 DIAGNOSIS — M14671 Charcot's joint, right ankle and foot: Secondary | ICD-10-CM | POA: Diagnosis not present

## 2016-06-26 DIAGNOSIS — M21371 Foot drop, right foot: Secondary | ICD-10-CM

## 2016-06-26 NOTE — Progress Notes (Signed)
Patient ID: Raymond Herring, male   DOB: 05/13/1950, 66 y.o.   MRN: [id number] Complaint:  Visit Type: Patient returns to my office for continued preventative foot care services. Complaint: Patient states" my nails have grown long and thick and become painful to walk and wear shoes" Patient has been diagnosed with DM with neuropathy.. The patient presents for preventative foot care services. No changes to ROS   Podiatric Exam: Vascular: dorsalis pedis and posterior tibial pulses are palpable bilateral. Capillary return is immediate. Temperature gradient is WNL. Skin turgor WNL  Sensorium: Normal Semmes Weinstein monofilament test. Normal tactile sensation bilaterally. Nail Exam: Pt has thick disfigured discolored nails with subungual debris noted bilateral entire nail hallux through fifth toenails Ulcer Exam: There is no evidence of ulcer or pre-ulcerative changes or infection. Orthopedic Exam: Muscle tone and strength are WNL. No limitations in general ROM. No crepitus or effusions noted. Foot type and digits show no abnormalities. Bony prominences are unremarkable. Hot painful  swollen  medial aspect right ankle.  Muscle power WNL.    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 Skin: No Porokeratosis. No infection or ulcers  Diagnosis:  Onychomycosis, , Pain in right toe, pain in left toes  Treatment & Plan Procedures and Treatment: Consent by patient was obtained for treatment procedures. The patient understood the discussion of treatment and procedures well. All questions were answered thoroughly reviewed. Debridement of mycotic and hypertrophic toenails, 1 through 5 bilateral and clearing of subungual debris. No ulceration, no infection noted.  Return Visit-Office Procedure: Patient instructed to return to the office for a follow up visit 3 months for continued evaluation and treatment.   Jiyan Lilygrace Rodick DPM 

## 2016-09-18 ENCOUNTER — Ambulatory Visit (INDEPENDENT_AMBULATORY_CARE_PROVIDER_SITE_OTHER): Payer: Medicare Other | Admitting: Podiatry

## 2016-09-18 DIAGNOSIS — B351 Tinea unguium: Secondary | ICD-10-CM

## 2016-09-18 DIAGNOSIS — M79675 Pain in left toe(s): Secondary | ICD-10-CM

## 2016-09-18 DIAGNOSIS — M21371 Foot drop, right foot: Secondary | ICD-10-CM | POA: Diagnosis not present

## 2016-09-18 DIAGNOSIS — E11610 Type 2 diabetes mellitus with diabetic neuropathic arthropathy: Secondary | ICD-10-CM | POA: Diagnosis not present

## 2016-09-18 DIAGNOSIS — M79674 Pain in right toe(s): Secondary | ICD-10-CM

## 2016-09-18 NOTE — Progress Notes (Signed)
Patient ID: Raymond Herring, male   DOB: [birth year], 66 y.o.   MRN: [id number] Complaint:  Visit Type: Patient returns to my office for continued preventative foot care services. Complaint: Patient states" my nails have grown long and thick and become painful to walk and wear shoes" Patient has been diagnosed with DM with neuropathy.. The patient presents for preventative foot care services. No changes to ROS   Podiatric Exam: Vascular: dorsalis pedis and posterior tibial pulses are palpable bilateral. Capillary return is immediate. Temperature gradient is WNL. Skin turgor WNL  Sensorium: Normal Semmes Weinstein monofilament test. Normal tactile sensation bilaterally. Nail Exam: Pt has thick disfigured discolored nails with subungual debris noted bilateral entire nail hallux through fifth toenails Ulcer Exam: There is no evidence of ulcer or pre-ulcerative changes or infection. Orthopedic Exam: Muscle tone and strength are WNL. No limitations in general ROM. No crepitus or effusions noted. Foot type and digits show no abnormalities. Bony prominences are unremarkable. Hot painful  swollen  medial aspect right ankle.  Muscle power WNL.    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 Skin: No Porokeratosis. No infection or ulcers  Diagnosis:  Onychomycosis, , Pain in right toe, pain in left toes  Treatment & Plan Procedures and Treatment: Consent by patient was obtained for treatment procedures. The patient understood the discussion of treatment and procedures well. All questions were answered thoroughly reviewed. Debridement of mycotic and hypertrophic toenails, 1 through 5 bilateral and clearing of subungual debris. No ulceration, no infection noted.  Return Visit-Office Procedure: Patient instructed to return to the office for a follow up visit 3 months for continued evaluation and treatment.   Gardiner Barefoot DPM

## 2016-09-20 DIAGNOSIS — Z6836 Body mass index (BMI) 36.0-36.9, adult: Secondary | ICD-10-CM | POA: Diagnosis not present

## 2016-09-20 DIAGNOSIS — I10 Essential (primary) hypertension: Secondary | ICD-10-CM | POA: Diagnosis not present

## 2016-09-20 DIAGNOSIS — I7389 Other specified peripheral vascular diseases: Secondary | ICD-10-CM | POA: Diagnosis not present

## 2016-09-20 DIAGNOSIS — M14679 Charcot's joint, unspecified ankle and foot: Secondary | ICD-10-CM | POA: Diagnosis not present

## 2016-09-20 DIAGNOSIS — E1151 Type 2 diabetes mellitus with diabetic peripheral angiopathy without gangrene: Secondary | ICD-10-CM | POA: Diagnosis not present

## 2016-09-20 DIAGNOSIS — M545 Low back pain: Secondary | ICD-10-CM | POA: Diagnosis not present

## 2016-09-20 DIAGNOSIS — E784 Other hyperlipidemia: Secondary | ICD-10-CM | POA: Diagnosis not present

## 2016-09-20 DIAGNOSIS — E1140 Type 2 diabetes mellitus with diabetic neuropathy, unspecified: Secondary | ICD-10-CM | POA: Diagnosis not present

## 2016-10-03 DIAGNOSIS — Z96651 Presence of right artificial knee joint: Secondary | ICD-10-CM | POA: Diagnosis not present

## 2016-10-03 DIAGNOSIS — M25561 Pain in right knee: Secondary | ICD-10-CM | POA: Diagnosis not present

## 2016-10-03 DIAGNOSIS — M25562 Pain in left knee: Secondary | ICD-10-CM | POA: Diagnosis not present

## 2016-10-03 DIAGNOSIS — Z96652 Presence of left artificial knee joint: Secondary | ICD-10-CM | POA: Diagnosis not present

## 2016-10-03 DIAGNOSIS — G8929 Other chronic pain: Secondary | ICD-10-CM | POA: Diagnosis not present

## 2016-10-14 DIAGNOSIS — M19071 Primary osteoarthritis, right ankle and foot: Secondary | ICD-10-CM | POA: Diagnosis not present

## 2016-10-14 DIAGNOSIS — M14671 Charcot's joint, right ankle and foot: Secondary | ICD-10-CM | POA: Diagnosis not present

## 2016-10-15 DIAGNOSIS — Z96651 Presence of right artificial knee joint: Secondary | ICD-10-CM | POA: Diagnosis not present

## 2016-10-15 DIAGNOSIS — G8929 Other chronic pain: Secondary | ICD-10-CM | POA: Diagnosis not present

## 2016-10-15 DIAGNOSIS — M25561 Pain in right knee: Secondary | ICD-10-CM | POA: Diagnosis not present

## 2016-11-14 DIAGNOSIS — Z96651 Presence of right artificial knee joint: Secondary | ICD-10-CM | POA: Diagnosis not present

## 2016-11-14 DIAGNOSIS — Z96652 Presence of left artificial knee joint: Secondary | ICD-10-CM | POA: Diagnosis not present

## 2016-12-18 ENCOUNTER — Encounter: Payer: Self-pay | Admitting: Podiatry

## 2016-12-18 ENCOUNTER — Ambulatory Visit (INDEPENDENT_AMBULATORY_CARE_PROVIDER_SITE_OTHER): Payer: Medicare Other | Admitting: Podiatry

## 2016-12-18 DIAGNOSIS — B351 Tinea unguium: Secondary | ICD-10-CM | POA: Diagnosis not present

## 2016-12-18 DIAGNOSIS — M79674 Pain in right toe(s): Secondary | ICD-10-CM | POA: Diagnosis not present

## 2016-12-18 DIAGNOSIS — E11610 Type 2 diabetes mellitus with diabetic neuropathic arthropathy: Secondary | ICD-10-CM

## 2016-12-18 DIAGNOSIS — M79675 Pain in left toe(s): Secondary | ICD-10-CM

## 2016-12-18 DIAGNOSIS — D689 Coagulation defect, unspecified: Secondary | ICD-10-CM

## 2016-12-18 NOTE — Progress Notes (Addendum)
Patient ID: Raymond Herring, male   DOB: [birth year], 66 y.o.   MRN: [id number] Complaint:  Visit Type: Patient returns to my office for continued preventative foot care services. Complaint: Patient states" my nails have grown long and thick and become painful to walk and wear shoes" Patient has been diagnosed with DM with neuropathy.. The patient presents for preventative foot care services. No changes to ROS .  Patient is taking pletal.  Podiatric Exam: Vascular: dorsalis pedis and posterior tibial pulses are palpable bilateral. Capillary return is immediate. Temperature gradient is WNL. Skin turgor WNL  Sensorium: Normal Semmes Weinstein monofilament test. Normal tactile sensation bilaterally. Nail Exam: Pt has thick disfigured discolored nails with subungual debris noted bilateral entire nail hallux through fifth toenails Ulcer Exam: There is no evidence of ulcer or pre-ulcerative changes or infection. Orthopedic Exam: Muscle tone and strength are WNL. No limitations in general ROM. No crepitus or effusions noted. Foot type and digits show no abnormalities. Bony prominences are unremarkable. Hot painful  swollen  medial aspect right ankle.  Muscle power WNL.    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 Skin: No Porokeratosis. No infection or ulcers  Diagnosis:  Onychomycosis, , Pain in right toe, pain in left toes  Treatment & Plan Procedures and Treatment: Consent by patient was obtained for treatment procedures. The patient understood the discussion of treatment and procedures well. All questions were answered thoroughly reviewed. Debridement of mycotic and hypertrophic toenails, 1 through 5 bilateral and clearing of subungual debris. No ulceration, no infection noted. ABN signed for 2018. Return Visit-Office Procedure: Patient instructed to return to the office for a follow up visit 3 months for continued evaluation and treatment.   Gardiner Barefoot DPM

## 2016-12-19 DIAGNOSIS — R82998 Other abnormal findings in urine: Secondary | ICD-10-CM | POA: Diagnosis not present

## 2016-12-19 DIAGNOSIS — E7849 Other hyperlipidemia: Secondary | ICD-10-CM | POA: Diagnosis not present

## 2016-12-19 DIAGNOSIS — I10 Essential (primary) hypertension: Secondary | ICD-10-CM | POA: Diagnosis not present

## 2016-12-19 DIAGNOSIS — E1151 Type 2 diabetes mellitus with diabetic peripheral angiopathy without gangrene: Secondary | ICD-10-CM | POA: Diagnosis not present

## 2016-12-19 DIAGNOSIS — Z125 Encounter for screening for malignant neoplasm of prostate: Secondary | ICD-10-CM | POA: Diagnosis not present

## 2016-12-19 DIAGNOSIS — M109 Gout, unspecified: Secondary | ICD-10-CM | POA: Diagnosis not present

## 2016-12-26 DIAGNOSIS — I7389 Other specified peripheral vascular diseases: Secondary | ICD-10-CM | POA: Diagnosis not present

## 2016-12-26 DIAGNOSIS — Z0000 Encounter for general adult medical examination without abnormal findings: Secondary | ICD-10-CM | POA: Diagnosis not present

## 2016-12-26 DIAGNOSIS — E7849 Other hyperlipidemia: Secondary | ICD-10-CM | POA: Diagnosis not present

## 2016-12-26 DIAGNOSIS — E1140 Type 2 diabetes mellitus with diabetic neuropathy, unspecified: Secondary | ICD-10-CM | POA: Diagnosis not present

## 2016-12-26 DIAGNOSIS — M109 Gout, unspecified: Secondary | ICD-10-CM | POA: Diagnosis not present

## 2016-12-26 DIAGNOSIS — Z1389 Encounter for screening for other disorder: Secondary | ICD-10-CM | POA: Diagnosis not present

## 2016-12-26 DIAGNOSIS — Z6835 Body mass index (BMI) 35.0-35.9, adult: Secondary | ICD-10-CM | POA: Diagnosis not present

## 2016-12-26 DIAGNOSIS — M6530 Trigger finger, unspecified finger: Secondary | ICD-10-CM | POA: Insufficient documentation

## 2016-12-26 DIAGNOSIS — M14679 Charcot's joint, unspecified ankle and foot: Secondary | ICD-10-CM | POA: Diagnosis not present

## 2016-12-26 DIAGNOSIS — M65322 Trigger finger, left index finger: Secondary | ICD-10-CM | POA: Diagnosis not present

## 2016-12-26 DIAGNOSIS — E668 Other obesity: Secondary | ICD-10-CM | POA: Diagnosis not present

## 2016-12-26 DIAGNOSIS — I10 Essential (primary) hypertension: Secondary | ICD-10-CM | POA: Diagnosis not present

## 2016-12-26 DIAGNOSIS — E1149 Type 2 diabetes mellitus with other diabetic neurological complication: Secondary | ICD-10-CM | POA: Diagnosis not present

## 2016-12-27 DIAGNOSIS — Z1212 Encounter for screening for malignant neoplasm of rectum: Secondary | ICD-10-CM | POA: Diagnosis not present

## 2017-01-09 DIAGNOSIS — M19012 Primary osteoarthritis, left shoulder: Secondary | ICD-10-CM | POA: Diagnosis not present

## 2017-01-15 DIAGNOSIS — M25512 Pain in left shoulder: Secondary | ICD-10-CM | POA: Diagnosis not present

## 2017-01-15 DIAGNOSIS — M25519 Pain in unspecified shoulder: Secondary | ICD-10-CM | POA: Insufficient documentation

## 2017-01-21 DIAGNOSIS — M13812 Other specified arthritis, left shoulder: Secondary | ICD-10-CM | POA: Diagnosis not present

## 2017-03-12 DIAGNOSIS — L821 Other seborrheic keratosis: Secondary | ICD-10-CM | POA: Diagnosis not present

## 2017-03-12 DIAGNOSIS — D2262 Melanocytic nevi of left upper limb, including shoulder: Secondary | ICD-10-CM | POA: Diagnosis not present

## 2017-03-12 DIAGNOSIS — L570 Actinic keratosis: Secondary | ICD-10-CM | POA: Diagnosis not present

## 2017-03-12 DIAGNOSIS — L218 Other seborrheic dermatitis: Secondary | ICD-10-CM | POA: Diagnosis not present

## 2017-03-21 ENCOUNTER — Encounter: Payer: Self-pay | Admitting: Podiatry

## 2017-03-21 ENCOUNTER — Ambulatory Visit (INDEPENDENT_AMBULATORY_CARE_PROVIDER_SITE_OTHER): Payer: Medicare Other | Admitting: Podiatry

## 2017-03-21 DIAGNOSIS — E11610 Type 2 diabetes mellitus with diabetic neuropathic arthropathy: Secondary | ICD-10-CM

## 2017-03-21 DIAGNOSIS — B351 Tinea unguium: Secondary | ICD-10-CM | POA: Diagnosis not present

## 2017-03-21 DIAGNOSIS — D689 Coagulation defect, unspecified: Secondary | ICD-10-CM

## 2017-03-21 DIAGNOSIS — M79675 Pain in left toe(s): Secondary | ICD-10-CM | POA: Diagnosis not present

## 2017-03-21 DIAGNOSIS — M79674 Pain in right toe(s): Secondary | ICD-10-CM

## 2017-03-21 NOTE — Progress Notes (Signed)
Patient ID: Raymond Herring, male   DOB: 09/30/1950, 67 y.o.   MRN: [id number] Complaint:  Visit Type: Patient returns to my office for continued preventative foot care services. Complaint: Patient states" my nails have grown long and thick and become painful to walk and wear shoes" Patient has been diagnosed with DM with neuropathy.. The patient presents for preventative foot care services. No changes to ROS .  Patient is taking pletal.  Podiatric Exam: Vascular: dorsalis pedis and posterior tibial pulses are palpable bilateral. Capillary return is immediate. Temperature gradient is WNL. Skin turgor WNL  Sensorium: Normal Semmes Weinstein monofilament test. Normal tactile sensation bilaterally. Nail Exam: Pt has thick disfigured discolored nails with subungual debris noted bilateral entire nail hallux through fifth toenails Ulcer Exam: There is no evidence of ulcer or pre-ulcerative changes or infection. Orthopedic Exam: Muscle tone and strength are WNL. No limitations in general ROM. No crepitus or effusions noted. Foot type and digits show no abnormalities. Bony prominences are unremarkable. Hot painful  swollen  medial aspect right ankle.  Muscle power WNL.    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 Skin: No Porokeratosis. No infection or ulcers  Diagnosis:  Onychomycosis, , Pain in right toe, pain in left toes  Treatment & Plan Procedures and Treatment: Consent by patient was obtained for treatment procedures. The patient understood the discussion of treatment and procedures well. All questions were answered thoroughly reviewed. Debridement of mycotic and hypertrophic toenails, 1 through 5 bilateral and clearing of subungual debris. No ulceration, no infection noted. ABN signed for 2019. Return Visit-Office Procedure: Patient instructed to return to the office for a follow up visit 3 months for continued evaluation and treatment.   Yakov Tiffanny Lamarche DPM 

## 2017-05-13 DIAGNOSIS — H2513 Age-related nuclear cataract, bilateral: Secondary | ICD-10-CM | POA: Diagnosis not present

## 2017-05-13 DIAGNOSIS — E119 Type 2 diabetes mellitus without complications: Secondary | ICD-10-CM | POA: Diagnosis not present

## 2017-05-13 DIAGNOSIS — H40013 Open angle with borderline findings, low risk, bilateral: Secondary | ICD-10-CM | POA: Diagnosis not present

## 2017-05-13 DIAGNOSIS — H43822 Vitreomacular adhesion, left eye: Secondary | ICD-10-CM | POA: Diagnosis not present

## 2017-05-26 DIAGNOSIS — Z1389 Encounter for screening for other disorder: Secondary | ICD-10-CM | POA: Diagnosis not present

## 2017-05-26 DIAGNOSIS — E7849 Other hyperlipidemia: Secondary | ICD-10-CM | POA: Diagnosis not present

## 2017-05-26 DIAGNOSIS — M14679 Charcot's joint, unspecified ankle and foot: Secondary | ICD-10-CM | POA: Diagnosis not present

## 2017-05-26 DIAGNOSIS — I7389 Other specified peripheral vascular diseases: Secondary | ICD-10-CM | POA: Diagnosis not present

## 2017-05-26 DIAGNOSIS — I10 Essential (primary) hypertension: Secondary | ICD-10-CM | POA: Diagnosis not present

## 2017-05-26 DIAGNOSIS — E1151 Type 2 diabetes mellitus with diabetic peripheral angiopathy without gangrene: Secondary | ICD-10-CM | POA: Diagnosis not present

## 2017-05-26 DIAGNOSIS — Z6835 Body mass index (BMI) 35.0-35.9, adult: Secondary | ICD-10-CM | POA: Diagnosis not present

## 2017-06-18 ENCOUNTER — Ambulatory Visit (INDEPENDENT_AMBULATORY_CARE_PROVIDER_SITE_OTHER): Payer: Medicare Other | Admitting: Podiatry

## 2017-06-18 ENCOUNTER — Encounter: Payer: Self-pay | Admitting: Podiatry

## 2017-06-18 DIAGNOSIS — M79675 Pain in left toe(s): Secondary | ICD-10-CM | POA: Diagnosis not present

## 2017-06-18 DIAGNOSIS — M79674 Pain in right toe(s): Secondary | ICD-10-CM | POA: Diagnosis not present

## 2017-06-18 DIAGNOSIS — B351 Tinea unguium: Secondary | ICD-10-CM | POA: Diagnosis not present

## 2017-06-18 DIAGNOSIS — E11610 Type 2 diabetes mellitus with diabetic neuropathic arthropathy: Secondary | ICD-10-CM

## 2017-06-18 DIAGNOSIS — D689 Coagulation defect, unspecified: Secondary | ICD-10-CM

## 2017-06-18 NOTE — Progress Notes (Signed)
Patient ID: [name], male   DOB: 07/15/1950, 67 y.o.   MRN: [id number] Complaint:  Visit Type: Patient returns to my office for continued preventative foot care services. Complaint: Patient states" my nails have grown long and thick and become painful to walk and wear shoes" Patient has been diagnosed with DM with neuropathy.. The patient presents for preventative foot care services. No changes to ROS .  Patient is taking pletal.  Podiatric Exam: Vascular: dorsalis pedis and posterior tibial pulses are palpable bilateral. Capillary return is immediate. Temperature gradient is WNL. Skin turgor WNL  Sensorium: Normal Semmes Weinstein monofilament test. Normal tactile sensation bilaterally. Nail Exam: Pt has thick disfigured discolored nails with subungual debris noted bilateral entire nail hallux through fifth toenails Ulcer Exam: There is no evidence of ulcer or pre-ulcerative changes or infection. Orthopedic Exam: Muscle tone and strength are WNL. No limitations in general ROM. No crepitus or effusions noted. Foot type and digits show no abnormalities. Bony prominences are unremarkable. Hot painful  swollen  medial aspect right ankle.  Muscle power WNL.    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 Skin: No Porokeratosis. No infection or ulcers  Diagnosis:  Onychomycosis, , Pain in right toe, pain in left toes  Treatment & Plan Procedures and Treatment: Consent by patient was obtained for treatment procedures. The patient understood the discussion of treatment and procedures well. All questions were answered thoroughly reviewed. Debridement of mycotic and hypertrophic toenails, 1 through 5 bilateral and clearing of subungual debris. No ulceration, no infection noted. ABN signed for 2019. Return Visit-Office Procedure: Patient instructed to return to the office for a follow up visit 3 months for continued evaluation and treatment.   Helder Kathya Wilz DPM 

## 2017-06-19 DIAGNOSIS — L309 Dermatitis, unspecified: Secondary | ICD-10-CM | POA: Diagnosis not present

## 2017-06-19 DIAGNOSIS — L918 Other hypertrophic disorders of the skin: Secondary | ICD-10-CM | POA: Diagnosis not present

## 2017-06-19 DIAGNOSIS — L720 Epidermal cyst: Secondary | ICD-10-CM | POA: Diagnosis not present

## 2017-06-19 DIAGNOSIS — L821 Other seborrheic keratosis: Secondary | ICD-10-CM | POA: Diagnosis not present

## 2017-06-19 DIAGNOSIS — L218 Other seborrheic dermatitis: Secondary | ICD-10-CM | POA: Diagnosis not present

## 2017-06-20 DIAGNOSIS — M25571 Pain in right ankle and joints of right foot: Secondary | ICD-10-CM | POA: Diagnosis not present

## 2017-06-20 DIAGNOSIS — M14671 Charcot's joint, right ankle and foot: Secondary | ICD-10-CM | POA: Diagnosis not present

## 2017-06-30 DIAGNOSIS — W57XXXA Bitten or stung by nonvenomous insect and other nonvenomous arthropods, initial encounter: Secondary | ICD-10-CM | POA: Diagnosis not present

## 2017-06-30 DIAGNOSIS — Z6833 Body mass index (BMI) 33.0-33.9, adult: Secondary | ICD-10-CM | POA: Diagnosis not present

## 2017-06-30 DIAGNOSIS — R748 Abnormal levels of other serum enzymes: Secondary | ICD-10-CM | POA: Diagnosis not present

## 2017-06-30 DIAGNOSIS — R6889 Other general symptoms and signs: Secondary | ICD-10-CM | POA: Diagnosis not present

## 2017-06-30 DIAGNOSIS — E1151 Type 2 diabetes mellitus with diabetic peripheral angiopathy without gangrene: Secondary | ICD-10-CM | POA: Diagnosis not present

## 2017-06-30 DIAGNOSIS — R509 Fever, unspecified: Secondary | ICD-10-CM | POA: Diagnosis not present

## 2017-06-30 DIAGNOSIS — I10 Essential (primary) hypertension: Secondary | ICD-10-CM | POA: Diagnosis not present

## 2017-07-01 DIAGNOSIS — R7989 Other specified abnormal findings of blood chemistry: Secondary | ICD-10-CM | POA: Diagnosis not present

## 2017-09-17 ENCOUNTER — Ambulatory Visit (INDEPENDENT_AMBULATORY_CARE_PROVIDER_SITE_OTHER): Payer: Medicare Other | Admitting: Podiatry

## 2017-09-17 ENCOUNTER — Encounter: Payer: Self-pay | Admitting: Podiatry

## 2017-09-17 DIAGNOSIS — B351 Tinea unguium: Secondary | ICD-10-CM | POA: Diagnosis not present

## 2017-09-17 DIAGNOSIS — M79675 Pain in left toe(s): Secondary | ICD-10-CM

## 2017-09-17 DIAGNOSIS — D689 Coagulation defect, unspecified: Secondary | ICD-10-CM

## 2017-09-17 DIAGNOSIS — M79674 Pain in right toe(s): Secondary | ICD-10-CM | POA: Diagnosis not present

## 2017-09-17 DIAGNOSIS — E11610 Type 2 diabetes mellitus with diabetic neuropathic arthropathy: Secondary | ICD-10-CM

## 2017-09-17 NOTE — Progress Notes (Signed)
Patient ID: Raymond Herring, male   DOB: 1950/12/16, 67 y.o.   MRN: [id number] Complaint:  Visit Type: Patient returns to my office for continued preventative foot care services. Complaint: Patient states" my nails have grown long and thick and become painful to walk and wear shoes" Patient has been diagnosed with DM with neuropathy.. The patient presents for preventative foot care services. No changes to ROS .  Patient is taking pletal.  Podiatric Exam: Vascular: dorsalis pedis and posterior tibial pulses are palpable bilateral. Capillary return is immediate. Temperature gradient is WNL. Skin turgor WNL  Sensorium: Normal Semmes Weinstein monofilament test. Normal tactile sensation bilaterally. Nail Exam: Pt has thick disfigured discolored nails with subungual debris noted bilateral entire nail hallux through fifth toenails Ulcer Exam: There is no evidence of ulcer or pre-ulcerative changes or infection. Orthopedic Exam: Muscle tone and strength are WNL. No limitations in general ROM. No crepitus or effusions noted. Foot type and digits show no abnormalities. Bony prominences are unremarkable. Hot painful  swollen  medial aspect right ankle.  Muscle power WNL.    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 Skin: No Porokeratosis. No infection or ulcers  Diagnosis:  Onychomycosis, , Pain in right toe, pain in left toes,  Diabetes with neuropathy.  Treatment & Plan Procedures and Treatment: Consent by patient was obtained for treatment procedures. The patient understood the discussion of treatment and procedures well. All questions were answered thoroughly reviewed. Debridement of mycotic and hypertrophic toenails, 1 through 5 bilateral and clearing of subungual debris. No ulceration, no infection noted. ABN signed for 2019. Return Visit-Office Procedure: Patient instructed to return to the office for a follow up visit 3 months for continued evaluation and  treatment.   Gardiner Barefoot DPM

## 2017-09-29 DIAGNOSIS — M13812 Other specified arthritis, left shoulder: Secondary | ICD-10-CM | POA: Diagnosis not present

## 2017-09-29 DIAGNOSIS — M170 Bilateral primary osteoarthritis of knee: Secondary | ICD-10-CM | POA: Diagnosis not present

## 2017-10-17 DIAGNOSIS — M545 Low back pain: Secondary | ICD-10-CM | POA: Diagnosis not present

## 2017-10-17 DIAGNOSIS — E668 Other obesity: Secondary | ICD-10-CM | POA: Diagnosis not present

## 2017-10-17 DIAGNOSIS — E1140 Type 2 diabetes mellitus with diabetic neuropathy, unspecified: Secondary | ICD-10-CM | POA: Diagnosis not present

## 2017-10-17 DIAGNOSIS — I7389 Other specified peripheral vascular diseases: Secondary | ICD-10-CM | POA: Diagnosis not present

## 2017-10-17 DIAGNOSIS — E7849 Other hyperlipidemia: Secondary | ICD-10-CM | POA: Diagnosis not present

## 2017-10-17 DIAGNOSIS — E1149 Type 2 diabetes mellitus with other diabetic neurological complication: Secondary | ICD-10-CM | POA: Diagnosis not present

## 2017-10-17 DIAGNOSIS — I10 Essential (primary) hypertension: Secondary | ICD-10-CM | POA: Diagnosis not present

## 2017-10-17 DIAGNOSIS — Z6832 Body mass index (BMI) 32.0-32.9, adult: Secondary | ICD-10-CM | POA: Diagnosis not present

## 2017-10-20 DIAGNOSIS — M25474 Effusion, right foot: Secondary | ICD-10-CM | POA: Diagnosis not present

## 2017-10-20 DIAGNOSIS — M79671 Pain in right foot: Secondary | ICD-10-CM | POA: Diagnosis not present

## 2017-11-11 DIAGNOSIS — H40013 Open angle with borderline findings, low risk, bilateral: Secondary | ICD-10-CM | POA: Diagnosis not present

## 2017-12-17 ENCOUNTER — Encounter: Payer: Self-pay | Admitting: Podiatry

## 2017-12-17 ENCOUNTER — Ambulatory Visit (INDEPENDENT_AMBULATORY_CARE_PROVIDER_SITE_OTHER): Payer: Medicare Other | Admitting: Podiatry

## 2017-12-17 DIAGNOSIS — B351 Tinea unguium: Secondary | ICD-10-CM | POA: Diagnosis not present

## 2017-12-17 DIAGNOSIS — M79674 Pain in right toe(s): Secondary | ICD-10-CM | POA: Diagnosis not present

## 2017-12-17 DIAGNOSIS — M79675 Pain in left toe(s): Secondary | ICD-10-CM

## 2017-12-17 DIAGNOSIS — E11610 Type 2 diabetes mellitus with diabetic neuropathic arthropathy: Secondary | ICD-10-CM

## 2017-12-17 DIAGNOSIS — D689 Coagulation defect, unspecified: Secondary | ICD-10-CM

## 2017-12-17 NOTE — Addendum Note (Signed)
Addended byDeidre Ala, Leelan Rajewski L on: 12/17/2017 08:26 AM   Modules accepted: Orders

## 2017-12-17 NOTE — Progress Notes (Signed)
Patient ID: Raymond Herring, male   DOB: 05-30-1950, 67 y.o.   MRN: [id number] Complaint:  Visit Type: Patient returns to my office for continued preventative foot care services. Complaint: Patient states" my nails have grown long and thick and become painful to walk and wear shoes" Patient has been diagnosed with DM with neuropathy.. The patient presents for preventative foot care services. No changes to ROS .  Patient is taking pletal.  Podiatric Exam: Vascular: dorsalis pedis and posterior tibial pulses are palpable bilateral. Capillary return is immediate. Temperature gradient is WNL. Skin turgor WNL  Sensorium: Normal Semmes Weinstein monofilament test. Normal tactile sensation bilaterally. Nail Exam: Pt has thick disfigured discolored nails with subungual debris noted bilateral entire nail hallux through fifth toenails Ulcer Exam: There is no evidence of ulcer or pre-ulcerative changes or infection. Orthopedic Exam: Muscle tone and strength are WNL. No limitations in general ROM. No crepitus or effusions noted. Foot type and digits show no abnormalities. Bony prominences are unremarkable. Hot painful  swollen  medial aspect right ankle.  Muscle power WNL.    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 Skin: No Porokeratosis. No infection or ulcers  Diagnosis:  Onychomycosis, , Pain in right toe, pain in left toes  Treatment & Plan Procedures and Treatment: Consent by patient was obtained for treatment procedures. The patient understood the discussion of treatment and procedures well. All questions were answered thoroughly reviewed. Debridement of mycotic and hypertrophic toenails, 1 through 5 bilateral and clearing of subungual debris. No ulceration, no infection noted. ABN signed for 2019. Return Visit-Office Procedure: Patient instructed to return to the office for a follow up visit 3 months for continued evaluation and treatment.   Gardiner Barefoot DPM

## 2017-12-22 DIAGNOSIS — M109 Gout, unspecified: Secondary | ICD-10-CM | POA: Diagnosis not present

## 2017-12-22 DIAGNOSIS — R82998 Other abnormal findings in urine: Secondary | ICD-10-CM | POA: Diagnosis not present

## 2017-12-22 DIAGNOSIS — I10 Essential (primary) hypertension: Secondary | ICD-10-CM | POA: Diagnosis not present

## 2017-12-22 DIAGNOSIS — Z125 Encounter for screening for malignant neoplasm of prostate: Secondary | ICD-10-CM | POA: Diagnosis not present

## 2017-12-22 DIAGNOSIS — E1151 Type 2 diabetes mellitus with diabetic peripheral angiopathy without gangrene: Secondary | ICD-10-CM | POA: Diagnosis not present

## 2017-12-22 DIAGNOSIS — E7849 Other hyperlipidemia: Secondary | ICD-10-CM | POA: Diagnosis not present

## 2017-12-29 DIAGNOSIS — E668 Other obesity: Secondary | ICD-10-CM | POA: Diagnosis not present

## 2017-12-29 DIAGNOSIS — I7389 Other specified peripheral vascular diseases: Secondary | ICD-10-CM | POA: Diagnosis not present

## 2017-12-29 DIAGNOSIS — Z1389 Encounter for screening for other disorder: Secondary | ICD-10-CM | POA: Diagnosis not present

## 2017-12-29 DIAGNOSIS — Z0000 Encounter for general adult medical examination without abnormal findings: Secondary | ICD-10-CM | POA: Diagnosis not present

## 2017-12-29 DIAGNOSIS — E1140 Type 2 diabetes mellitus with diabetic neuropathy, unspecified: Secondary | ICD-10-CM | POA: Diagnosis not present

## 2017-12-29 DIAGNOSIS — R972 Elevated prostate specific antigen [PSA]: Secondary | ICD-10-CM | POA: Diagnosis not present

## 2017-12-29 DIAGNOSIS — Z6832 Body mass index (BMI) 32.0-32.9, adult: Secondary | ICD-10-CM | POA: Diagnosis not present

## 2017-12-29 DIAGNOSIS — L298 Other pruritus: Secondary | ICD-10-CM | POA: Diagnosis not present

## 2017-12-29 DIAGNOSIS — M109 Gout, unspecified: Secondary | ICD-10-CM | POA: Diagnosis not present

## 2017-12-29 DIAGNOSIS — Z1212 Encounter for screening for malignant neoplasm of rectum: Secondary | ICD-10-CM | POA: Diagnosis not present

## 2017-12-29 DIAGNOSIS — E1149 Type 2 diabetes mellitus with other diabetic neurological complication: Secondary | ICD-10-CM | POA: Diagnosis not present

## 2017-12-29 DIAGNOSIS — E7849 Other hyperlipidemia: Secondary | ICD-10-CM | POA: Diagnosis not present

## 2017-12-29 DIAGNOSIS — I10 Essential (primary) hypertension: Secondary | ICD-10-CM | POA: Diagnosis not present

## 2018-01-23 DIAGNOSIS — M14679 Charcot's joint, unspecified ankle and foot: Secondary | ICD-10-CM | POA: Insufficient documentation

## 2018-01-23 DIAGNOSIS — M14671 Charcot's joint, right ankle and foot: Secondary | ICD-10-CM | POA: Diagnosis not present

## 2018-01-23 DIAGNOSIS — M722 Plantar fascial fibromatosis: Secondary | ICD-10-CM | POA: Diagnosis not present

## 2018-02-10 DIAGNOSIS — R972 Elevated prostate specific antigen [PSA]: Secondary | ICD-10-CM | POA: Diagnosis not present

## 2018-02-10 DIAGNOSIS — N281 Cyst of kidney, acquired: Secondary | ICD-10-CM | POA: Diagnosis not present

## 2018-02-10 DIAGNOSIS — N5201 Erectile dysfunction due to arterial insufficiency: Secondary | ICD-10-CM | POA: Diagnosis not present

## 2018-02-10 DIAGNOSIS — R3915 Urgency of urination: Secondary | ICD-10-CM | POA: Diagnosis not present

## 2018-03-18 ENCOUNTER — Encounter: Payer: Self-pay | Admitting: Podiatry

## 2018-03-18 ENCOUNTER — Ambulatory Visit (INDEPENDENT_AMBULATORY_CARE_PROVIDER_SITE_OTHER): Payer: Medicare Other | Admitting: Podiatry

## 2018-03-18 DIAGNOSIS — L600 Ingrowing nail: Secondary | ICD-10-CM

## 2018-03-18 DIAGNOSIS — B351 Tinea unguium: Secondary | ICD-10-CM | POA: Diagnosis not present

## 2018-03-18 DIAGNOSIS — M79674 Pain in right toe(s): Secondary | ICD-10-CM

## 2018-03-18 DIAGNOSIS — D689 Coagulation defect, unspecified: Secondary | ICD-10-CM

## 2018-03-18 DIAGNOSIS — M79675 Pain in left toe(s): Secondary | ICD-10-CM

## 2018-03-18 DIAGNOSIS — W450XXA Nail entering through skin, initial encounter: Secondary | ICD-10-CM

## 2018-03-18 MED ORDER — DOXYCYCLINE HYCLATE 100 MG PO TABS: 100.0000 mg | ORAL_TABLET | Freq: Two times a day (BID) | ORAL | 0 refills | Status: DC

## 2018-03-18 NOTE — Patient Instructions (Signed)

## 2018-03-18 NOTE — Progress Notes (Signed)
Patient ID: Raymond Herring, male   DOB: 1950/05/19, 68 y.o.   MRN: [id number] Complaint:  Visit Type: Patient returns to my office for continued preventative foot care services. Complaint: Patient states" my nails have grown long and thick and become painful to walk and wear shoes" Patient has been diagnosed with DM with neuropathy.. The patient presents for preventative foot care services. No changes to ROS .  Patient is taking pletal. Patient has redness and swelling around left big toenail. Patient is neuropathic and was unaware of nail plate injury.  Podiatric Exam: Vascular: dorsalis pedis and posterior tibial pulses are palpable bilateral. Capillary return is immediate. Temperature gradient is WNL. Skin turgor WNL  Sensorium: Absent  Semmes Weinstein monofilament test. Absent tactile sensation bilaterally. Nail Exam: Pt has thick disfigured discolored nails with subungual debris noted bilateral entire nail hallux through fifth toenails.  Left hallux nail plate is completely loose from nail bed except at proximal nail . Ulcer Exam: There is no evidence of ulcer or pre-ulcerative changes or infection. Orthopedic Exam: Muscle tone and strength are WNL. No limitations in general ROM. No crepitus or effusions noted. Foot type and digits show no abnormalities. Bony prominences are unremarkable. Hot painful  swollen  medial aspect right ankle.  Muscle power WNL.    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 Skin: No Porokeratosis. No infection or ulcers  Diagnosis:  Onychomycosis, , Pain in right toe, pain in left toes Nail Injury left hallux.  Treatment & Plan Procedures and Treatment: Consent by patient was obtained for treatment procedures. The patient understood the discussion of treatment and procedures well. All questions were answered thoroughly reviewed. Debridement of mycotic and hypertrophic toenails, 1 through 5 bilateral and clearing of subungual  debris. Removal of nail plate left hallux.  Neosporin/DSD.  Home soaks.  RTC 3 months.  Prescribe doxycycline. Return Visit-Office Procedure: Patient instructed to return to the office for a follow up visit 3 months for continued evaluation and treatment.   Gardiner Barefoot DPM

## 2018-03-23 DIAGNOSIS — C61 Malignant neoplasm of prostate: Secondary | ICD-10-CM | POA: Diagnosis not present

## 2018-03-23 DIAGNOSIS — R972 Elevated prostate specific antigen [PSA]: Secondary | ICD-10-CM | POA: Diagnosis not present

## 2018-04-02 DIAGNOSIS — N5201 Erectile dysfunction due to arterial insufficiency: Secondary | ICD-10-CM | POA: Diagnosis not present

## 2018-04-02 DIAGNOSIS — N281 Cyst of kidney, acquired: Secondary | ICD-10-CM | POA: Diagnosis not present

## 2018-04-02 DIAGNOSIS — R3915 Urgency of urination: Secondary | ICD-10-CM | POA: Diagnosis not present

## 2018-04-02 DIAGNOSIS — C61 Malignant neoplasm of prostate: Secondary | ICD-10-CM | POA: Diagnosis not present

## 2018-04-07 ENCOUNTER — Other Ambulatory Visit: Payer: Self-pay | Admitting: Urology

## 2018-04-08 DIAGNOSIS — C61 Malignant neoplasm of prostate: Secondary | ICD-10-CM | POA: Diagnosis not present

## 2018-04-08 DIAGNOSIS — M6281 Muscle weakness (generalized): Secondary | ICD-10-CM | POA: Diagnosis not present

## 2018-04-13 DIAGNOSIS — M6281 Muscle weakness (generalized): Secondary | ICD-10-CM | POA: Diagnosis not present

## 2018-04-13 DIAGNOSIS — M62838 Other muscle spasm: Secondary | ICD-10-CM | POA: Diagnosis not present

## 2018-04-13 DIAGNOSIS — N393 Stress incontinence (female) (male): Secondary | ICD-10-CM | POA: Diagnosis not present

## 2018-04-21 DIAGNOSIS — C61 Malignant neoplasm of prostate: Secondary | ICD-10-CM | POA: Diagnosis not present

## 2018-04-21 DIAGNOSIS — Z1331 Encounter for screening for depression: Secondary | ICD-10-CM | POA: Diagnosis not present

## 2018-04-21 DIAGNOSIS — E1140 Type 2 diabetes mellitus with diabetic neuropathy, unspecified: Secondary | ICD-10-CM | POA: Diagnosis not present

## 2018-04-21 DIAGNOSIS — M109 Gout, unspecified: Secondary | ICD-10-CM | POA: Diagnosis not present

## 2018-04-21 DIAGNOSIS — M722 Plantar fascial fibromatosis: Secondary | ICD-10-CM | POA: Insufficient documentation

## 2018-04-21 DIAGNOSIS — I739 Peripheral vascular disease, unspecified: Secondary | ICD-10-CM | POA: Diagnosis not present

## 2018-04-21 DIAGNOSIS — E1149 Type 2 diabetes mellitus with other diabetic neurological complication: Secondary | ICD-10-CM | POA: Diagnosis not present

## 2018-04-27 DIAGNOSIS — M62838 Other muscle spasm: Secondary | ICD-10-CM | POA: Diagnosis not present

## 2018-04-27 DIAGNOSIS — M6281 Muscle weakness (generalized): Secondary | ICD-10-CM | POA: Diagnosis not present

## 2018-04-27 DIAGNOSIS — N393 Stress incontinence (female) (male): Secondary | ICD-10-CM | POA: Diagnosis not present

## 2018-05-26 DIAGNOSIS — H25013 Cortical age-related cataract, bilateral: Secondary | ICD-10-CM | POA: Diagnosis not present

## 2018-05-26 DIAGNOSIS — E119 Type 2 diabetes mellitus without complications: Secondary | ICD-10-CM | POA: Diagnosis not present

## 2018-05-26 DIAGNOSIS — H2513 Age-related nuclear cataract, bilateral: Secondary | ICD-10-CM | POA: Diagnosis not present

## 2018-05-26 DIAGNOSIS — H40013 Open angle with borderline findings, low risk, bilateral: Secondary | ICD-10-CM | POA: Diagnosis not present

## 2018-05-27 ENCOUNTER — Other Ambulatory Visit: Payer: Self-pay | Admitting: Urology

## 2018-06-01 DIAGNOSIS — N281 Cyst of kidney, acquired: Secondary | ICD-10-CM | POA: Diagnosis not present

## 2018-06-01 DIAGNOSIS — C61 Malignant neoplasm of prostate: Secondary | ICD-10-CM | POA: Diagnosis not present

## 2018-06-01 DIAGNOSIS — R3915 Urgency of urination: Secondary | ICD-10-CM | POA: Diagnosis not present

## 2018-06-01 DIAGNOSIS — N5201 Erectile dysfunction due to arterial insufficiency: Secondary | ICD-10-CM | POA: Diagnosis not present

## 2018-06-05 DIAGNOSIS — M79671 Pain in right foot: Secondary | ICD-10-CM | POA: Diagnosis not present

## 2018-06-05 DIAGNOSIS — M19072 Primary osteoarthritis, left ankle and foot: Secondary | ICD-10-CM | POA: Diagnosis not present

## 2018-06-05 DIAGNOSIS — M79672 Pain in left foot: Secondary | ICD-10-CM | POA: Diagnosis not present

## 2018-06-05 DIAGNOSIS — M14671 Charcot's joint, right ankle and foot: Secondary | ICD-10-CM | POA: Diagnosis not present

## 2018-06-05 DIAGNOSIS — M19079 Primary osteoarthritis, unspecified ankle and foot: Secondary | ICD-10-CM | POA: Insufficient documentation

## 2018-06-09 ENCOUNTER — Other Ambulatory Visit (HOSPITAL_COMMUNITY)

## 2018-06-09 ENCOUNTER — Ambulatory Visit (INDEPENDENT_AMBULATORY_CARE_PROVIDER_SITE_OTHER): Payer: Medicare Other | Admitting: Podiatry

## 2018-06-09 ENCOUNTER — Other Ambulatory Visit: Payer: Self-pay

## 2018-06-09 ENCOUNTER — Encounter: Payer: Self-pay | Admitting: Podiatry

## 2018-06-09 VITALS — Temp 97.5°F

## 2018-06-09 DIAGNOSIS — B351 Tinea unguium: Secondary | ICD-10-CM

## 2018-06-09 DIAGNOSIS — E11610 Type 2 diabetes mellitus with diabetic neuropathic arthropathy: Secondary | ICD-10-CM

## 2018-06-09 DIAGNOSIS — D689 Coagulation defect, unspecified: Secondary | ICD-10-CM

## 2018-06-09 DIAGNOSIS — M79674 Pain in right toe(s): Secondary | ICD-10-CM

## 2018-06-09 DIAGNOSIS — M79675 Pain in left toe(s): Secondary | ICD-10-CM | POA: Diagnosis not present

## 2018-06-09 NOTE — Progress Notes (Signed)
Patient ID: Raymond Herring, male   DOB: Dec 01, 1950, 68 y.o.   MRN: [id number] Complaint:  Visit Type: Patient returns to my office for continued preventative foot care services. Complaint: Patient states" my nails have grown long and thick and become painful to walk and wear shoes" Patient has been diagnosed with DM with neuropathy.. The patient presents for preventative foot care services. No changes to ROS .  Patient is taking pletal.  Podiatric Exam: Vascular: dorsalis pedis and posterior tibial pulses are palpable bilateral. Capillary return is immediate. Temperature gradient is WNL. Skin turgor WNL  Sensorium: Normal Semmes Weinstein monofilament test. Normal tactile sensation bilaterally. Nail Exam: Pt has thick disfigured discolored nails with subungual debris noted bilateral entire nail hallux through fifth toenails Ulcer Exam: There is no evidence of ulcer or pre-ulcerative changes or infection. Orthopedic Exam: Muscle tone and strength are WNL. No limitations in general ROM. No crepitus or effusions noted. Foot type and digits show no abnormalities. Bony prominences are unremarkable. Hot painful  swollen  medial aspect right ankle.  Muscle power WNL.    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 Skin: No Porokeratosis. No infection or ulcers  Diagnosis:  Onychomycosis, , Pain in right toe, pain in left toes,  Diabetes with neuropathy.  Treatment & Plan Procedures and Treatment: Consent by patient was obtained for treatment procedures. The patient understood the discussion of treatment and procedures well. All questions were answered thoroughly reviewed. Debridement of mycotic and hypertrophic toenails, 1 through 5 bilateral and clearing of subungual debris. No ulceration, no infection noted.  Return Visit-Office Procedure: Patient instructed to return to the office for a follow up visit 3 months for continued evaluation and treatment.   Gardiner Barefoot  DPM

## 2018-06-09 NOTE — Progress Notes (Signed)
SPOKE W/Pt _     SCREENING SYMPTOMS OF COVID 19:   COUGH--No  RUNNY NOSE--- No  SORE THROAT---No  NASAL CONGESTION----No  SNEEZING----No  SHORTNESS OF BREATH---No  DIFFICULTY BREATHING---No  TEMP >100.0 -----No  UNEXPLAINED BODY ACHES------No  CHILLS --------No   HEADACHES ---------No  LOSS OF SMELL/ TASTE --------No    HAVE YOU OR ANY FAMILY MEMBER TRAVELLED PAST 14 DAYS OUT OF THE   COUNTY---No STATE----No COUNTRY---No-  HAVE YOU OR ANY FAMILY MEMBER BEEN EXPOSED TO ANYONE WITH COVID 19? No    

## 2018-06-09 NOTE — Patient Instructions (Addendum)
Raymond Herring  06/09/2018   Your procedure is scheduled on: 06-17-18    Report to [facility] Main  Entrance    Report to Admitting at 6:30 AM   YOU NEED TO HAVE A COVID 19 TEST ON 06-12-18 , THIS TEST MUST BE DONE BEFORE SURGERY, COME TO [place] ENTRANCE BETWEEN THE HOURS OF 900 AM AND 300 PM ON YOUR COVID TEST DATE.   Call this number if you have problems the morning of surgery [phone]    Remember: Do not eat food or drink liquids :After Midnight.    BRUSH YOUR TEETH MORNING OF SURGERY AND RINSE YOUR MOUTH OUT, NO CHEWING GUM CANDY OR MINTS.     Take these medicines the morning of surgery with A SIP OF WATER: Colchicine, prn, Gabapentin (Neurontin), Pravastatin (Pravachol). You may also bring and use your eyedrops as needed.                                 You may not have any metal on your body including hair pins and              piercings     Do not wear jewelry, cologne, lotions, powders or deodorant                Men may shave face and neck.   Do not bring valuables to the hospital. [place].  Contacts, dentures or bridgework may not be worn into surgery.     Special Instructions: Please consume a Clear Liquid Diet the day of your prep, per your surgeon's instructions     CLEAR LIQUID DIET   Foods Allowed                                                                     Foods Excluded  Coffee and tea, regular and decaf                             liquids that you cannot  Plain Jell-O in any flavor                                             see through such as: Fruit ices (not with fruit pulp)                                     milk, soups, orange juice  Iced Popsicles                                    All solid food Carbonated beverages, regular and diet  Cranberry, grape and apple juices Sports drinks like  Gatorade Lightly seasoned clear broth or consume(fat free) Sugar, honey syrup  Sample Menu Breakfast                                Lunch                                     Supper Cranberry juice                    Beef broth                            Chicken broth Jell-O                                     Grape juice                           Apple juice Coffee or tea                        Jell-O                                      Popsicle                                                Coffee or tea                        Coffee or tea  _____________________________________________________________________                        [facility] - Preparing for Surgery Before surgery, you can play an important role.  Because skin is not sterile, your skin needs to be as free of germs as possible.  You can reduce the number of germs on your skin by washing with CHG (chlorahexidine gluconate) soap before surgery.  CHG is an antiseptic cleaner which kills germs and bonds with the skin to continue killing germs even after washing. Please DO NOT use if you have an allergy to CHG or antibacterial soaps.  If your skin becomes reddened/irritated stop using the CHG and inform your nurse when you arrive at Short Stay. Do not shave (including legs and underarms) for at least 48 hours prior to the first CHG shower.  You may shave your face/neck. Please follow these instructions carefully:  1.  Shower with CHG Soap the night before surgery and the  morning of Surgery.  2.  If you choose to wash your hair, wash your hair first as usual with your  normal  shampoo.  3.  After you shampoo, rinse your hair and body thoroughly to remove the  shampoo.                           4.  Use CHG as  you would any other liquid soap.  You can apply chg directly  to the skin and wash                       Gently with a scrungie or clean washcloth.  5.  Apply the CHG Soap to your body ONLY FROM THE NECK DOWN.   Do not use  on face/ open                           Wound or open sores. Avoid contact with eyes, ears mouth and genitals (private parts).                       Wash face,  Genitals (private parts) with your normal soap.             6.  Wash thoroughly, paying special attention to the area where your surgery  will be performed.  7.  Thoroughly rinse your body with warm water from the neck down.  8.  DO NOT shower/wash with your normal soap after using and rinsing off  the CHG Soap.                9.  Pat yourself dry with a clean towel.            10.  Wear clean pajamas.            11.  Place clean sheets on your bed the night of your first shower and do not  sleep with pets. Day of Surgery : Do not apply any lotions/deodorants the morning of surgery.  Please wear clean clothes to the hospital/surgery center.  FAILURE TO FOLLOW THESE INSTRUCTIONS MAY RESULT IN THE CANCELLATION OF YOUR SURGERY PATIENT SIGNATURE_________________________________  NURSE SIGNATURE__________________________________  ________________________________________________________________________

## 2018-06-10 ENCOUNTER — Encounter (HOSPITAL_COMMUNITY)
Admission: RE | Admit: 2018-06-10 | Discharge: 2018-06-10 | Disposition: A | Discharge status: Home / Self Care | Payer: Medicare Other | Source: Ambulatory Visit | Attending: Urology | Admitting: Urology

## 2018-06-10 ENCOUNTER — Other Ambulatory Visit: Payer: Self-pay

## 2018-06-10 ENCOUNTER — Encounter (HOSPITAL_COMMUNITY): Payer: Self-pay

## 2018-06-10 DIAGNOSIS — E118 Type 2 diabetes mellitus with unspecified complications: Secondary | ICD-10-CM | POA: Insufficient documentation

## 2018-06-10 DIAGNOSIS — Z01818 Encounter for other preprocedural examination: Secondary | ICD-10-CM | POA: Insufficient documentation

## 2018-06-10 DIAGNOSIS — C61 Malignant neoplasm of prostate: Secondary | ICD-10-CM | POA: Insufficient documentation

## 2018-06-10 DIAGNOSIS — I10 Essential (primary) hypertension: Secondary | ICD-10-CM | POA: Diagnosis not present

## 2018-06-10 LAB — BASIC METABOLIC PANEL
Anion gap: 4 — ABNORMAL LOW (ref 5–15)
BUN: 26 mg/dL — ABNORMAL HIGH (ref 8–23)
CO2: 30 mmol/L (ref 22–32)
Calcium: 9.7 mg/dL (ref 8.9–10.3)
Chloride: 106 mmol/L (ref 98–111)
Creatinine, Ser: 1.01 mg/dL (ref 0.61–1.24)
GFR calc Af Amer: >60 mL/min (ref >60)
GFR calc non Af Amer: >60 mL/min (ref >60)
Glucose, Bld: 157 mg/dL — ABNORMAL HIGH (ref 70–99)
Potassium: 4.6 mmol/L (ref 3.5–5.1)
Sodium: 140 mmol/L (ref 135–145)

## 2018-06-10 LAB — CBC
HCT: 38.7 % — ABNORMAL LOW (ref 39.0–52.0)
Hemoglobin: 13 g/dL (ref 13.0–17.0)
MCH: 33.2 pg (ref 26.0–34.0)
MCHC: 33.6 g/dL (ref 30.0–36.0)
MCV: 98.7 fL (ref 80.0–100.0)
Platelets: 172 10*3/uL (ref 150–400)
RBC: 3.92 MIL/uL — ABNORMAL LOW (ref 4.22–5.81)
RDW: 12.7 % (ref 11.5–15.5)
WBC: 5.5 10*3/uL (ref 4.0–10.5)
nRBC: 0 % (ref 0.0–0.2)

## 2018-06-10 LAB — HEMOGLOBIN A1C
Hgb A1c MFr Bld: 5.7 % — ABNORMAL HIGH (ref 4.8–5.6)
Mean Plasma Glucose: 116.89 mg/dL

## 2018-06-10 LAB — GLUCOSE, CAPILLARY: Glucose-Capillary: 175 mg/dL — ABNORMAL HIGH (ref 70–99)

## 2018-06-11 DIAGNOSIS — M7712 Lateral epicondylitis, left elbow: Secondary | ICD-10-CM | POA: Diagnosis not present

## 2018-06-11 DIAGNOSIS — M25519 Pain in unspecified shoulder: Secondary | ICD-10-CM | POA: Diagnosis not present

## 2018-06-11 DIAGNOSIS — M25512 Pain in left shoulder: Secondary | ICD-10-CM | POA: Diagnosis not present

## 2018-06-15 ENCOUNTER — Other Ambulatory Visit (HOSPITAL_COMMUNITY)
Admission: RE | Admit: 2018-06-15 | Discharge: 2018-06-15 | Disposition: A | Discharge status: Home / Self Care | Payer: Medicare Other | Source: Ambulatory Visit | Attending: Urology | Admitting: Urology

## 2018-06-15 DIAGNOSIS — Z1159 Encounter for screening for other viral diseases: Secondary | ICD-10-CM | POA: Insufficient documentation

## 2018-06-15 LAB — SARS CORONAVIRUS 2 BY RT PCR (HOSPITAL ORDER, PERFORMED IN ~~LOC~~ HOSPITAL LAB): SARS Coronavirus 2: NEGATIVE

## 2018-06-16 NOTE — Progress Notes (Signed)
    SPOKE W/  _ PT Raymond Herring      SCREENING SYMPTOMS OF COVID 19:   COUGH-- NO   RUNNY NOSE--- no  SORE THROAT---no  NASAL CONGESTION----no  SNEEZING----no  SHORTNESS OF BREATH---no DIFFICULTY BREATHING---no  TEMP >100.0 ----- no  UNEXPLAINED BODY ACHES------ no  CHILLS -------- no  HEADACHES --------- no  LOSS OF SMELL/ TASTE --------no   HAVE YOU OR ANY FAMILY MEMBER TRAVELLED PAST 14 DAYS OUT OF THE   COUNTY--- no STATE----no COUNTRY----no  HAVE YOU OR ANY FAMILY MEMBER BEEN EXPOSED TO ANYONE WITH COVID 19? no

## 2018-06-16 NOTE — Anesthesia Preprocedure Evaluation (Addendum)
Anesthesia Evaluation  Patient identified by MRN, date of birth, ID band Patient awake    Reviewed: Allergy & Precautions, NPO status , Patient's Chart, lab work & pertinent test results  Airway Mallampati: I  TM Distance: >3 FB Neck ROM: Full    Dental no notable dental hx. (+) Teeth Intact, Dental Advisory Given   Pulmonary neg pulmonary ROS, former smoker,    Pulmonary exam normal breath sounds clear to auscultation       Cardiovascular hypertension, negative cardio ROS Normal cardiovascular exam Rhythm:Regular Rate:Normal     Neuro/Psych  Headaches, PSYCHIATRIC DISORDERS Anxiety    GI/Hepatic negative GI ROS, (+)     substance abuse  alcohol use,   Endo/Other  negative endocrine ROSdiabetes, Well Controlled, Type 2  Renal/GU negative Renal ROS  negative genitourinary   Musculoskeletal negative musculoskeletal ROS (+)   Abdominal   Peds  Hematology negative hematology ROS (+)   Anesthesia Other Findings   Reproductive/Obstetrics                            Anesthesia Physical Anesthesia Plan  ASA: III  Anesthesia Plan: General   Post-op Pain Management:    Induction: Intravenous  PONV Risk Score and Plan: 1 and Ondansetron, Dexamethasone and Midazolam  Airway Management Planned: Oral ETT  Additional Equipment:   Intra-op Plan:   Post-operative Plan: Extubation in OR  Informed Consent: I have reviewed the patients History and Physical, chart, labs and discussed the procedure including the risks, benefits and alternatives for the proposed anesthesia with the patient or authorized representative who has indicated his/her understanding and acceptance.     Dental advisory given  Plan Discussed with: CRNA  Anesthesia Plan Comments:         Anesthesia Quick Evaluation

## 2018-06-17 ENCOUNTER — Encounter (HOSPITAL_COMMUNITY)
Admission: RE | Disposition: A | Discharge status: Home / Self Care | Payer: Self-pay | Source: Other Acute Inpatient Hospital | Attending: Urology

## 2018-06-17 ENCOUNTER — Other Ambulatory Visit: Payer: Self-pay

## 2018-06-17 ENCOUNTER — Ambulatory Visit: Payer: Medicare Other | Admitting: Podiatry

## 2018-06-17 ENCOUNTER — Ambulatory Visit (HOSPITAL_COMMUNITY): Payer: Medicare Other | Admitting: Anesthesiology

## 2018-06-17 ENCOUNTER — Ambulatory Visit (HOSPITAL_COMMUNITY): Payer: Medicare Other | Admitting: Physician Assistant

## 2018-06-17 ENCOUNTER — Encounter (HOSPITAL_COMMUNITY): Payer: Self-pay | Admitting: Anesthesiology

## 2018-06-17 ENCOUNTER — Observation Stay (HOSPITAL_COMMUNITY)
Admission: RE | Admit: 2018-06-17 | Discharge: 2018-06-18 | Disposition: A | Discharge status: Home / Self Care | Payer: Medicare Other | Source: Other Acute Inpatient Hospital | Attending: Urology | Admitting: Urology

## 2018-06-17 DIAGNOSIS — M1711 Unilateral primary osteoarthritis, right knee: Secondary | ICD-10-CM | POA: Diagnosis not present

## 2018-06-17 DIAGNOSIS — Z96653 Presence of artificial knee joint, bilateral: Secondary | ICD-10-CM | POA: Diagnosis not present

## 2018-06-17 DIAGNOSIS — E1140 Type 2 diabetes mellitus with diabetic neuropathy, unspecified: Secondary | ICD-10-CM | POA: Diagnosis not present

## 2018-06-17 DIAGNOSIS — C61 Malignant neoplasm of prostate: Principal | ICD-10-CM | POA: Diagnosis present

## 2018-06-17 DIAGNOSIS — Z79899 Other long term (current) drug therapy: Secondary | ICD-10-CM | POA: Insufficient documentation

## 2018-06-17 DIAGNOSIS — F419 Anxiety disorder, unspecified: Secondary | ICD-10-CM | POA: Diagnosis not present

## 2018-06-17 DIAGNOSIS — E785 Hyperlipidemia, unspecified: Secondary | ICD-10-CM | POA: Insufficient documentation

## 2018-06-17 DIAGNOSIS — I10 Essential (primary) hypertension: Secondary | ICD-10-CM | POA: Insufficient documentation

## 2018-06-17 DIAGNOSIS — Z87891 Personal history of nicotine dependence: Secondary | ICD-10-CM | POA: Diagnosis not present

## 2018-06-17 DIAGNOSIS — Z791 Long term (current) use of non-steroidal anti-inflammatories (NSAID): Secondary | ICD-10-CM | POA: Insufficient documentation

## 2018-06-17 DIAGNOSIS — M1990 Unspecified osteoarthritis, unspecified site: Secondary | ICD-10-CM | POA: Insufficient documentation

## 2018-06-17 DIAGNOSIS — M109 Gout, unspecified: Secondary | ICD-10-CM | POA: Insufficient documentation

## 2018-06-17 HISTORY — PX: ROBOT ASSISTED LAPAROSCOPIC RADICAL PROSTATECTOMY: SHX5141

## 2018-06-17 HISTORY — PX: LYMPHADENECTOMY: SHX5960

## 2018-06-17 LAB — GLUCOSE, CAPILLARY
Glucose-Capillary: 131 mg/dL — ABNORMAL HIGH (ref 70–99)
Glucose-Capillary: 163 mg/dL — ABNORMAL HIGH (ref 70–99)
Glucose-Capillary: 208 mg/dL — ABNORMAL HIGH (ref 70–99)
Glucose-Capillary: 173 mg/dL — ABNORMAL HIGH (ref 70–99)

## 2018-06-17 LAB — HEMOGLOBIN AND HEMATOCRIT, BLOOD
HCT: 36.8 % — ABNORMAL LOW (ref 39.0–52.0)
Hemoglobin: 12.6 g/dL — ABNORMAL LOW (ref 13.0–17.0)

## 2018-06-17 SURGERY — PROSTATECTOMY, RADICAL, ROBOT-ASSISTED, LAPAROSCOPIC
Anesthesia: General

## 2018-06-17 MED ORDER — ROCURONIUM BROMIDE 10 MG/ML (PF) SYRINGE
PREFILLED_SYRINGE | INTRAVENOUS | Status: AC
  Filled 2018-06-17: qty 10

## 2018-06-17 MED ORDER — ACETAMINOPHEN 500 MG PO TABS
1000.0000 mg | ORAL_TABLET | Freq: Once | ORAL | Status: AC
  Administered 2018-06-17: 1000 mg via ORAL
  Filled 2018-06-17: qty 2

## 2018-06-17 MED ORDER — PROPOFOL 10 MG/ML IV BOLUS
INTRAVENOUS | Status: DC | PRN
  Administered 2018-06-17: 200 mg via INTRAVENOUS

## 2018-06-17 MED ORDER — ONDANSETRON HCL 4 MG/2ML IJ SOLN
INTRAMUSCULAR | Status: DC | PRN
  Administered 2018-06-17: 4 mg via INTRAVENOUS

## 2018-06-17 MED ORDER — DEXAMETHASONE SODIUM PHOSPHATE 10 MG/ML IJ SOLN
INTRAMUSCULAR | Status: AC
  Filled 2018-06-17: qty 1

## 2018-06-17 MED ORDER — HYDROCODONE-ACETAMINOPHEN 5-325 MG PO TABS: 1.0000 | ORAL_TABLET | Freq: Four times a day (QID) | ORAL | 0 refills | Status: DC | PRN

## 2018-06-17 MED ORDER — DIPHENHYDRAMINE HCL 50 MG/ML IJ SOLN: 12.5000 mg | Freq: Four times a day (QID) | INTRAMUSCULAR | Status: DC | PRN

## 2018-06-17 MED ORDER — GABAPENTIN 300 MG PO CAPS: 300.0000 mg | ORAL_CAPSULE | ORAL | Status: DC

## 2018-06-17 MED ORDER — EPHEDRINE SULFATE-NACL 50-0.9 MG/10ML-% IV SOSY
PREFILLED_SYRINGE | INTRAVENOUS | Status: DC | PRN
  Administered 2018-06-17 (×5): 10 mg via INTRAVENOUS

## 2018-06-17 MED ORDER — EPHEDRINE 5 MG/ML INJ
INTRAVENOUS | Status: AC
  Filled 2018-06-17: qty 10

## 2018-06-17 MED ORDER — DEXTROSE-NACL 5-0.45 % IV SOLN
INTRAVENOUS | Status: DC
  Administered 2018-06-17: 15:00:00 via INTRAVENOUS

## 2018-06-17 MED ORDER — LACTATED RINGERS IV SOLN
INTRAVENOUS | Status: DC
  Administered 2018-06-17 (×3): via INTRAVENOUS

## 2018-06-17 MED ORDER — ALLOPURINOL 300 MG PO TABS
300.0000 mg | ORAL_TABLET | Freq: Every day | ORAL | Status: DC
  Administered 2018-06-17 – 2018-06-18 (×2): 300 mg via ORAL
  Filled 2018-06-17 (×2): qty 1

## 2018-06-17 MED ORDER — HYDROMORPHONE HCL 2 MG/ML IJ SOLN
INTRAMUSCULAR | Status: AC
  Filled 2018-06-17: qty 1

## 2018-06-17 MED ORDER — PRAVASTATIN SODIUM 20 MG PO TABS
20.0000 mg | ORAL_TABLET | Freq: Every day | ORAL | Status: DC
  Administered 2018-06-18: 20 mg via ORAL
  Filled 2018-06-17: qty 1

## 2018-06-17 MED ORDER — SULFAMETHOXAZOLE-TRIMETHOPRIM 800-160 MG PO TABS: 1.0000 | ORAL_TABLET | Freq: Two times a day (BID) | ORAL | 0 refills | Status: DC

## 2018-06-17 MED ORDER — STERILE WATER FOR IRRIGATION IR SOLN
Status: DC | PRN
  Administered 2018-06-17: 1000 mL

## 2018-06-17 MED ORDER — SUCCINYLCHOLINE CHLORIDE 200 MG/10ML IV SOSY
PREFILLED_SYRINGE | INTRAVENOUS | Status: AC
  Filled 2018-06-17: qty 10

## 2018-06-17 MED ORDER — SODIUM CHLORIDE 0.9 % IV BOLUS
1000.0000 mL | Freq: Once | INTRAVENOUS | Status: AC
  Administered 2018-06-17: 1000 mL via INTRAVENOUS

## 2018-06-17 MED ORDER — BELLADONNA ALKALOIDS-OPIUM 16.2-60 MG RE SUPP
1.0000 | Freq: Four times a day (QID) | RECTAL | Status: DC | PRN
  Administered 2018-06-17: 1 via RECTAL
  Filled 2018-06-17: qty 1

## 2018-06-17 MED ORDER — SODIUM CHLORIDE 0.45 % IV SOLN
INTRAVENOUS | Status: DC
  Administered 2018-06-17 – 2018-06-18 (×2): via INTRAVENOUS

## 2018-06-17 MED ORDER — HYDROMORPHONE HCL 1 MG/ML IJ SOLN
INTRAMUSCULAR | Status: DC | PRN
  Administered 2018-06-17 (×4): 0.5 mg via INTRAVENOUS

## 2018-06-17 MED ORDER — SUCCINYLCHOLINE CHLORIDE 200 MG/10ML IV SOSY
PREFILLED_SYRINGE | INTRAVENOUS | Status: DC | PRN
  Administered 2018-06-17: 180 mg via INTRAVENOUS

## 2018-06-17 MED ORDER — MAGNESIUM CITRATE PO SOLN
1.0000 | Freq: Once | ORAL | Status: DC
  Filled 2018-06-17: qty 296

## 2018-06-17 MED ORDER — BACITRACIN-NEOMYCIN-POLYMYXIN 400-5-5000 EX OINT: 1.0000 "application " | TOPICAL_OINTMENT | Freq: Three times a day (TID) | CUTANEOUS | Status: DC | PRN

## 2018-06-17 MED ORDER — ROCURONIUM BROMIDE 10 MG/ML (PF) SYRINGE
PREFILLED_SYRINGE | INTRAVENOUS | Status: DC | PRN
  Administered 2018-06-17: 20 mg via INTRAVENOUS
  Administered 2018-06-17: 30 mg via INTRAVENOUS
  Administered 2018-06-17: 10 mg via INTRAVENOUS
  Administered 2018-06-17: 20 mg via INTRAVENOUS
  Administered 2018-06-17: 70 mg via INTRAVENOUS

## 2018-06-17 MED ORDER — ONDANSETRON HCL 4 MG/2ML IJ SOLN
INTRAMUSCULAR | Status: AC
  Filled 2018-06-17: qty 2

## 2018-06-17 MED ORDER — OXYCODONE HCL 5 MG PO TABS: 5.0000 mg | ORAL_TABLET | ORAL | Status: DC | PRN

## 2018-06-17 MED ORDER — BUPIVACAINE LIPOSOME 1.3 % IJ SUSP
20.0000 mL | Freq: Once | INTRAMUSCULAR | Status: AC
  Administered 2018-06-17: 20 mL
  Filled 2018-06-17: qty 20

## 2018-06-17 MED ORDER — LIDOCAINE 2% (20 MG/ML) 5 ML SYRINGE
INTRAMUSCULAR | Status: AC
  Filled 2018-06-17: qty 5

## 2018-06-17 MED ORDER — SODIUM CHLORIDE (PF) 0.9 % IJ SOLN
INTRAMUSCULAR | Status: AC
  Filled 2018-06-17: qty 10

## 2018-06-17 MED ORDER — LACTATED RINGERS IR SOLN
Status: DC | PRN
  Administered 2018-06-17: 1

## 2018-06-17 MED ORDER — CEFAZOLIN SODIUM-DEXTROSE 2-4 GM/100ML-% IV SOLN
2.0000 g | INTRAVENOUS | Status: AC
  Administered 2018-06-17: 2 g via INTRAVENOUS
  Filled 2018-06-17: qty 100

## 2018-06-17 MED ORDER — SUGAMMADEX SODIUM 200 MG/2ML IV SOLN
INTRAVENOUS | Status: DC | PRN
  Administered 2018-06-17: 250 mg via INTRAVENOUS

## 2018-06-17 MED ORDER — ONDANSETRON HCL 4 MG/2ML IJ SOLN: 4.0000 mg | INTRAMUSCULAR | Status: DC | PRN

## 2018-06-17 MED ORDER — DOCUSATE SODIUM 100 MG PO CAPS
100.0000 mg | ORAL_CAPSULE | Freq: Two times a day (BID) | ORAL | Status: DC
  Administered 2018-06-17 – 2018-06-18 (×2): 100 mg via ORAL
  Filled 2018-06-17 (×2): qty 1

## 2018-06-17 MED ORDER — GABAPENTIN 300 MG PO CAPS
300.0000 mg | ORAL_CAPSULE | Freq: Every day | ORAL | Status: DC
  Administered 2018-06-18: 300 mg via ORAL
  Filled 2018-06-17: qty 1

## 2018-06-17 MED ORDER — MIDAZOLAM HCL 2 MG/2ML IJ SOLN
INTRAMUSCULAR | Status: AC
  Filled 2018-06-17: qty 2

## 2018-06-17 MED ORDER — SUGAMMADEX SODIUM 500 MG/5ML IV SOLN
INTRAVENOUS | Status: AC
  Filled 2018-06-17: qty 5

## 2018-06-17 MED ORDER — GABAPENTIN 300 MG PO CAPS
600.0000 mg | ORAL_CAPSULE | Freq: Every day | ORAL | Status: DC
  Administered 2018-06-17: 600 mg via ORAL
  Filled 2018-06-17 (×2): qty 2

## 2018-06-17 MED ORDER — DIPHENHYDRAMINE HCL 12.5 MG/5ML PO ELIX: 12.5000 mg | ORAL_SOLUTION | Freq: Four times a day (QID) | ORAL | Status: DC | PRN

## 2018-06-17 MED ORDER — SODIUM CHLORIDE (PF) 0.9 % IJ SOLN
INTRAMUSCULAR | Status: DC | PRN
  Administered 2018-06-17: 20 mL

## 2018-06-17 MED ORDER — FENTANYL CITRATE (PF) 100 MCG/2ML IJ SOLN: 25.0000 ug | INTRAMUSCULAR | Status: DC | PRN

## 2018-06-17 MED ORDER — DEXAMETHASONE SODIUM PHOSPHATE 10 MG/ML IJ SOLN
INTRAMUSCULAR | Status: DC | PRN
  Administered 2018-06-17: 10 mg via INTRAVENOUS

## 2018-06-17 MED ORDER — PROPOFOL 10 MG/ML IV BOLUS
INTRAVENOUS | Status: AC
  Filled 2018-06-17: qty 20

## 2018-06-17 MED ORDER — SUFENTANIL CITRATE 50 MCG/ML IV SOLN
INTRAVENOUS | Status: DC | PRN
  Administered 2018-06-17: 20 ug via INTRAVENOUS
  Administered 2018-06-17 (×3): 10 ug via INTRAVENOUS

## 2018-06-17 MED ORDER — ACETAMINOPHEN 500 MG PO TABS
1000.0000 mg | ORAL_TABLET | Freq: Four times a day (QID) | ORAL | Status: AC
  Administered 2018-06-17 – 2018-06-18 (×4): 1000 mg via ORAL
  Filled 2018-06-17 (×4): qty 2

## 2018-06-17 MED ORDER — LIDOCAINE 2% (20 MG/ML) 5 ML SYRINGE
INTRAMUSCULAR | Status: DC | PRN
  Administered 2018-06-17: 100 mg via INTRAVENOUS

## 2018-06-17 MED ORDER — LOSARTAN POTASSIUM 50 MG PO TABS
100.0000 mg | ORAL_TABLET | Freq: Every day | ORAL | Status: DC
  Administered 2018-06-18: 100 mg via ORAL
  Filled 2018-06-17: qty 2

## 2018-06-17 MED ORDER — MIDAZOLAM HCL 2 MG/2ML IJ SOLN
INTRAMUSCULAR | Status: DC | PRN
  Administered 2018-06-17: 2 mg via INTRAVENOUS

## 2018-06-17 MED ORDER — INSULIN ASPART 100 UNIT/ML ~~LOC~~ SOLN
0.0000 [IU] | Freq: Three times a day (TID) | SUBCUTANEOUS | Status: DC
  Administered 2018-06-17: 5 [IU] via SUBCUTANEOUS

## 2018-06-17 MED ORDER — SUFENTANIL CITRATE 50 MCG/ML IV SOLN
INTRAVENOUS | Status: AC
  Filled 2018-06-17: qty 1

## 2018-06-17 MED ORDER — SODIUM CHLORIDE (PF) 0.9 % IJ SOLN
INTRAMUSCULAR | Status: AC
  Filled 2018-06-17: qty 20

## 2018-06-17 SURGICAL SUPPLY — 66 items
APPLICATOR COTTON TIP 6 STRL (MISCELLANEOUS) ×2 IMPLANT
APPLICATOR COTTON TIP 6IN STRL (MISCELLANEOUS) ×3
CATH FOLEY 2WAY SLVR 18FR 30CC (CATHETERS) ×3 IMPLANT
CATH TIEMANN FOLEY 18FR 5CC (CATHETERS) ×3 IMPLANT
CHLORAPREP W/TINT 26 (MISCELLANEOUS) ×3 IMPLANT
CLIP VESOLOCK LG 6/CT PURPLE (CLIP) ×6 IMPLANT
CONT SPEC 4OZ CLIKSEAL STRL BL (MISCELLANEOUS) ×3 IMPLANT
COVER SURGICAL LIGHT HANDLE (MISCELLANEOUS) ×3 IMPLANT
COVER TIP SHEARS 8 DVNC (MISCELLANEOUS) ×2 IMPLANT
COVER TIP SHEARS 8MM DA VINCI (MISCELLANEOUS) ×1
COVER WAND RF STERILE (DRAPES) IMPLANT
CUTTER ECHEON FLEX ENDO 45 340 (ENDOMECHANICALS) ×3 IMPLANT
DECANTER SPIKE VIAL GLASS SM (MISCELLANEOUS) ×3 IMPLANT
DERMABOND ADVANCED (GAUZE/BANDAGES/DRESSINGS) ×1
DERMABOND ADVANCED .7 DNX12 (GAUZE/BANDAGES/DRESSINGS) ×2 IMPLANT
DRAIN CHANNEL RND F F (WOUND CARE) IMPLANT
DRAPE ARM DVNC X/XI (DISPOSABLE) ×8 IMPLANT
DRAPE COLUMN DVNC XI (DISPOSABLE) ×2 IMPLANT
DRAPE DA VINCI XI ARM (DISPOSABLE) ×4
DRAPE DA VINCI XI COLUMN (DISPOSABLE) ×1
DRAPE SURG IRRIG POUCH 19X23 (DRAPES) ×3 IMPLANT
DRSG TEGADERM 4X4.75 (GAUZE/BANDAGES/DRESSINGS) ×6 IMPLANT
DRSG TEGADERM 6X8 (GAUZE/BANDAGES/DRESSINGS) ×3 IMPLANT
ELECT PENCIL ROCKER SW 15FT (MISCELLANEOUS) ×3 IMPLANT
ELECT REM PT RETURN 15FT ADLT (MISCELLANEOUS) ×3 IMPLANT
GAUZE 4X4 16PLY RFD (DISPOSABLE) IMPLANT
GAUZE SPONGE 2X2 8PLY STRL LF (GAUZE/BANDAGES/DRESSINGS) ×2 IMPLANT
GLOVE BIO SURGEON STRL SZ 6.5 (GLOVE) ×3 IMPLANT
GLOVE BIOGEL M STRL SZ7.5 (GLOVE) ×6 IMPLANT
GLOVE BIOGEL PI IND STRL 7.5 (GLOVE) ×2 IMPLANT
GLOVE BIOGEL PI INDICATOR 7.5 (GLOVE) ×1
GOWN STRL REUS W/TWL LRG LVL3 (GOWN DISPOSABLE) ×9 IMPLANT
HOLDER FOLEY CATH W/STRAP (MISCELLANEOUS) ×3 IMPLANT
IRRIG SUCT STRYKERFLOW 2 WTIP (MISCELLANEOUS) ×3
IRRIGATION SUCT STRKRFLW 2 WTP (MISCELLANEOUS) ×2 IMPLANT
IV LACTATED RINGERS 1000ML (IV SOLUTION) ×3 IMPLANT
KIT PROCEDURE DA VINCI SI (MISCELLANEOUS) ×1
KIT PROCEDURE DVNC SI (MISCELLANEOUS) ×2 IMPLANT
KIT TURNOVER KIT A (KITS) IMPLANT
NEEDLE INSUFFLATION 14GA 120MM (NEEDLE) ×3 IMPLANT
NEEDLE SPNL 22GX7 QUINCKE BK (NEEDLE) ×3 IMPLANT
PACK ROBOT UROLOGY CUSTOM (CUSTOM PROCEDURE TRAY) ×3 IMPLANT
PAD POSITIONING PINK XL (MISCELLANEOUS) ×3 IMPLANT
PORT ACCESS TROCAR AIRSEAL 12 (TROCAR) ×2 IMPLANT
PORT ACCESS TROCAR AIRSEAL 5M (TROCAR) ×1
SEAL CANN UNIV 5-8 DVNC XI (MISCELLANEOUS) ×8 IMPLANT
SEAL XI 5MM-8MM UNIVERSAL (MISCELLANEOUS) ×4
SET TRI-LUMEN FLTR TB AIRSEAL (TUBING) ×3 IMPLANT
SOLUTION ELECTROLUBE (MISCELLANEOUS) ×3 IMPLANT
SPONGE GAUZE 2X2 STER 10/PKG (GAUZE/BANDAGES/DRESSINGS) ×1
SPONGE LAP 4X18 RFD (DISPOSABLE) ×3 IMPLANT
STAPLE RELOAD 45 GRN (STAPLE) ×4 IMPLANT
STAPLE RELOAD 45MM GREEN (STAPLE) ×2
SUT ETHILON 3 0 PS 1 (SUTURE) ×3 IMPLANT
SUT MNCRL AB 4-0 PS2 18 (SUTURE) ×6 IMPLANT
SUT PDS AB 1 CT1 27 (SUTURE) ×6 IMPLANT
SUT VIC AB 2-0 SH 27 (SUTURE) ×1
SUT VIC AB 2-0 SH 27X BRD (SUTURE) ×2 IMPLANT
SUT VIC AB 3-0 SH 27 (SUTURE) ×1
SUT VIC AB 3-0 SH 27XBRD (SUTURE) ×2 IMPLANT
SUT VICRYL 0 UR6 27IN ABS (SUTURE) ×3 IMPLANT
SUT VLOC BARB 180 ABS3/0GR12 (SUTURE) ×9
SUTURE VLOC BRB 180 ABS3/0GR12 (SUTURE) ×6 IMPLANT
SYR 27GX1/2 1ML LL SAFETY (SYRINGE) ×3 IMPLANT
TOWEL OR NON WOVEN STRL DISP B (DISPOSABLE) ×3 IMPLANT
WATER STERILE IRR 1000ML POUR (IV SOLUTION) ×3 IMPLANT

## 2018-06-17 NOTE — Progress Notes (Signed)
Pt up ambulated in the hall, states pain 3/10, c/o increase pressure, B & O suppository administered earlier. Foley patent and JP charged. Does not want to take pain med at present time.  SRP, RN

## 2018-06-17 NOTE — Anesthesia Postprocedure Evaluation (Signed)
Anesthesia Post Note  Patient: Isham Smitherman Hinesley  Procedure(s) Performed: XI ROBOTIC ASSISTED LAPAROSCOPIC RADICAL PROSTATECTOMY (N/A ) LYMPHADENECTOMY (Bilateral )     Patient location during evaluation: PACU Anesthesia Type: General Level of consciousness: awake and alert Pain management: pain level controlled Vital Signs Assessment: post-procedure vital signs reviewed and stable Respiratory status: spontaneous breathing, nonlabored ventilation, respiratory function stable and patient connected to nasal cannula oxygen Cardiovascular status: blood pressure returned to baseline and stable Postop Assessment: no apparent nausea or vomiting Anesthetic complications: no    Last Vitals:  Vitals:   06/17/18 1345 06/17/18 1411  BP: 133/82 (!) 154/90  Pulse: 84 89  Resp: 19 17  Temp:  36.7 C  SpO2: 99% 96%    Last Pain:  Vitals:   06/17/18 1415  TempSrc:   PainSc: 0-No pain                 Chelsey L Woodrum

## 2018-06-17 NOTE — H&P (Signed)
Raymond Herring is an 68 y.o. male.    Chief Complaint: Pre-OP Prostatectomy  HPI:   1 - Moderate Risk Prostate Cancer -5/12 cores Grade 2 cancer up to 70% of core on eval PSA 4.8 2020. Rt lateral base and Lt apical areas. TRUS "76 mL" No median lobe. Pt's father with prostate cancer at late age. DRE 60gm with Rt base induration.   PMH sig for obesity, DM2 (diet controlled), HLD, bilateral TKR, lumbago. His PCP is Raymond Battles MD with [facility].   Today " Raymond Herring " is seen to proceed with curative intent prostatectomy.    Past Medical History:  Diagnosis Date  . Anxiety   . Arthritis   . Diabetes mellitus   . Diverticulosis   . Headache   . Hyperlipidemia   . Hypertension   . Neuropathy    feet and fingers    Past Surgical History:  Procedure Laterality Date  . APPENDECTOMY    . KNEE CARTILAGE SURGERY Bilateral [id number]   right and left knee  . LAPAROSCOPIC APPENDECTOMY  2014  . TOTAL KNEE ARTHROPLASTY  01/30/2011   Procedure: TOTAL KNEE ARTHROPLASTY;  Surgeon: Tobi Bastos, MD;  Location: [facility] ORS;  Service: Orthopedics;  Laterality: Right;  . TOTAL KNEE ARTHROPLASTY Left 01/31/2016   Procedure: LEFT TOTAL KNEE ARTHROPLASTY;  Surgeon: Latanya Maudlin, MD;  Location: [facility] ORS;  Service: Orthopedics;  Laterality: Left;    Family History  Problem Relation Age of Onset  . Heart disease Father        Pacemaker  . CVA Father   . Alzheimer's disease Father   . Colon cancer Neg Hx    Social History:  reports that he quit smoking about 37 years ago. His smoking use included cigarettes. He has a 50.00 pack-year smoking history. He has never used smokeless tobacco. He reports current alcohol use of about 35.0 standard drinks of alcohol per week. He reports that he does not use drugs.  Allergies: No Known Allergies  Medications Prior to Admission  Medication Sig Dispense Refill  . acetaminophen (TYLENOL) 500 MG tablet Take 1,000 mg by mouth 2 (two) times a day.    .  allopurinol (ZYLOPRIM) 300 MG tablet Take 300 mg by mouth daily.  12  . Ascorbic Acid (VITAMIN C PO) Take 3,000 mg by mouth daily.    . celecoxib (CELEBREX) 200 MG capsule Take 200 mg by mouth daily.     . cephALEXin (KEFLEX) 500 MG capsule Take 2,000 mg by mouth See admin instructions. Take 2000 mg 1 hour prior to dental work    . Cholecalciferol (VITAMIN D3) 125 MCG (5000 UT) CAPS Take 5,000 Units by mouth every evening.    . cilostazol (PLETAL) 100 MG tablet Take 100 mg by mouth every evening.     . clobetasol (OLUX) 0.05 % topical foam Apply 1 application topically once a week.    . Clobetasol Propionate 0.05 % shampoo Apply 1 application topically once a week.     . colchicine (COLCRYS) 0.6 MG tablet Take 0.6 mg by mouth daily as needed (for gout).    . Cyanocobalamin (B-12) 3000 MCG CAPS Take 3,000 mcg by mouth every evening.    . gabapentin (NEURONTIN) 300 MG capsule Take 300-600 mg by mouth See admin instructions. Take 300 mg in the morning and 600 mg at night    . Hypromellose 0.3 % SOLN Place 1-2 drops into both eyes 3 (three) times daily as needed (for dry eyes). RETAINE  [facility] 0.3%    . loperamide (IMODIUM A-D) 2 MG tablet Take 2-4 mg by mouth 4 (four) times daily as needed for diarrhea or loose stools.     Marland Kitchen losartan (COZAAR) 100 MG tablet Take 100 mg by mouth daily.    . Multiple Vitamin (MULTIVITAMIN WITH MINERALS) TABS tablet Take 1 tablet by mouth daily. Centrum Silver    . pravastatin (PRAVACHOL) 20 MG tablet Take 20 mg by mouth daily.    Marland Kitchen saccharomyces boulardii (FLORASTOR) 250 MG capsule Take 250 mg by mouth 2 (two) times daily.    . tamsulosin (FLOMAX) 0.4 MG CAPS capsule Take 0.4 mg by mouth every evening.     . Wheat Dextrin (BENEFIBER DRINK MIX PO) Take 1 Dose by mouth daily.     Marland Kitchen zolpidem (AMBIEN) 10 MG tablet Take 10 mg by mouth at bedtime as needed for sleep.     Marland Kitchen doxycycline (VIBRA-TABS) 100 MG tablet Take 1 tablet (100 mg total) by mouth 2 (two) times daily. (Patient  not taking: Reported on 06/03/2018) 20 tablet 0  . ONE TOUCH ULTRA TEST test strip     . sulfamethoxazole-trimethoprim (BACTRIM DS) 800-160 MG tablet sulfamethoxazole 800 mg-trimethoprim 160 mg tablet      Results for orders placed or performed during the hospital encounter of 06/17/18 (from the past 48 hour(s))  Glucose, capillary     Status: Abnormal   Collection Time: 06/17/18  6:28 AM  Result Value Ref Range   Glucose-Capillary 131 (H) 70 - 99 mg/dL   No results found.  Review of Systems  Constitutional: Negative.  Negative for chills and fever.  HENT: Negative.   Eyes: Negative.   Respiratory: Negative.  Negative for cough.   Cardiovascular: Negative.   Gastrointestinal: Negative.   Genitourinary: Positive for urgency.  Musculoskeletal: Negative.   Skin: Negative.   Neurological: Negative.   Endo/Heme/Allergies: Negative.   Psychiatric/Behavioral: Negative.     There were no vitals taken for this visit. Physical Exam  Constitutional: He appears well-developed.  HENT:  Head: Normocephalic.  Eyes: Pupils are equal, round, and reactive to light.  Neck: Normal range of motion.  Cardiovascular: Normal rate.  GI: Soft.  Genitourinary:    Genitourinary Comments: No CVAT   Musculoskeletal: Normal range of motion.  Neurological: He is alert.  Skin: Skin is warm.  Psychiatric: He has a normal mood and affect.     Assessment/Plan  Proceed as planned with curative intent prostatectomy. Risks, benefits, alternatives, expected peri-op course discussed previously and reiterated today.   Alexis Frock, MD 06/17/2018, 6:32 AM

## 2018-06-17 NOTE — Transfer of Care (Signed)
Immediate Anesthesia Transfer of Care Note  Patient: Raymond Herring  Procedure(s) Performed: XI ROBOTIC ASSISTED LAPAROSCOPIC RADICAL PROSTATECTOMY (N/A ) LYMPHADENECTOMY (Bilateral )  Patient Location: PACU  Anesthesia Type:General  Level of Consciousness: awake, alert  and oriented  Airway & Oxygen Therapy: Patient Spontanous Breathing and Patient connected to face mask oxygen  Post-op Assessment: Report given to RN and Post -op Vital signs reviewed and stable  Post vital signs: Reviewed and stable  Last Vitals:  Vitals Value Taken Time  BP 153/83 06/17/2018 12:24 PM  Temp    Pulse 97 06/17/2018 12:25 PM  Resp 18 06/17/2018 12:25 PM  SpO2 100 % 06/17/2018 12:25 PM  Vitals shown include unvalidated device data.  Last Pain:  Vitals:   06/17/18 0717  TempSrc:   PainSc: 4       Patients Stated Pain Goal: 4 (76/14/70 [id number])  Complications: No apparent anesthesia complications

## 2018-06-17 NOTE — Discharge Instructions (Signed)
1- Drain Sites - You may have some mild persistent drainage from old drain site for several days, this is normal. This can be covered with cotton gauze for convenience.  2 - Stiches - Your stitches are all dissolvable. You may notice a "loose thread" at your incisions, these are normal and require no intervention. You may cut them flush to the skin with fingernail clippers if needed for comfort.  3 - Diet - No restrictions  4 - Activity - No heavy lifting / straining (any activities that require valsalva or "bearing down") x 4 weeks. Otherwise, no restrictions.  5 - Bathing - You may shower immediately. Do not take a bath or get into swimming pool where incision sites are submersed in water x 4 weeks.   6 - Catheter - Will remain in place until removed at your next appointment. It may be cleaned with soap and water in the shower. It may be disconnected from the drain bag while in the shower to avoid tripping over the tube. You may apply Neosporin or Vaseline ointment as needed to the tip of the penis where the catheter inserts to reduce friction and irritation in this spot.   7 - When to Call the Doctor - Call MD for any fever >102, any acute wound problems, or any severe nausea / vomiting. You can call the [facility] [phone]) 24 hours a day 365 days a year. It will roll-over to the answering service and on-call physician after hours.  You may resume Pletal 2 weeks after surgery.  You may resume Celebrex,aspirin, advil, aleve, vitamins, and supplements 7 days after surgery.

## 2018-06-17 NOTE — Anesthesia Procedure Notes (Signed)
Procedure Name: Intubation Date/Time: 06/17/2018 8:32 AM Performed by: Sharlette Dense, CRNA Patient Re-evaluated:Patient Re-evaluated prior to induction Oxygen Delivery Method: Circle system utilized Preoxygenation: Pre-oxygenation with 100% oxygen Induction Type: IV induction and Rapid sequence Laryngoscope Size: Miller Grade View: Grade I Tube type: Oral Tube size: 8.0 mm Number of attempts: 1 Airway Equipment and Method: Stylet Placement Confirmation: ETT inserted through vocal cords under direct vision,  positive ETCO2 and breath sounds checked- equal and bilateral Secured at: 23 cm Tube secured with: Tape Dental Injury: Teeth and Oropharynx as per pre-operative assessment

## 2018-06-17 NOTE — Brief Op Note (Signed)
06/17/2018  12:07 PM  PATIENT:  Phylliss Blakes [place]  68 y.o. male  PRE-OPERATIVE DIAGNOSIS:  PROSTATE CANCER  POST-OPERATIVE DIAGNOSIS:  PROSTATE CANCER  PROCEDURE:  Procedure(s) with comments: XI ROBOTIC ASSISTED LAPAROSCOPIC RADICAL PROSTATECTOMY (N/A) - 3 HRS LYMPHADENECTOMY (Bilateral)  SURGEON:  Surgeon(s) and Role:    * Alexis Frock, MD - Primary  PHYSICIAN ASSISTANT:   ASSISTANTS: Clemetine Marker PA   ANESTHESIA:   local and general  EBL:  200 mL   BLOOD ADMINISTERED:none  DRAINS: 1 - JP to bulb; 2- Foley to gravity   LOCAL MEDICATIONS USED:  MARCAINE     SPECIMEN:  Source of Specimen:  1 - periprostatic fat; 2 - prostatectomy; 3 - pelvic lymph nodes  DISPOSITION OF SPECIMEN:  PATHOLOGY  COUNTS:  YES  TOURNIQUET:  * No tourniquets in log *  DICTATION: .Other Dictation: Dictation Number [id number]  PLAN OF CARE: Admit for overnight observation  PATIENT DISPOSITION:  PACU - hemodynamically stable.   Delay start of Pharmacological VTE agent (>24hrs) due to surgical blood loss or risk of bleeding: yes

## 2018-06-18 ENCOUNTER — Encounter (HOSPITAL_COMMUNITY): Payer: Self-pay | Admitting: Urology

## 2018-06-18 DIAGNOSIS — I10 Essential (primary) hypertension: Secondary | ICD-10-CM | POA: Diagnosis not present

## 2018-06-18 DIAGNOSIS — C61 Malignant neoplasm of prostate: Secondary | ICD-10-CM | POA: Diagnosis not present

## 2018-06-18 DIAGNOSIS — M1990 Unspecified osteoarthritis, unspecified site: Secondary | ICD-10-CM | POA: Diagnosis not present

## 2018-06-18 DIAGNOSIS — F419 Anxiety disorder, unspecified: Secondary | ICD-10-CM | POA: Diagnosis not present

## 2018-06-18 DIAGNOSIS — E1140 Type 2 diabetes mellitus with diabetic neuropathy, unspecified: Secondary | ICD-10-CM | POA: Diagnosis not present

## 2018-06-18 DIAGNOSIS — E785 Hyperlipidemia, unspecified: Secondary | ICD-10-CM | POA: Diagnosis not present

## 2018-06-18 LAB — GLUCOSE, CAPILLARY
Glucose-Capillary: 109 mg/dL — ABNORMAL HIGH (ref 70–99)
Glucose-Capillary: 109 mg/dL — ABNORMAL HIGH (ref 70–99)

## 2018-06-18 LAB — BASIC METABOLIC PANEL
Anion gap: 9 (ref 5–15)
BUN: 23 mg/dL (ref 8–23)
CO2: 27 mmol/L (ref 22–32)
Calcium: 9 mg/dL (ref 8.9–10.3)
Chloride: 103 mmol/L (ref 98–111)
Creatinine, Ser: 0.96 mg/dL (ref 0.61–1.24)
GFR calc Af Amer: >60 mL/min (ref >60)
GFR calc non Af Amer: >60 mL/min (ref >60)
Glucose, Bld: 114 mg/dL — ABNORMAL HIGH (ref 70–99)
Potassium: 4.5 mmol/L (ref 3.5–5.1)
Sodium: 139 mmol/L (ref 135–145)

## 2018-06-18 LAB — HEMOGLOBIN AND HEMATOCRIT, BLOOD
HCT: 33.1 % — ABNORMAL LOW (ref 39.0–52.0)
Hemoglobin: 11.3 g/dL — ABNORMAL LOW (ref 13.0–17.0)

## 2018-06-18 LAB — CREATININE, FLUID (PLEURAL, PERITONEAL, JP DRAINAGE): Creat, Fluid: 0.9 mg/dL

## 2018-06-18 NOTE — Op Note (Signed)
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  PROCEDURE: 1.  Robotic-assisted laparoscopic radical prostatectomy. 2.  Bilateral pelvic laminectomy. 3.  Injection of indocyanine green dye for sentinel lymph angiography.  ESTIMATED BLOOD LOSS:  200 mL.  COMPLICATIONS:  None.  SPECIMENS: 1.  Periprostatic fat. 2.  Right external iliac lymph nodes. 3.  Right obturator lymph nodes. 4.  Right perivesical lymph nodes, sentinel. 5.  Left external iliac lymph nodes. 6.  Left external iliac lymph nodes, sentinel. 7.  Left obturator lymph nodes, sentinel. 8.  Radical prostatectomy.  DRAINS: 1.  Jackson-Pratt drain to bulb suction. 2.  Foley catheter.  SURGEON:  Alexis Frock, MD  ASSISTANT:  Debbrah Alar, PA  FINDINGS: 1.  Large pubic notch at the anterior pubic symphysis. 2.  Sentinel lymph nodes, right perivesical and left pelvic noted on pathology requisition.  INDICATIONS:  The patient is a very pleasant 68 year old gentleman who was found on workup of elevated PSA to have multifocal adenocarcinoma of the prostate, significant volume.  Options were discussed for management including surveillance protocols  versus ablative therapy versus surgical extirpation.  Given that he already has some obstructive symptoms and his prostate volume is quite large, he has elected to undergo radical prostatectomy with curative intent.  Informed consent was obtained and  placed in the medical record.  DESCRIPTION OF PROCEDURE:  The patient being identified and procedure being radical prostatectomy was confirmed.  Procedure timeout was performed.  Intravenous antibiotics were administered.  General endotracheal anesthesia was induced.  The patient  was  placed into a low lithotomy position.  A sterile field was created.  Prepped and draped the base of the penis, perineum and proximal thighs using iodine and his infraxiphoid, having using chlorhexidine gluconate after he was clipper shaved and further  fashioned on the operative table using 3-inch tape over foam padding and ensuring that his arms were tucked at the side with gel rolls.  A test of steep Trendelenburg positioning was performed, and he was found to be suitably positioned.  Next, a  high-flow, low-pressure pneumoperitoneum was obtained using Veress technique in the supraumbilical midline, having passed the aspiration and drop test.  An 8 mm robotic camera port was then placed in the location.  Laparoscopic examination of the  peritoneal cavity revealed no significant adhesions, no visceral injury.  Additional ports were placed as follows:  Right paramedian 8 mm robotic port, right far lateral 12 mm AirSeal assist port, right paramedian 5 mm suction port, left paramedian 8 mm  robotic port, left far lateral 8 mm robotic port.  Robot was docked and passed the electronic checks.  Attention was then directed at development of space of Retzius.  Incision was made lateral to the right medial umbilical ligament from the midline  towards the area of the internal ring, coursing along the iliac vessels towards the area of the right ureter.  The right vas deferens was encountered, ligated using medial bucket handle, and the right bladder wall was swept away from the pelvic sidewall  towards the area of the endopelvic fascia on the right side.  A mirror image dissection was performed on the left side.  Anterior attachments were taken down using cautery scissors to expose the anterior base of the prostate, which was defatted to better  denote the  bladder neck prostate junction.  The endopelvic fascia was then carefully swept away from the lateral side of the prostate in base to apex orientation after  0.2 mL of indocyanine green dye was injected into each lobe of the prostate using a  percutaneously placed robotically guided spinal needle.  This exposed the dorsal venous complex. This was controlled using a vascular stapler x2 due to angulation from the prominent pubic notch.  Then, approximately 10 minutes post-dye injection, the  pelvis was inspected under near infrared fluorescence light.  Sentinel lymph angiography revealed excellent prostate parenchymal uptake and lymphatic channels coursing towards the area of the pelvic lymph node fields bilaterally.  Next, attention was directed at the right-sided pelvic lymphadenectomy.  A template  lymphadenectomy was performed, first on the right external iliac group with the boundaries being right external iliac artery, vein, pelvic sidewall, iliac bifurcation.  Lymphostasis was achieved with cold clips.  Next, the right obturator group was  dissected free with the boundaries being obturator nerve, pelvic sidewall, right external iliac vein.  Lymphostasis was achieved with cold clips.  Right obturator nerve was inspected following maneuvers and found to be uninjured.  There was a single  dominant sentinel node noted on the right side in a perivesical location quite inferior.  This was dissected free and labeled as such.  Next, lymphadenectomy was performed on the left side of the left external and left obturator groups, respectively.   There were sentinel lymph nodes within the obturator group.  There was a portion of the left external group that had sentinel lymph nodes.  This was denoted as such.  The left obturator nerve was inspected with these maneuvers and found to be uninjured.   Attention was directed to bladder neck dissection.  A lateral release was performed on both sides, which allowed better demarcation of the caliber of the bladder neck, which was then separated from the prostate in anterior, posterior direction, keeping  what appeared to be a  rim of circular muscle fibers each plane of dissection.  Posterior dissection was performed by incising approximately 7 mm inferior posterior portion of the prostate and entering the plane of Denonvilliers.  Bilateral vas deferens  were encountered, dissected for a distance of approximately 4 cm, ligated and placed on gentle superior traction.  Bilateral seminal vesicles were dissected to the tips, placed on gentle superior traction.  Dissection proceeded inferiorly towards the  apex of the prostate.  This exposed the vascular pedicles on each side, which were controlled using a sequential clipping technique in a base to apex orientation.  Final apical dissection was performed in the anterior plane by placing the prostate on  gentle superior traction and transecting the membranous urethra coldly.  This completely freed up the prostatectomy specimen, which was placed into an EndoCatch bag for later retrieval.  Digital rectal exam was then performed with an indicator glove  under laparoscopic vision.  No evidence of rectal violation was noted.  The bladder neck area was inspected.  There was a very small buttonhole noted at approximately 7 o'clock position 1 cm proximal to the area of the bladder neck.  This was oversewn  using figure-of-eight Vicryl.  Next, posterior dissection was performed using a single 3-0 V-Loc suture, reapproximating the posterior urethral plate and the posterior bladder neck, bringing these structures into tension-free apposition.   Mucosa-to-mucosa anastomosis was performed using a double-arm 3-0 V-Loc suture from the 6 o'clock to 12 o'clock position.  Then, a Foley catheter was  then easily placed which irrigated quantitatively.  All sponge and needle counts were correct.   Hemostasis appeared excellent.  A closed suction drain was brought through the previous left lateral-most robotic port site near the peritoneal cavity.  The robot was then undocked.  The previous right lateral-most  robotic port site was closed with  fascia using Carter-Thomason suture passer and Vicryl.  Specimen was retrieved by extending the previous camera port site superiorly for a distance of approximately 4.5 cm, removing the prostatectomy specimen and setting aside for permanent pathology.   The extraction site was closed with fascia using figure-of-eight PDS x4 followed by reapproximation of Scarpa's with a running Vicryl.  All incision sites were infiltrated with dilute lipolyzed Marcaine and closed at the level of skin using subcuticular  Monocryl and applied Dermabond.  The procedure was terminated.  The patient tolerated the procedure well.  No immediate perioperative complications.  The patient was taken to postanesthesia care unit in stable condition.  Please note, first assistant, Debbrah Alar, was crucial for all portions of the surgery today.  She provided invaluable retraction, vascular clipping, vascular stapling, lymphatic clipping, specimen manipulation and general first assistance.  LN/NUANCE  D:06/17/2018 T:06/17/2018 JOB:[id number]

## 2018-06-18 NOTE — Progress Notes (Signed)
Urology Progress Note   1 Day Post-Op s/p RALP  Subjective: NAEON. Pain controlled with pain medications. Tolerating CLD. Not passing flatus. Ambulated x 1.   Objective: Vital signs in last 24 hours: Temp:  [97.5 F (36.4 C)-98.5 F (36.9 C)] 98.2 F (36.8 C) (06/04 0624) Pulse Rate:  [64-97] 64 (06/04 0624) Resp:  [11-26] 16 (06/04 0624) BP: (114-154)/(69-90) 114/76 (06/04 0624) SpO2:  [96 %-100 %] 96 % (06/04 0624) Weight:  [113.4 kg] 113.4 kg (06/04 0624)  Intake/Output from previous day: 06/03 0701 - 06/04 0700 In: 4166.6 [P.O.:480; I.V.:2586.6; IV Piggyback:1100] Out: 2675 [Urine:2275; Drains:200; Blood:200] Intake/Output this shift: No intake/output data recorded.  Physical Exam:  General: Alert and oriented CV: HDS, regular rate Lungs: NWOB on RA Abdomen: Soft, appropriately tender. Mildly distended. Incisions c/d/i. GU: Foley in place draining clear yellow urine with pink tinge Ext: NT, No erythema  Lab Results: Recent Labs    06/17/18 1229 06/18/18 0525  HGB 12.6* 11.3*  HCT 36.8* 33.1*   BMET Recent Labs    06/18/18 0525  NA 139  K 4.5  CL 103  CO2 27  GLUCOSE 114*  BUN 23  CREATININE 0.96  CALCIUM 9.0     Studies/Results: No results found.  Assessment/Plan:  68 y.o. male s/p RALP.  Overall doing well post-op.   - Advance to regular diet. Patient instructed to self-regulate if he develops nausea. - Medlock IVF. - Check JP creatinine. Continue JP drain - will monitor output through this afternoon. - Continue multi-modal pain regimen. - Continue bowel regimen. - DVT Prophylaxis: SCDs, early ambulated (3X today) - If patient is meeting discharge goals this afternoon, plan for discharge. However, if not meeting discharge goals, will plan to keep until tomorrow.     LOS: 0 days

## 2018-06-18 NOTE — Discharge Summary (Signed)
[facility] Discharge Summary  Admit date: 06/17/2018  Discharge date and time: 06/18/18   Discharge to: Home  Discharge Service: Urology  Discharge Attending Physician: Alexis Frock, MD  Discharge  Diagnoses: Prostate cancer  Secondary Diagnosis: Active Problems:   Prostate cancer [facility])   OR Procedures: Procedure(s): XI ROBOTIC ASSISTED LAPAROSCOPIC RADICAL PROSTATECTOMY LYMPHADENECTOMY 06/17/2018   Ancillary Procedures: None   Discharge Day Services: The patient was seen and examined by the Urology team both in the morning and immediately prior to discharge.  Vital signs and laboratory values were stable and within normal limits.  The physical exam was benign and unchanged and all surgical wounds were examined.  Discharge instructions were explained and all questions answered.  Subjective  No acute events overnight. Pain Controlled. No fever or chills.  Objective Patient Vitals for the past 8 hrs:  BP Temp Temp src Pulse Resp SpO2 Weight  06/18/18 0624 114/76 98.2 F (36.8 C) Oral 64 16 96 % 113.4 kg   Total I/O In: -  Out: 50 [Drains:50]  General Appearance:        No acute distress Lungs:                       Normal work of breathing on room air Heart:                                Regular rate and rhythm Abdomen:                         Soft, appropriately tender, very mildly distended, incisions c/d/i Extremities:                      Warm and well perfused   Hospital Course:  The patient underwent robot assisted laparoscopic prostatectomy on 06/17/2018.  The patient tolerated the procedure well, was extubated in the OR, and afterwards was taken to the PACU for routine post-surgical care. When stable the patient was transferred to the floor.   The patient did well postoperatively.  The patient's diet was slowly advanced and at the time of discharge was tolerating a regular diet.  The patient was discharged home 1 Day Post-Op, at which point was tolerating a  regular solid diet, have adequate pain control with P.O. pain medication, and could ambulate without difficulty. The patient will follow up with [place] for post op check. Discharged with foley catheter in place. JP removed prior to discharge.   Condition at Discharge: Improved  Discharge Medications:  Allergies as of 06/18/2018   No Known Allergies     Medication List    STOP taking these medications   B-12 3000 MCG Caps   celecoxib 200 MG capsule Commonly known as:  CELEBREX   cilostazol 100 MG tablet Commonly known as:  PLETAL   doxycycline 100 MG tablet Commonly known as:  VIBRA-TABS   loperamide 2 MG tablet Commonly known as:  IMODIUM A-D   multivitamin with minerals Tabs tablet   saccharomyces boulardii 250 MG capsule Commonly known as:  FLORASTOR   tamsulosin 0.4 MG Caps capsule Commonly known as:  FLOMAX   VITAMIN C PO   Vitamin D3 125 MCG (5000 UT) Caps     TAKE these medications   acetaminophen 500 MG tablet Commonly known as:  TYLENOL Take 1,000 mg by mouth 2 (two) times a day.  allopurinol 300 MG tablet Commonly known as:  ZYLOPRIM Take 300 mg by mouth daily.   BENEFIBER DRINK MIX PO Take 1 Dose by mouth daily.   cephALEXin 500 MG capsule Commonly known as:  KEFLEX Take 2,000 mg by mouth See admin instructions. Take 2000 mg 1 hour prior to dental work   clobetasol 0.05 % topical foam Commonly known as:  OLUX Apply 1 application topically once a week.   Clobetasol Propionate 0.05 % shampoo Apply 1 application topically once a week.   Colcrys 0.6 MG tablet Generic drug:  colchicine Take 0.6 mg by mouth daily as needed (for gout).   gabapentin 300 MG capsule Commonly known as:  NEURONTIN Take 300-600 mg by mouth See admin instructions. Take 300 mg in the morning and 600 mg at night   HYDROcodone-acetaminophen 5-325 MG tablet Commonly known as:  Norco Take 1-2 tablets by mouth every 6 (six) hours as needed for moderate pain or severe pain.    Hypromellose 0.3 % Soln Place 1-2 drops into both eyes 3 (three) times daily as needed (for dry eyes). RETAINE HPMC 0.3%   losartan 100 MG tablet Commonly known as:  COZAAR Take 100 mg by mouth daily.   ONE TOUCH ULTRA TEST test strip Generic drug:  glucose blood   pravastatin 20 MG tablet Commonly known as:  PRAVACHOL Take 20 mg by mouth daily.   sulfamethoxazole-trimethoprim 800-160 MG tablet Commonly known as:  BACTRIM DS Take 1 tablet by mouth 2 (two) times daily. Start the day prior to foley removal appointment What changed:  See the new instructions.   zolpidem 10 MG tablet Commonly known as:  AMBIEN Take 10 mg by mouth at bedtime as needed for sleep.

## 2018-07-07 DIAGNOSIS — M79672 Pain in left foot: Secondary | ICD-10-CM | POA: Insufficient documentation

## 2018-07-07 DIAGNOSIS — M19072 Primary osteoarthritis, left ankle and foot: Secondary | ICD-10-CM | POA: Diagnosis not present

## 2018-07-13 DIAGNOSIS — M6281 Muscle weakness (generalized): Secondary | ICD-10-CM | POA: Diagnosis not present

## 2018-07-13 DIAGNOSIS — N393 Stress incontinence (female) (male): Secondary | ICD-10-CM | POA: Diagnosis not present

## 2018-07-13 DIAGNOSIS — M62838 Other muscle spasm: Secondary | ICD-10-CM | POA: Diagnosis not present

## 2018-07-27 DIAGNOSIS — N393 Stress incontinence (female) (male): Secondary | ICD-10-CM | POA: Diagnosis not present

## 2018-07-27 DIAGNOSIS — M6281 Muscle weakness (generalized): Secondary | ICD-10-CM | POA: Diagnosis not present

## 2018-07-27 DIAGNOSIS — M62838 Other muscle spasm: Secondary | ICD-10-CM | POA: Diagnosis not present

## 2018-08-10 DIAGNOSIS — M62838 Other muscle spasm: Secondary | ICD-10-CM | POA: Diagnosis not present

## 2018-08-10 DIAGNOSIS — N393 Stress incontinence (female) (male): Secondary | ICD-10-CM | POA: Diagnosis not present

## 2018-08-10 DIAGNOSIS — M6281 Muscle weakness (generalized): Secondary | ICD-10-CM | POA: Diagnosis not present

## 2018-08-31 DIAGNOSIS — M62838 Other muscle spasm: Secondary | ICD-10-CM | POA: Diagnosis not present

## 2018-08-31 DIAGNOSIS — N393 Stress incontinence (female) (male): Secondary | ICD-10-CM | POA: Diagnosis not present

## 2018-08-31 DIAGNOSIS — M6281 Muscle weakness (generalized): Secondary | ICD-10-CM | POA: Diagnosis not present

## 2018-09-07 DIAGNOSIS — E1140 Type 2 diabetes mellitus with diabetic neuropathy, unspecified: Secondary | ICD-10-CM | POA: Diagnosis not present

## 2018-09-07 DIAGNOSIS — E785 Hyperlipidemia, unspecified: Secondary | ICD-10-CM | POA: Diagnosis not present

## 2018-09-07 DIAGNOSIS — I10 Essential (primary) hypertension: Secondary | ICD-10-CM | POA: Diagnosis not present

## 2018-09-07 DIAGNOSIS — I739 Peripheral vascular disease, unspecified: Secondary | ICD-10-CM | POA: Diagnosis not present

## 2018-09-07 DIAGNOSIS — E669 Obesity, unspecified: Secondary | ICD-10-CM | POA: Diagnosis not present

## 2018-09-07 DIAGNOSIS — C61 Malignant neoplasm of prostate: Secondary | ICD-10-CM | POA: Diagnosis not present

## 2018-09-07 DIAGNOSIS — E1149 Type 2 diabetes mellitus with other diabetic neurological complication: Secondary | ICD-10-CM | POA: Diagnosis not present

## 2018-09-10 DIAGNOSIS — E1149 Type 2 diabetes mellitus with other diabetic neurological complication: Secondary | ICD-10-CM | POA: Diagnosis not present

## 2018-09-16 ENCOUNTER — Encounter: Payer: Self-pay | Admitting: Podiatry

## 2018-09-16 ENCOUNTER — Other Ambulatory Visit: Payer: Self-pay

## 2018-09-16 ENCOUNTER — Ambulatory Visit (INDEPENDENT_AMBULATORY_CARE_PROVIDER_SITE_OTHER): Payer: Medicare Other | Admitting: Podiatry

## 2018-09-16 DIAGNOSIS — B351 Tinea unguium: Secondary | ICD-10-CM

## 2018-09-16 DIAGNOSIS — D689 Coagulation defect, unspecified: Secondary | ICD-10-CM

## 2018-09-16 DIAGNOSIS — M79675 Pain in left toe(s): Secondary | ICD-10-CM

## 2018-09-16 DIAGNOSIS — M79674 Pain in right toe(s): Secondary | ICD-10-CM

## 2018-09-16 NOTE — Progress Notes (Signed)
Patient ID: Raymond Herring, male   DOB: 01/06/1951, 68 y.o.   MRN: [id number] Complaint:  Visit Type: Patient returns to my office for continued preventative foot care services. Complaint: Patient states" my nails have grown long and thick and become painful to walk and wear shoes" Patient has been diagnosed with DM with neuropathy.. The patient presents for preventative foot care services. No changes to ROS .  Patient is taking pletal.  Podiatric Exam: Vascular: dorsalis pedis and posterior tibial pulses are palpable bilateral. Capillary return is immediate. Temperature gradient is WNL. Skin turgor WNL  Sensorium: Normal Semmes Weinstein monofilament test. Normal tactile sensation bilaterally. Nail Exam: Pt has thick disfigured discolored nails with subungual debris noted bilateral entire nail hallux through fifth toenails Ulcer Exam: There is no evidence of ulcer or pre-ulcerative changes or infection. Orthopedic Exam: Muscle tone and strength are WNL. No limitations in general ROM. No crepitus or effusions noted. Foot type and digits show no abnormalities. Bony prominences are unremarkable. Hot painful  swollen  medial aspect right ankle.  Muscle power WNL.    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 Skin: No Porokeratosis. No infection or ulcers  Diagnosis:  Onychomycosis, , Pain in right toe, pain in left toes,  Diabetes with neuropathy.  Treatment & Plan Procedures and Treatment: Consent by patient was obtained for treatment procedures. The patient understood the discussion of treatment and procedures well. All questions were answered thoroughly reviewed. Debridement of mycotic and hypertrophic toenails, 1 through 5 bilateral and clearing of subungual debris. No ulceration, no infection noted.  Return Visit-Office Procedure: Patient instructed to return to the office for a follow up visit 3 months for continued evaluation and treatment.   Kary Veeda Virgo  DPM 

## 2018-09-23 DIAGNOSIS — C61 Malignant neoplasm of prostate: Secondary | ICD-10-CM | POA: Diagnosis not present

## 2018-09-29 DIAGNOSIS — N5201 Erectile dysfunction due to arterial insufficiency: Secondary | ICD-10-CM | POA: Diagnosis not present

## 2018-09-29 DIAGNOSIS — C61 Malignant neoplasm of prostate: Secondary | ICD-10-CM | POA: Diagnosis not present

## 2018-09-29 DIAGNOSIS — N281 Cyst of kidney, acquired: Secondary | ICD-10-CM | POA: Diagnosis not present

## 2018-09-29 DIAGNOSIS — N393 Stress incontinence (female) (male): Secondary | ICD-10-CM | POA: Diagnosis not present

## 2018-09-30 DIAGNOSIS — N393 Stress incontinence (female) (male): Secondary | ICD-10-CM | POA: Diagnosis not present

## 2018-09-30 DIAGNOSIS — M62838 Other muscle spasm: Secondary | ICD-10-CM | POA: Diagnosis not present

## 2018-09-30 DIAGNOSIS — M6281 Muscle weakness (generalized): Secondary | ICD-10-CM | POA: Diagnosis not present

## 2018-10-13 DIAGNOSIS — M25512 Pain in left shoulder: Secondary | ICD-10-CM | POA: Diagnosis not present

## 2018-10-13 DIAGNOSIS — Z471 Aftercare following joint replacement surgery: Secondary | ICD-10-CM | POA: Diagnosis not present

## 2018-10-13 DIAGNOSIS — Z96653 Presence of artificial knee joint, bilateral: Secondary | ICD-10-CM | POA: Diagnosis not present

## 2018-10-26 DIAGNOSIS — M6281 Muscle weakness (generalized): Secondary | ICD-10-CM | POA: Diagnosis not present

## 2018-10-26 DIAGNOSIS — N393 Stress incontinence (female) (male): Secondary | ICD-10-CM | POA: Diagnosis not present

## 2018-10-26 DIAGNOSIS — M62838 Other muscle spasm: Secondary | ICD-10-CM | POA: Diagnosis not present

## 2018-12-01 DIAGNOSIS — M62838 Other muscle spasm: Secondary | ICD-10-CM | POA: Diagnosis not present

## 2018-12-01 DIAGNOSIS — M6281 Muscle weakness (generalized): Secondary | ICD-10-CM | POA: Diagnosis not present

## 2018-12-01 DIAGNOSIS — N393 Stress incontinence (female) (male): Secondary | ICD-10-CM | POA: Diagnosis not present

## 2018-12-02 DIAGNOSIS — M25519 Pain in unspecified shoulder: Secondary | ICD-10-CM | POA: Diagnosis not present

## 2018-12-16 DIAGNOSIS — M19019 Primary osteoarthritis, unspecified shoulder: Secondary | ICD-10-CM | POA: Diagnosis not present

## 2018-12-16 DIAGNOSIS — M25512 Pain in left shoulder: Secondary | ICD-10-CM | POA: Diagnosis not present

## 2018-12-18 ENCOUNTER — Ambulatory Visit (INDEPENDENT_AMBULATORY_CARE_PROVIDER_SITE_OTHER): Payer: Medicare Other | Admitting: Podiatry

## 2018-12-18 ENCOUNTER — Encounter: Payer: Self-pay | Admitting: Podiatry

## 2018-12-18 ENCOUNTER — Other Ambulatory Visit: Payer: Self-pay

## 2018-12-18 DIAGNOSIS — M79675 Pain in left toe(s): Secondary | ICD-10-CM

## 2018-12-18 DIAGNOSIS — B351 Tinea unguium: Secondary | ICD-10-CM

## 2018-12-18 DIAGNOSIS — D689 Coagulation defect, unspecified: Secondary | ICD-10-CM

## 2018-12-18 DIAGNOSIS — M79674 Pain in right toe(s): Secondary | ICD-10-CM

## 2018-12-18 NOTE — Progress Notes (Signed)
Patient ID: SEARS HAAG, male   DOB: July 07, 1950, 68 y.o.   MRN: [id number] Complaint:  Visit Type: Patient returns to my office for continued preventative foot care services. Complaint: Patient states" my nails have grown long and thick and become painful to walk and wear shoes" Patient has been diagnosed with DM with neuropathy.. The patient presents for preventative foot care services. No changes to ROS .  Patient is taking pletal.  Podiatric Exam: Vascular: dorsalis pedis and posterior tibial pulses are palpable bilateral. Capillary return is immediate. Temperature gradient is WNL. Skin turgor WNL  Sensorium: Normal Semmes Weinstein monofilament test. Normal tactile sensation bilaterally. Nail Exam: Pt has thick disfigured discolored nails with subungual debris noted bilateral entire nail hallux through fifth toenails Ulcer Exam: There is no evidence of ulcer or pre-ulcerative changes or infection. Orthopedic Exam: Muscle tone and strength are WNL. No limitations in general ROM. No crepitus or effusions noted. Foot type and digits show no abnormalities. Bony prominences are unremarkable. Hot painful  swollen  medial aspect right ankle.  Muscle power WNL.    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 Skin: No Porokeratosis. No infection or ulcers  Diagnosis:  Onychomycosis, , Pain in right toe, pain in left toes,  Diabetes with neuropathy.  Treatment & Plan Procedures and Treatment: Consent by patient was obtained for treatment procedures. The patient understood the discussion of treatment and procedures well. All questions were answered thoroughly reviewed. Debridement of mycotic and hypertrophic toenails, 1 through 5 bilateral and clearing of subungual debris. No ulceration, no infection noted.  Return Visit-Office Procedure: Patient instructed to return to the office for a follow up visit 3 months for continued evaluation and treatment.   Gardiner Barefoot  DPM

## 2018-12-21 DIAGNOSIS — M6281 Muscle weakness (generalized): Secondary | ICD-10-CM | POA: Diagnosis not present

## 2018-12-21 DIAGNOSIS — N393 Stress incontinence (female) (male): Secondary | ICD-10-CM | POA: Diagnosis not present

## 2018-12-21 DIAGNOSIS — M62838 Other muscle spasm: Secondary | ICD-10-CM | POA: Diagnosis not present

## 2018-12-24 DIAGNOSIS — M25512 Pain in left shoulder: Secondary | ICD-10-CM | POA: Diagnosis not present

## 2018-12-25 DIAGNOSIS — I10 Essential (primary) hypertension: Secondary | ICD-10-CM | POA: Diagnosis not present

## 2018-12-25 DIAGNOSIS — E7849 Other hyperlipidemia: Secondary | ICD-10-CM | POA: Diagnosis not present

## 2018-12-25 DIAGNOSIS — Z125 Encounter for screening for malignant neoplasm of prostate: Secondary | ICD-10-CM | POA: Diagnosis not present

## 2018-12-25 DIAGNOSIS — M109 Gout, unspecified: Secondary | ICD-10-CM | POA: Diagnosis not present

## 2018-12-25 DIAGNOSIS — R82998 Other abnormal findings in urine: Secondary | ICD-10-CM | POA: Diagnosis not present

## 2018-12-25 DIAGNOSIS — E1149 Type 2 diabetes mellitus with other diabetic neurological complication: Secondary | ICD-10-CM | POA: Diagnosis not present

## 2018-12-31 DIAGNOSIS — M19012 Primary osteoarthritis, left shoulder: Secondary | ICD-10-CM | POA: Insufficient documentation

## 2019-01-01 ENCOUNTER — Other Ambulatory Visit: Payer: Self-pay | Admitting: Orthopedic Surgery

## 2019-01-01 DIAGNOSIS — M25512 Pain in left shoulder: Secondary | ICD-10-CM

## 2019-01-01 DIAGNOSIS — Z0000 Encounter for general adult medical examination without abnormal findings: Secondary | ICD-10-CM | POA: Diagnosis not present

## 2019-01-01 DIAGNOSIS — E1140 Type 2 diabetes mellitus with diabetic neuropathy, unspecified: Secondary | ICD-10-CM | POA: Diagnosis not present

## 2019-01-01 DIAGNOSIS — I10 Essential (primary) hypertension: Secondary | ICD-10-CM | POA: Diagnosis not present

## 2019-01-01 DIAGNOSIS — Z1339 Encounter for screening examination for other mental health and behavioral disorders: Secondary | ICD-10-CM | POA: Diagnosis not present

## 2019-01-01 DIAGNOSIS — E785 Hyperlipidemia, unspecified: Secondary | ICD-10-CM | POA: Diagnosis not present

## 2019-01-01 DIAGNOSIS — E1121 Type 2 diabetes mellitus with diabetic nephropathy: Secondary | ICD-10-CM | POA: Diagnosis not present

## 2019-01-01 DIAGNOSIS — E669 Obesity, unspecified: Secondary | ICD-10-CM | POA: Diagnosis not present

## 2019-01-01 DIAGNOSIS — C61 Malignant neoplasm of prostate: Secondary | ICD-10-CM | POA: Diagnosis not present

## 2019-01-01 DIAGNOSIS — I739 Peripheral vascular disease, unspecified: Secondary | ICD-10-CM | POA: Diagnosis not present

## 2019-01-01 DIAGNOSIS — M109 Gout, unspecified: Secondary | ICD-10-CM | POA: Diagnosis not present

## 2019-01-01 DIAGNOSIS — M14679 Charcot's joint, unspecified ankle and foot: Secondary | ICD-10-CM | POA: Diagnosis not present

## 2019-01-04 ENCOUNTER — Ambulatory Visit
Admission: RE | Admit: 2019-01-04 | Discharge: 2019-01-04 | Disposition: A | Discharge status: Home / Self Care | Payer: Medicare Other | Source: Ambulatory Visit | Attending: Orthopedic Surgery | Admitting: Orthopedic Surgery

## 2019-01-04 DIAGNOSIS — M25512 Pain in left shoulder: Secondary | ICD-10-CM

## 2019-01-07 DIAGNOSIS — Z1212 Encounter for screening for malignant neoplasm of rectum: Secondary | ICD-10-CM | POA: Diagnosis not present

## 2019-01-19 DIAGNOSIS — U071 COVID-19: Secondary | ICD-10-CM | POA: Diagnosis not present

## 2019-01-21 ENCOUNTER — Ambulatory Visit: Payer: Medicare Other | Attending: Internal Medicine

## 2019-01-21 ENCOUNTER — Telehealth: Payer: Self-pay | Admitting: Nurse Practitioner

## 2019-01-21 DIAGNOSIS — Z20822 Contact with and (suspected) exposure to covid-19: Secondary | ICD-10-CM

## 2019-01-21 NOTE — Telephone Encounter (Signed)
Called to Discuss with patient about Covid symptoms and the use of bamlanivimab, a monoclonal antibody infusion for those with mild to moderate Covid symptoms and at a high risk of hospitalization.     Pt is qualified for this infusion at the [facility] infusion center due to co-morbid conditions and/or a member of an at-risk group.     Patient Active Problem List   Diagnosis Date Noted  . Prostate cancer ([place]) 06/17/2018  . S/P TKR (total knee replacement) using cement, right 01/31/2016  . Palpitations 08/31/2012  . Shortness of breath   . Anxiety   . Arthritis   . Primary osteoarthritis of right knee 01/30/2011  . Knee osteoarthritis 01/30/2011    Patient states that he is not having any symptoms at this time. Symptoms tier reviewed as well as criteria for ending isolation. Preventative practices reviewed. Patient verbalized understanding.    Patient advised to call back if he does develop symptoms and decides that he does want to get infusion. Callback number to the infusion center given. Patient advised to go to Urgent care or ED with severe symptoms.

## 2019-01-23 LAB — NOVEL CORONAVIRUS, NAA: SARS-CoV-2, NAA: DETECTED — AB

## 2019-01-29 NOTE — Progress Notes (Signed)
[facility] DR[facility] STORE [place], [address] AT [address] [place] [address] Phone: [phone] Fax: [phone]      Your procedure is scheduled on January 19  Report to [facility] Main Entrance "A" at 1030 A.M., and check in at the Admitting office.  Call this number if you have problems the morning of surgery:  [phone]  Call [phone] if you have any questions prior to your surgery date Monday-Friday 8am-4pm    Remember:  Do not eat or drink after midnight the night before your surgery     Take these medicines the morning of surgery with A SIP OF WATER  acetaminophen (TYLENOL)  If needed allopurinol (ZYLOPRIM)  colchicine (COLCRYS) if needed gabapentin (NEURONTIN)   Follow your surgeon's instructions on when to stop cilostazol (PLETAL).  If no instructions were given by your surgeon then you will need to call the office to get those instructions.    7 days prior to surgery STOP taking any Aspirin (unless otherwise instructed by your surgeon), Aleve, Naproxen, Ibuprofen, Motrin, Advil, Goody's, BC's, all herbal medications, fish oil, and all vitamins. celecoxib (CELEBREX)    WHAT DO I DO ABOUT MY DIABETES MEDICATION?   Marland Kitchen Do not take oral diabetes medicines (pills) the morning of surgery. metFORMIN (GLUCOPHAGE)    HOW TO MANAGE YOUR DIABETES BEFORE AND AFTER SURGERY  Why is it important to control my blood sugar before and after surgery? . Improving blood sugar levels before and after surgery helps healing and can limit problems. . A way of improving blood sugar control is eating a healthy diet by: o  Eating less sugar and carbohydrates o  Increasing activity/exercise o  Talking with your doctor about reaching your blood sugar goals . High blood sugars (greater than 180 mg/dL) can raise your risk of infections and slow your recovery, so you will need to focus on controlling your diabetes during  the weeks before surgery. . Make sure that the doctor who takes care of your diabetes knows about your planned surgery including the date and location.  How do I manage my blood sugar before surgery? . Check your blood sugar at least 4 times a day, starting 2 days before surgery, to make sure that the level is not too high or low. . Check your blood sugar the morning of your surgery when you wake up and every 2 hours until you get to the Short Stay unit. o If your blood sugar is less than 70 mg/dL, you will need to treat for low blood sugar: - Do not take insulin. - Treat a low blood sugar (less than 70 mg/dL) with  cup of clear juice (cranberry or apple), 4 glucose tablets, OR glucose gel. - Recheck blood sugar in 15 minutes after treatment (to make sure it is greater than 70 mg/dL). If your blood sugar is not greater than 70 mg/dL on recheck, call [phone] for further instructions. . Report your blood sugar to the short stay nurse when you get to Short Stay.  . If you are admitted to the hospital after surgery: o Your blood sugar will be checked by the staff and you will probably be given insulin after surgery (instead of oral diabetes medicines) to make sure you have good blood sugar levels. o The goal for blood sugar control after surgery is 80-180 mg/dL.    The Morning of Surgery  Do not wear jewelry  Do not wear lotions, powders, or colognes, or deodorant  Do not shave 48 hours prior to surgery.  Men may shave face and neck.  Do not bring valuables to the hospital.  [facility] is not responsible for any belongings or valuables.  If you are a smoker, DO NOT Smoke 24 hours prior to surgery  If you wear a CPAP at night please bring your mask the morning of surgery   Remember that you must have someone to transport you home after your surgery, and remain with you for 24 hours if you are discharged the same day.   Please bring cases for contacts, glasses, hearing aids, dentures  or bridgework because it cannot be worn into surgery.    Leave your suitcase in the car.  After surgery it may be brought to your room.  For patients admitted to the hospital, discharge time will be determined by your treatment team.  Patients discharged the day of surgery will not be allowed to drive home.    Special instructions:   [place]- Preparing For Surgery  Before surgery, you can play an important role. Because skin is not sterile, your skin needs to be as free of germs as possible. You can reduce the number of germs on your skin by washing with CHG (chlorahexidine gluconate) Soap before surgery.  CHG is an antiseptic cleaner which kills germs and bonds with the skin to continue killing germs even after washing.    Oral Hygiene is also important to reduce your risk of infection.  Remember - BRUSH YOUR TEETH THE MORNING OF SURGERY WITH YOUR REGULAR TOOTHPASTE  Please do not use if you have an allergy to CHG or antibacterial soaps. If your skin becomes reddened/irritated stop using the CHG.  Do not shave (including legs and underarms) for at least 48 hours prior to first CHG shower. It is OK to shave your face.  Please follow these instructions carefully.   1. Shower the NIGHT BEFORE SURGERY and the MORNING OF SURGERY with CHG Soap.   2. If you chose to wash your hair, wash your hair first as usual with your normal shampoo.  3. After you shampoo, rinse your hair and body thoroughly to remove the shampoo.  4. Use CHG as you would any other liquid soap. You can apply CHG directly to the skin and wash gently with a scrungie or a clean washcloth.   5. Apply the CHG Soap to your body ONLY FROM THE NECK DOWN.  Do not use on open wounds or open sores. Avoid contact with your eyes, ears, mouth and genitals (private parts). Wash Face and genitals (private parts)  with your normal soap.   6. Wash thoroughly, paying special attention to the area where your surgery will be  performed.  7. Thoroughly rinse your body with warm water from the neck down.  8. DO NOT shower/wash with your normal soap after using and rinsing off the CHG Soap.  9. Pat yourself dry with a CLEAN TOWEL.  10. Wear CLEAN PAJAMAS to bed the night before surgery, wear comfortable clothes the morning of surgery  11. Place CLEAN SHEETS on your bed the night of your first shower and DO NOT SLEEP WITH PETS.    Day of Surgery:  Please shower the morning of surgery with the CHG soap Do not apply any deodorants/lotions. Please wear clean clothes to the hospital/surgery center.   Remember to brush your teeth WITH YOUR REGULAR TOOTHPASTE.   Please read  over the following fact sheets that you were given.

## 2019-02-01 ENCOUNTER — Other Ambulatory Visit: Payer: Self-pay

## 2019-02-01 ENCOUNTER — Encounter (HOSPITAL_COMMUNITY): Payer: Self-pay

## 2019-02-01 ENCOUNTER — Encounter (HOSPITAL_COMMUNITY)
Admission: RE | Admit: 2019-02-01 | Discharge: 2019-02-01 | Disposition: A | Discharge status: Home / Self Care | Payer: Medicare Other | Source: Ambulatory Visit | Attending: Orthopedic Surgery | Admitting: Orthopedic Surgery

## 2019-02-01 DIAGNOSIS — Z8616 Personal history of COVID-19: Secondary | ICD-10-CM | POA: Diagnosis not present

## 2019-02-01 DIAGNOSIS — Z01812 Encounter for preprocedural laboratory examination: Secondary | ICD-10-CM | POA: Diagnosis not present

## 2019-02-01 LAB — SURGICAL PCR SCREEN
MRSA, PCR: NEGATIVE
Staphylococcus aureus: NEGATIVE

## 2019-02-01 LAB — BASIC METABOLIC PANEL
Anion gap: 10 (ref 5–15)
BUN: 32 mg/dL — ABNORMAL HIGH (ref 8–23)
CO2: 23 mmol/L (ref 22–32)
Calcium: 9.4 mg/dL (ref 8.9–10.3)
Chloride: 105 mmol/L (ref 98–111)
Creatinine, Ser: 1.13 mg/dL (ref 0.61–1.24)
GFR calc Af Amer: >60 mL/min (ref >60)
GFR calc non Af Amer: >60 mL/min (ref >60)
Glucose, Bld: 152 mg/dL — ABNORMAL HIGH (ref 70–99)
Potassium: 4.9 mmol/L (ref 3.5–5.1)
Sodium: 138 mmol/L (ref 135–145)

## 2019-02-01 LAB — CBC
HCT: 36.9 % — ABNORMAL LOW (ref 39.0–52.0)
Hemoglobin: 12.6 g/dL — ABNORMAL LOW (ref 13.0–17.0)
MCH: 33.7 pg (ref 26.0–34.0)
MCHC: 34.1 g/dL (ref 30.0–36.0)
MCV: 98.7 fL (ref 80.0–100.0)
Platelets: 243 10*3/uL (ref 150–400)
RBC: 3.74 MIL/uL — ABNORMAL LOW (ref 4.22–5.81)
RDW: 11.6 % (ref 11.5–15.5)
WBC: 6 10*3/uL (ref 4.0–10.5)
nRBC: 0 % (ref 0.0–0.2)

## 2019-02-01 LAB — GLUCOSE, CAPILLARY: Glucose-Capillary: 155 mg/dL — ABNORMAL HIGH (ref 70–99)

## 2019-02-01 NOTE — Progress Notes (Signed)
PCP - Leanna Battles MD   Chest x-ray - n/a EKG - 06/10/18 Stress Test - 09/25/12 ECHO - 09/25/12 Cardiac Cath -    Fasting Blood Sugar - 100s Checks Blood Sugar __1x___ times a day    ERAS Protcol - yes  PRE-SURGERY Ensure or G2- water given  COVID TEST- tested positive 01/21/19 - PAT appt pt shows no new symptoms, pt had self quarantined, no reports of SOB, cough, or fever.  Anesthesia review: no  Patient denies shortness of breath, fever, cough and chest pain at PAT appointment   All instructions explained to the patient, with a verbal understanding of the material. Patient agrees to go over the instructions while at home for a better understanding. Patient also instructed to self quarantine after being tested for COVID-19. The opportunity to ask questions was provided.

## 2019-02-01 NOTE — Progress Notes (Signed)
[facility] DR[facility] STORE [place], [address] AT [address] [place] [address] Phone: [phone] Fax: [phone]      Your procedure is scheduled on Tuesday, February 02, 2019  Report to [facility] Main Entrance "A" at 1030 A.M., and check in at the Admitting office.  Call this number if you have problems the morning of surgery:  [phone]   Call [phone] if you have any questions prior to your surgery date Monday-Friday 8am-4pm    Remember:  Do not eat or drink after midnight the night before your surgery     Take these medicines the morning of surgery with A SIP OF WATER   acetaminophen (TYLENOL)  If needed  allopurinol (ZYLOPRIM)   colchicine (COLCRYS) if needed  gabapentin (NEURONTIN)    Enhanced Recovery after Surgery for Orthopedics Enhanced Recovery after Surgery is a protocol used to improve the stress on your body and your recovery after surgery.  Patient Instructions  . The night before surgery:  o No food after midnight. ONLY clear liquids after midnight  . The day of surgery (if you have diabetes): o Drink ONE (1) Gatorade 2 (G2) or water as directed o This drink was given to you during your hospital  pre-op appointment visit.  o Finish drink by 0930 morning of surgery  Gatorade 2 (G2) depending on your surgery time. o Color of the Gatorade may vary. Red is not allowed. o Nothing else to drink after completing the  Gatorade 2 (G2).         If you have questions, please contact your surgeon's office.   Follow your surgeon's instructions on when to stop cilostazol (PLETAL).  If no instructions were given by your surgeon then you will need to call the office to get those instructions.    7 days prior to surgery STOP taking any Aspirin (unless otherwise instructed by your surgeon), Aleve, Naproxen, Ibuprofen, Motrin, Advil, Goody's, BC's, all herbal medications, fish oil, and all  vitamins. celecoxib (CELEBREX)    WHAT DO I DO ABOUT MY DIABETES MEDICATION?   Marland Kitchen Do not take oral diabetes medicines (pills) the morning of surgery. metFORMIN (GLUCOPHAGE)    HOW TO MANAGE YOUR DIABETES BEFORE AND AFTER SURGERY  Why is it important to control my blood sugar before and after surgery? . Improving blood sugar levels before and after surgery helps healing and can limit problems. . A way of improving blood sugar control is eating a healthy diet by: o  Eating less sugar and carbohydrates o  Increasing activity/exercise o  Talking with your doctor about reaching your blood sugar goals . High blood sugars (greater than 180 mg/dL) can raise your risk of infections and slow your recovery, so you will need to focus on controlling your diabetes during the weeks before surgery. . Make sure that the doctor who takes care of your diabetes knows about your planned surgery including the date and location.  How do I manage my blood sugar before surgery? . Check your blood sugar at least 4 times a day, starting 2 days before surgery, to make sure that the level is not too high or low. . Check your blood sugar the morning of your surgery when you wake up and every 2 hours until you get to the Short Stay unit. o If your blood sugar is less than 70 mg/dL, you will need to treat for low blood sugar: -  Do not take insulin. - Treat a low blood sugar (less than 70 mg/dL) with  cup of clear juice (cranberry or apple), 4 glucose tablets, OR glucose gel. - Recheck blood sugar in 15 minutes after treatment (to make sure it is greater than 70 mg/dL). If your blood sugar is not greater than 70 mg/dL on recheck, call [phone] for further instructions. . Report your blood sugar to the short stay nurse when you get to Short Stay.  . If you are admitted to the hospital after surgery: o Your blood sugar will be checked by the staff and you will probably be given insulin after surgery (instead of oral  diabetes medicines) to make sure you have good blood sugar levels. o The goal for blood sugar control after surgery is 80-180 mg/dL.    The Morning of Surgery  Do not wear jewelry  Do not wear lotions, powders, colognes, or deodorant  Men may shave face and neck.  Do not bring valuables to the hospital.  [facility] is not responsible for any belongings or valuables.  If you are a smoker, DO NOT Smoke 24 hours prior to surgery  If you wear a CPAP at night please bring your mask the morning of surgery   Remember that you must have someone to transport you home after your surgery, and remain with you for 24 hours if you are discharged the same day.   Please bring cases for contacts, glasses, hearing aids, dentures or bridgework because it cannot be worn into surgery.    Leave your suitcase in the car.  After surgery it may be brought to your room.  For patients admitted to the hospital, discharge time will be determined by your treatment team.  Patients discharged the day of surgery will not be allowed to drive home.    Special instructions:   [place]- Preparing For Surgery  Before surgery, you can play an important role. Because skin is not sterile, your skin needs to be as free of germs as possible. You can reduce the number of germs on your skin by washing with CHG (chlorahexidine gluconate) Soap before surgery.  CHG is an antiseptic cleaner which kills germs and bonds with the skin to continue killing germs even after washing.    Oral Hygiene is also important to reduce your risk of infection.  Remember - BRUSH YOUR TEETH THE MORNING OF SURGERY WITH YOUR REGULAR TOOTHPASTE  Please do not use if you have an allergy to CHG or antibacterial soaps. If your skin becomes reddened/irritated stop using the CHG.  Do not shave (including legs and underarms) for at least 48 hours prior to first CHG shower. It is OK to shave your face.  Please follow these instructions  carefully.   1. Shower the NIGHT BEFORE SURGERY and the MORNING OF SURGERY with CHG Soap.   2. If you chose to wash your hair, wash your hair first as usual with your normal shampoo.  3. After you shampoo, rinse your hair and body thoroughly to remove the shampoo.  4. Use CHG as you would any other liquid soap. You can apply CHG directly to the skin and wash gently with a scrungie or a clean washcloth.   5. Apply the CHG Soap to your body ONLY FROM THE NECK DOWN.  Do not use on open wounds or open sores. Avoid contact with your eyes, ears, mouth and genitals (private parts). Wash Face and genitals (private parts)  with your normal soap.  6. Wash thoroughly, paying special attention to the area where your surgery will be performed.  7. Thoroughly rinse your body with warm water from the neck down.  8. DO NOT shower/wash with your normal soap after using and rinsing off the CHG Soap.  9. Pat yourself dry with a CLEAN TOWEL.  10. Wear CLEAN PAJAMAS to bed the night before surgery, wear comfortable clothes the morning of surgery  11. Place CLEAN SHEETS on your bed the night of your first shower and DO NOT SLEEP WITH PETS.    Day of Surgery:  Please shower the morning of surgery with the CHG soap Do not apply any deodorants/lotions. Please wear clean clothes to the hospital/surgery center.   Remember to brush your teeth WITH YOUR REGULAR TOOTHPASTE.   Please read over the following fact sheets that you were given.

## 2019-02-02 ENCOUNTER — Inpatient Hospital Stay (HOSPITAL_COMMUNITY): Payer: Medicare Other | Admitting: Certified Registered Nurse Anesthetist

## 2019-02-02 ENCOUNTER — Inpatient Hospital Stay (HOSPITAL_COMMUNITY): Payer: Medicare Other

## 2019-02-02 ENCOUNTER — Ambulatory Visit (HOSPITAL_COMMUNITY)
Admission: RE | Admit: 2019-02-02 | Discharge: 2019-02-02 | Disposition: A | Discharge status: Home / Self Care | Payer: Medicare Other | Attending: Orthopedic Surgery | Admitting: Orthopedic Surgery

## 2019-02-02 ENCOUNTER — Other Ambulatory Visit: Payer: Self-pay

## 2019-02-02 ENCOUNTER — Encounter (HOSPITAL_COMMUNITY): Payer: Self-pay | Admitting: Orthopedic Surgery

## 2019-02-02 ENCOUNTER — Encounter (HOSPITAL_COMMUNITY)
Admission: RE | Disposition: A | Discharge status: Home / Self Care | Payer: Self-pay | Source: Home / Self Care | Attending: Orthopedic Surgery

## 2019-02-02 DIAGNOSIS — Z96651 Presence of right artificial knee joint: Secondary | ICD-10-CM | POA: Diagnosis not present

## 2019-02-02 DIAGNOSIS — E119 Type 2 diabetes mellitus without complications: Secondary | ICD-10-CM | POA: Diagnosis not present

## 2019-02-02 DIAGNOSIS — E1140 Type 2 diabetes mellitus with diabetic neuropathy, unspecified: Secondary | ICD-10-CM | POA: Insufficient documentation

## 2019-02-02 DIAGNOSIS — G8918 Other acute postprocedural pain: Secondary | ICD-10-CM | POA: Diagnosis not present

## 2019-02-02 DIAGNOSIS — Z79899 Other long term (current) drug therapy: Secondary | ICD-10-CM | POA: Insufficient documentation

## 2019-02-02 DIAGNOSIS — Z7984 Long term (current) use of oral hypoglycemic drugs: Secondary | ICD-10-CM | POA: Diagnosis not present

## 2019-02-02 DIAGNOSIS — Z6836 Body mass index (BMI) 36.0-36.9, adult: Secondary | ICD-10-CM | POA: Diagnosis not present

## 2019-02-02 DIAGNOSIS — M19012 Primary osteoarthritis, left shoulder: Secondary | ICD-10-CM | POA: Diagnosis not present

## 2019-02-02 DIAGNOSIS — Z96612 Presence of left artificial shoulder joint: Secondary | ICD-10-CM | POA: Diagnosis not present

## 2019-02-02 DIAGNOSIS — F419 Anxiety disorder, unspecified: Secondary | ICD-10-CM | POA: Insufficient documentation

## 2019-02-02 DIAGNOSIS — E785 Hyperlipidemia, unspecified: Secondary | ICD-10-CM | POA: Diagnosis not present

## 2019-02-02 DIAGNOSIS — Z9079 Acquired absence of other genital organ(s): Secondary | ICD-10-CM | POA: Insufficient documentation

## 2019-02-02 DIAGNOSIS — Z87891 Personal history of nicotine dependence: Secondary | ICD-10-CM | POA: Diagnosis not present

## 2019-02-02 DIAGNOSIS — I10 Essential (primary) hypertension: Secondary | ICD-10-CM | POA: Diagnosis not present

## 2019-02-02 DIAGNOSIS — Z471 Aftercare following joint replacement surgery: Secondary | ICD-10-CM | POA: Diagnosis not present

## 2019-02-02 HISTORY — PX: REVERSE SHOULDER ARTHROPLASTY: SHX5054

## 2019-02-02 LAB — GLUCOSE, CAPILLARY
Glucose-Capillary: 129 mg/dL — ABNORMAL HIGH (ref 70–99)
Glucose-Capillary: 161 mg/dL — ABNORMAL HIGH (ref 70–99)

## 2019-02-02 LAB — POCT I-STAT, CHEM 8
BUN: 22 mg/dL (ref 8–23)
Calcium, Ion: 1.25 mmol/L (ref 1.15–1.40)
Chloride: 108 mmol/L (ref 98–111)
Creatinine, Ser: 0.8 mg/dL (ref 0.61–1.24)
Glucose, Bld: 140 mg/dL — ABNORMAL HIGH (ref 70–99)
HCT: 33 % — ABNORMAL LOW (ref 39.0–52.0)
Hemoglobin: 11.2 g/dL — ABNORMAL LOW (ref 13.0–17.0)
Potassium: 5.2 mmol/L — ABNORMAL HIGH (ref 3.5–5.1)
Sodium: 139 mmol/L (ref 135–145)
TCO2: 24 mmol/L (ref 22–32)

## 2019-02-02 SURGERY — ARTHROPLASTY, SHOULDER, TOTAL, REVERSE
Anesthesia: General | Site: Shoulder | Laterality: Left

## 2019-02-02 MED ORDER — CHLORHEXIDINE GLUCONATE 4 % EX LIQD: 60.0000 mL | Freq: Once | CUTANEOUS | Status: DC

## 2019-02-02 MED ORDER — MIDAZOLAM HCL 2 MG/2ML IJ SOLN
INTRAMUSCULAR | Status: AC
  Administered 2019-02-02: 12:00:00 2 mg via INTRAVENOUS
  Filled 2019-02-02: qty 2

## 2019-02-02 MED ORDER — MIDAZOLAM HCL 2 MG/2ML IJ SOLN: 2.0000 mg | Freq: Once | INTRAMUSCULAR | Status: AC

## 2019-02-02 MED ORDER — SUGAMMADEX SODIUM 200 MG/2ML IV SOLN
INTRAVENOUS | Status: DC | PRN
  Administered 2019-02-02: 150 mg via INTRAVENOUS

## 2019-02-02 MED ORDER — METHOCARBAMOL 500 MG PO TABS: 500.0000 mg | ORAL_TABLET | Freq: Four times a day (QID) | ORAL | 1 refills | Status: DC | PRN

## 2019-02-02 MED ORDER — ONDANSETRON 4 MG PO TBDP: 4.0000 mg | ORAL_TABLET | Freq: Three times a day (TID) | ORAL | 0 refills | Status: DC | PRN

## 2019-02-02 MED ORDER — TRANEXAMIC ACID-NACL 1000-0.7 MG/100ML-% IV SOLN
1000.0000 mg | INTRAVENOUS | Status: AC
  Administered 2019-02-02: 1000 mg via INTRAVENOUS
  Filled 2019-02-02 (×2): qty 100

## 2019-02-02 MED ORDER — ONDANSETRON HCL 4 MG/2ML IJ SOLN
INTRAMUSCULAR | Status: DC | PRN
  Administered 2019-02-02: 4 mg via INTRAVENOUS

## 2019-02-02 MED ORDER — OXYCODONE HCL 5 MG PO TABS: 5.0000 mg | ORAL_TABLET | ORAL | 0 refills | Status: DC | PRN

## 2019-02-02 MED ORDER — FENTANYL CITRATE (PF) 250 MCG/5ML IJ SOLN
INTRAMUSCULAR | Status: DC | PRN
  Administered 2019-02-02: 100 ug via INTRAVENOUS

## 2019-02-02 MED ORDER — BUPIVACAINE LIPOSOME 1.3 % IJ SUSP
INTRAMUSCULAR | Status: DC | PRN
  Administered 2019-02-02: 10 mL

## 2019-02-02 MED ORDER — FENTANYL CITRATE (PF) 100 MCG/2ML IJ SOLN: 100.0000 ug | Freq: Once | INTRAMUSCULAR | Status: AC

## 2019-02-02 MED ORDER — CEFAZOLIN SODIUM-DEXTROSE 2-4 GM/100ML-% IV SOLN
2.0000 g | INTRAVENOUS | Status: AC
  Administered 2019-02-02: 2 g via INTRAVENOUS
  Filled 2019-02-02: qty 100

## 2019-02-02 MED ORDER — PROPOFOL 10 MG/ML IV BOLUS
INTRAVENOUS | Status: AC
  Filled 2019-02-02: qty 20

## 2019-02-02 MED ORDER — FENTANYL CITRATE (PF) 250 MCG/5ML IJ SOLN
INTRAMUSCULAR | Status: AC
  Filled 2019-02-02: qty 5

## 2019-02-02 MED ORDER — LACTATED RINGERS IV SOLN: INTRAVENOUS | Status: DC

## 2019-02-02 MED ORDER — CEFAZOLIN SODIUM-DEXTROSE 2-4 GM/100ML-% IV SOLN
INTRAVENOUS | Status: AC
  Filled 2019-02-02: qty 100

## 2019-02-02 MED ORDER — PHENYLEPHRINE HCL-NACL 10-0.9 MG/250ML-% IV SOLN
INTRAVENOUS | Status: DC | PRN
  Administered 2019-02-02: 20 ug/min via INTRAVENOUS

## 2019-02-02 MED ORDER — VANCOMYCIN HCL 1000 MG IV SOLR
INTRAVENOUS | Status: AC
  Filled 2019-02-02: qty 1000

## 2019-02-02 MED ORDER — BUPIVACAINE-EPINEPHRINE (PF) 0.25% -1:200000 IJ SOLN
INTRAMUSCULAR | Status: DC | PRN
  Administered 2019-02-02: 30 mL

## 2019-02-02 MED ORDER — LIDOCAINE 2% (20 MG/ML) 5 ML SYRINGE
INTRAMUSCULAR | Status: DC | PRN
  Administered 2019-02-02: 100 mg via INTRAVENOUS

## 2019-02-02 MED ORDER — FENTANYL CITRATE (PF) 100 MCG/2ML IJ SOLN
INTRAMUSCULAR | Status: AC
  Administered 2019-02-02: 12:00:00 100 ug via INTRAVENOUS
  Filled 2019-02-02: qty 2

## 2019-02-02 MED ORDER — ROCURONIUM BROMIDE 10 MG/ML (PF) SYRINGE
PREFILLED_SYRINGE | INTRAVENOUS | Status: DC | PRN
  Administered 2019-02-02: 80 mg via INTRAVENOUS

## 2019-02-02 MED ORDER — 0.9 % SODIUM CHLORIDE (POUR BTL) OPTIME
TOPICAL | Status: DC | PRN
  Administered 2019-02-02: 1000 mL

## 2019-02-02 MED ORDER — DEXMEDETOMIDINE HCL IN NACL 200 MCG/50ML IV SOLN
INTRAVENOUS | Status: AC
  Filled 2019-02-02: qty 50

## 2019-02-02 MED ORDER — DEXAMETHASONE SODIUM PHOSPHATE 10 MG/ML IJ SOLN
INTRAMUSCULAR | Status: DC | PRN
  Administered 2019-02-02: 10 mg via INTRAVENOUS

## 2019-02-02 MED ORDER — PROPOFOL 10 MG/ML IV BOLUS
INTRAVENOUS | Status: DC | PRN
  Administered 2019-02-02: 100 mg via INTRAVENOUS

## 2019-02-02 MED ORDER — VANCOMYCIN HCL 1 G IV SOLR
INTRAVENOUS | Status: DC | PRN
  Administered 2019-02-02: 1000 mg

## 2019-02-02 SURGICAL SUPPLY — 73 items
BASEPLATE AUG FULL 24 20D (Plate) ×1 IMPLANT
BIT DRILL 5/64X5 DISP (BIT) ×2 IMPLANT
BIT DRILL FLUTED 3.0 STRL (BIT) ×1 IMPLANT
BLADE SAG 18X100X1.27 (BLADE) ×2 IMPLANT
CALIBRATOR GLENOID VIP 5-D (SYSTAGENIX WOUND MANAGEMENT) ×1 IMPLANT
COVER SURGICAL LIGHT HANDLE (MISCELLANEOUS) ×2 IMPLANT
COVER WAND RF STERILE (DRAPES) ×2 IMPLANT
CUP SUT UNIV REVERS 39+2 LT (Shoulder) ×1 IMPLANT
DERMABOND ADVANCED (GAUZE/BANDAGES/DRESSINGS) ×1
DERMABOND ADVANCED .7 DNX12 (GAUZE/BANDAGES/DRESSINGS) IMPLANT
DRAPE IMP U-DRAPE 54X76 (DRAPES) ×4 IMPLANT
DRAPE INCISE IOBAN 66X45 STRL (DRAPES) ×2 IMPLANT
DRAPE ORTHO SPLIT 77X108 STRL (DRAPES) ×2
DRAPE SURG 17X23 STRL (DRAPES) ×2 IMPLANT
DRAPE SURG ORHT 6 SPLT 77X108 (DRAPES) ×2 IMPLANT
DRAPE U-SHAPE 47X51 STRL (DRAPES) ×2 IMPLANT
DRSG AQUACEL AG ADV 3.5X10 (GAUZE/BANDAGES/DRESSINGS) ×2 IMPLANT
DURAPREP 26ML APPLICATOR (WOUND CARE) ×2 IMPLANT
ELECT BLADE 4.0 EZ CLEAN MEGAD (MISCELLANEOUS) ×2
ELECT REM PT RETURN 9FT ADLT (ELECTROSURGICAL) ×2
ELECTRODE BLDE 4.0 EZ CLN MEGD (MISCELLANEOUS) ×1 IMPLANT
ELECTRODE REM PT RTRN 9FT ADLT (ELECTROSURGICAL) ×1 IMPLANT
GLENOSPHERE 39+4 LAT/24 UNI RV (Joint) ×1 IMPLANT
GLOVE BIO SURGEON STRL SZ7.5 (GLOVE) ×2 IMPLANT
GLOVE BIOGEL PI IND STRL 8 (GLOVE) ×1 IMPLANT
GLOVE BIOGEL PI INDICATOR 8 (GLOVE) ×1
GOWN STRL REUS W/ TWL LRG LVL3 (GOWN DISPOSABLE) ×1 IMPLANT
GOWN STRL REUS W/ TWL XL LVL3 (GOWN DISPOSABLE) ×1 IMPLANT
GOWN STRL REUS W/TWL LRG LVL3 (GOWN DISPOSABLE) ×1
GOWN STRL REUS W/TWL XL LVL3 (GOWN DISPOSABLE) ×1
INSERT HUMERAL MED 39/ +3 (Shoulder) IMPLANT
INSERT MEDIUM HUMERAL 39/ +3 (Shoulder) ×1 IMPLANT
KIT BASIN OR (CUSTOM PROCEDURE TRAY) ×2 IMPLANT
KIT TURNOVER KIT B (KITS) ×2 IMPLANT
MANIFOLD NEPTUNE II (INSTRUMENTS) ×2 IMPLANT
NDL 1/2 CIR MAYO (NEEDLE) ×1 IMPLANT
NDL HYPO 25GX1X1/2 BEV (NEEDLE) ×1 IMPLANT
NEEDLE 1/2 CIR MAYO (NEEDLE) ×2 IMPLANT
NEEDLE HYPO 25GX1X1/2 BEV (NEEDLE) ×2 IMPLANT
NS IRRIG 1000ML POUR BTL (IV SOLUTION) ×2 IMPLANT
PACK SHOULDER (CUSTOM PROCEDURE TRAY) ×2 IMPLANT
PAD ARMBOARD 7.5X6 YLW CONV (MISCELLANEOUS) ×4 IMPLANT
PIN NITINOL TARGETER 2.8 (PIN) ×1 IMPLANT
POST MODULAR 35 (Post) ×1 IMPLANT
REAMER ANGLED HEAD SMALL (DRILL) ×1 IMPLANT
RESTRAINT HEAD UNIVERSAL NS (MISCELLANEOUS) ×2 IMPLANT
SCREW PERI LOCK 5.5X24 (Screw) ×2 IMPLANT
SCREW PERI LOCK 5.5X36 (Screw) ×1 IMPLANT
SCREW PERIPHERAL NL 4.5X36 (Screw) ×1 IMPLANT
SLING ARM IMMOBILIZER LRG (SOFTGOODS) IMPLANT
SLING ARM IMMOBILIZER MED (SOFTGOODS) IMPLANT
SPACER SHLD UNI REV 39 +6 (Shoulder) ×1 IMPLANT
SPONGE LAP 18X18 RF (DISPOSABLE) IMPLANT
SPONGE LAP 4X18 RFD (DISPOSABLE) ×2 IMPLANT
STEM HUMERAL UNI REVERS SZ9 (Stem) ×1 IMPLANT
STRIP CLOSURE SKIN 1/2X4 (GAUZE/BANDAGES/DRESSINGS) ×2 IMPLANT
SUCTION FRAZIER HANDLE 10FR (MISCELLANEOUS) ×1
SUCTION TUBE FRAZIER 10FR DISP (MISCELLANEOUS) ×1 IMPLANT
SUT FIBERWIRE #2 38 T-5 BLUE (SUTURE) ×2
SUT MNCRL AB 4-0 PS2 18 (SUTURE) ×2 IMPLANT
SUT SILK 2 0 (SUTURE) ×1
SUT SILK 2-0 18XBRD TIE 12 (SUTURE) IMPLANT
SUT VIC AB 1 CT1 27 (SUTURE)
SUT VIC AB 1 CT1 27XBRD ANBCTR (SUTURE) IMPLANT
SUT VIC AB 2-0 CT1 27 (SUTURE) ×1
SUT VIC AB 2-0 CT1 TAPERPNT 27 (SUTURE) ×1 IMPLANT
SUTURE FIBERWR #2 38 T-5 BLUE (SUTURE) ×1 IMPLANT
SYR CONTROL 10ML LL (SYRINGE) ×2 IMPLANT
TOWEL GREEN STERILE (TOWEL DISPOSABLE) ×2 IMPLANT
TOWEL GREEN STERILE FF (TOWEL DISPOSABLE) ×2 IMPLANT
TOWER CARTRIDGE SMART MIX (DISPOSABLE) IMPLANT
WATER STERILE IRR 1000ML POUR (IV SOLUTION) ×2 IMPLANT
YANKAUER SUCT BULB TIP NO VENT (SUCTIONS) ×2 IMPLANT

## 2019-02-02 NOTE — Discharge Instructions (Signed)
-   maintain sling at all times, unless doing hand/wrist/elbow range of motion. - no lifting with left arm - maintain post op bandage until your follow up appointment, you may shower with that in place, but do not submerge under water - apply ice to the left shoulder 3-5 times per day, for 30 minutes at a time.  - for nausea use Zofran as directed  - for mild to moderate pain use tylenol and advil around the clock.  For breakthrough pain use Robaxin and oxycodone.  - return to see Dr. Stann Mainland in 2 weeks.

## 2019-02-02 NOTE — Brief Op Note (Signed)
02/02/2019  3:07 PM  PATIENT:  Raymond Herring  69 y.o. male  PRE-OPERATIVE DIAGNOSIS:  Left shoulder osteoarthritis  POST-OPERATIVE DIAGNOSIS:  Left shoulder osteoarthritis  PROCEDURE:  Procedure(s) with comments: REVERSE SHOULDER ARTHROPLASTY (Left) - 2.5hrs  SURGEON:  Surgeon(s) and Role:    * Nicholes Stairs, MD - Primary  PHYSICIAN ASSISTANT:   ASSISTANTS: April Green, RNFA   ANESTHESIA:   regional and general  EBL:  200 mL   BLOOD ADMINISTERED:none  DRAINS: none   LOCAL MEDICATIONS USED:  NONE  SPECIMEN:  No Specimen  DISPOSITION OF SPECIMEN:  N/A  COUNTS:  YES  TOURNIQUET:  * No tourniquets in log *  DICTATION: .Note written in EPIC  PLAN OF CARE: Discharge to home after PACU  PATIENT DISPOSITION:  PACU - hemodynamically stable.   Delay start of Pharmacological VTE agent (>24hrs) due to surgical blood loss or risk of bleeding: not applicable

## 2019-02-02 NOTE — Anesthesia Procedure Notes (Signed)
Anesthesia Regional Block: Interscalene brachial plexus block   Pre-Anesthetic Checklist: ,, timeout performed, Correct Patient, Correct Site, Correct Laterality, Correct Procedure, Correct Position, site marked, Risks and benefits discussed,  Surgical consent,  Pre-op evaluation,  At surgeon's request and post-op pain management  Laterality: Left  Prep: chloraprep       Needles:  Injection technique: Single-shot  Needle Type: Echogenic Stimulator Needle     Needle Length: 5cm  Needle Gauge: 22     Additional Needles:   Procedures:, nerve stimulator,,, ultrasound used (permanent image in chart),,,,   Nerve Stimulator or Paresthesia:  Response: quadraceps contraction, 0.45 mA,   Additional Responses:   Narrative:  Start time: 02/02/2019 11:37 AM End time: 02/02/2019 11:40 AM Injection made incrementally with aspirations every 5 mL.  Performed by: Personally  Anesthesiologist: Janeece Riggers, MD  Additional Notes: Functioning IV was confirmed and monitors were applied.  A 1mm 22ga Arrow echogenic stimulator needle was used. Sterile prep and drape,hand hygiene and sterile gloves were used. Ultrasound guidance: relevant anatomy identified, needle position confirmed, local anesthetic spread visualized around nerve(s)., vascular puncture avoided.  Image printed for medical record. Negative aspiration and negative test dose prior to incremental administration of local anesthetic. The patient tolerated the procedure well.  NO PIK

## 2019-02-02 NOTE — Anesthesia Preprocedure Evaluation (Addendum)
Anesthesia Evaluation  Patient identified by MRN, date of birth, ID band Patient awake    Reviewed: Allergy & Precautions, NPO status , Patient's Chart, lab work & pertinent test results  Airway Mallampati: I  TM Distance: >3 FB Neck ROM: Full    Dental no notable dental hx. (+) Teeth Intact, Dental Advisory Given, Missing,    Pulmonary neg pulmonary ROS, former smoker,    Pulmonary exam normal breath sounds clear to auscultation       Cardiovascular hypertension, negative cardio ROS Normal cardiovascular exam Rhythm:Regular Rate:Normal     Neuro/Psych  Headaches, PSYCHIATRIC DISORDERS Anxiety    GI/Hepatic negative GI ROS, (+)     substance abuse  alcohol use,   Endo/Other  diabetes, Well Controlled, Type 2Morbid obesity  Renal/GU negative Renal ROS  negative genitourinary   Musculoskeletal  (+) Arthritis , Osteoarthritis,    Abdominal   Peds  Hematology negative hematology ROS (+)   Anesthesia Other Findings   Reproductive/Obstetrics                            Anesthesia Physical  Anesthesia Plan  ASA: III  Anesthesia Plan: General   Post-op Pain Management: GA combined w/ Regional for post-op pain   Induction: Intravenous  PONV Risk Score and Plan: 1 and Ondansetron, Dexamethasone and Midazolam  Airway Management Planned: Oral ETT  Additional Equipment:   Intra-op Plan:   Post-operative Plan: Extubation in OR  Informed Consent: I have reviewed the patients History and Physical, chart, labs and discussed the procedure including the risks, benefits and alternatives for the proposed anesthesia with the patient or authorized representative who has indicated his/her understanding and acceptance.     Dental advisory given  Plan Discussed with: CRNA, Anesthesiologist and Surgeon  Anesthesia Plan Comments: (Discussed both nerve block for pain relief post-op and GA;  including NV, sore throat, dental injury, and pulmonary complications)        Anesthesia Quick Evaluation

## 2019-02-02 NOTE — Transfer of Care (Signed)
Immediate Anesthesia Transfer of Care Note  Patient: Raymond Herring  Procedure(s) Performed: REVERSE SHOULDER ARTHROPLASTY (Left Shoulder)  Patient Location: PACU  Anesthesia Type:General and GA combined with regional for post-op pain  Level of Consciousness: awake, alert , oriented and patient cooperative  Airway & Oxygen Therapy: Patient Spontanous Breathing  Post-op Assessment: Report given to RN, Post -op Vital signs reviewed and stable and Patient moving all extremities X 4  Post vital signs: Reviewed and stable  Last Vitals:  Vitals Value Taken Time  BP    Temp    Pulse    Resp    SpO2      Last Pain:  Vitals:   02/02/19 1103  TempSrc:   PainSc: 0-No pain         Complications: No apparent anesthesia complications

## 2019-02-02 NOTE — Anesthesia Postprocedure Evaluation (Signed)
Anesthesia Post Note  Patient: Raymond Herring  Procedure(s) Performed: REVERSE SHOULDER ARTHROPLASTY (Left Shoulder)     Patient location during evaluation: PACU Anesthesia Type: General Level of consciousness: awake and alert Pain management: pain level controlled Vital Signs Assessment: post-procedure vital signs reviewed and stable Respiratory status: spontaneous breathing, nonlabored ventilation and respiratory function stable Cardiovascular status: blood pressure returned to baseline and stable Postop Assessment: no apparent nausea or vomiting Anesthetic complications: no    Last Vitals:  Vitals:   02/02/19 1543 02/02/19 1605  BP: (!) 157/92 (!) 141/93  Pulse: 76 75  Resp: 18 18  Temp: 36.8 C   SpO2: 96% 97%    Last Pain:  Vitals:   02/02/19 1543  TempSrc:   PainSc: 0-No pain                 Catalina Gravel

## 2019-02-02 NOTE — Op Note (Signed)
02/02/2019  3:09 PM  PATIENT:  Raymond Herring    PRE-OPERATIVE DIAGNOSIS:  Left shoulder osteoarthritis  POST-OPERATIVE DIAGNOSIS:  Same  PROCEDURE:  REVERSE SHOULDER ARTHROPLASTY  SURGEON:  Nicholes Stairs, MD  ASSISTANT: April Green, RNFA  ANESTHESIA:   General  ESTIMATED BLOOD LOSS: 200 cc  PREOPERATIVE INDICATIONS:  Raymond Herring is a  69 y.o. male with a diagnosis of Left shoulder osteoarthritis who failed conservative measures and elected for surgical management.    The risks benefits and alternatives were discussed with the patient preoperatively including but not limited to the risks of infection, bleeding, nerve injury, cardiopulmonary complications, the need for revision surgery, dislocation, brachial plexus palsy, incomplete relief of pain, among others, and the patient was willing to proceed.  OPERATIVE IMPLANTS:  Arthrex univers 9 stem for reverse 39 + 6 mm humeral insert with + 3 mm poly for total of + 9 build up 24 mm 20 degree Full wedge glenoid augment 35 mm modular post.  1 peripheral non locking screw 3 peripheral locking screws  OPERATIVE FINDINGS:  End-stage osteoarthritis of the left shoulder with contracted and partial tearing of the upper border of the subscapularis.  B2 glenoid with significant retroversion greater than 20 degrees based on preoperative CT and confirmed with intraoperative findings.  Otherwise superior and posterior rotator cuff intact.  OPERATIVE PROCEDURE: The patient was brought to the operating room and placed in the supine position. General anesthesia was administered. IV antibiotics were given. A Foley was placed. Time out was performed. The upper extremity was prepped and draped in usual sterile fashion. The patient was in a beachchair position. Deltopectoral approach was carried out. The biceps was tenodesed to the pectoralis tendon with #2 Fiberwire. The subscapularis was released off of the bone.   I then performed  circumferential releases of the humerus, and then dislocated the head, and then reamed with the reamer to the above named size.  I then applied the jig, and cut the humeral head in 30 of retroversion, and then turned my attention to the glenoid.  Deep retractors were placed, and I resected the labrum, and then placed a guidepin into the center position on the glenoid, with slight inferior inclination.  We used the preoperative Arthrex VIP software to plan our center pin placement.  We were able to place the guide in good position.  We then used the augmented reamer which is a 20 degree full wedge augment.  We had good cancellous blush on the entire glenoid face.  We then, based on our preoperative plan use the 20 degree full wedge placed superior posterior.  The base plate was selected and impacted place.  The central post was impacted into place and had excellent purchase.  We then placed an inferior nonlocking screw that was 36 mm.  We then placed 3 peripheral locking screws into the base plate.  I then turned my attention to the glenosphere, and impacted the 39+4 mm glenosphere into place.  Initially we had some difficulty placing this so we did go on to complete preparation for the humerus before being able to adequately place the glenosphere.  This did also require a second cut on the humeral side of an additional 2 mm.  The glenoid sphere was completely seated, and had engagement of the [facility] taper. I then turned my attention back to the humerus.  I sequentially broached, and then trialed, and was found to restore soft tissue tension, and it had 2 finger tightness.  Therefore the above named components were selected. The shoulder felt stable throughout functional motion.   I then impacted the real prosthesis into place, as well as the real humeral tray with a total of +9 mm build up, and reduced the shoulder. The shoulder had excellent motion, and was stable, and I irrigated the wounds copiously.    Due to his preoperative motion being 0 degrees of external rotation and this correlated with an extremely scarred down and shortened subscapularis, we elected to not repair this back to bone in this lateralized construct.  I then irrigated the shoulder copiously once more, repaired the deltopectoral interval with Vicryl followed by subcutaneous Vicryl and subcuticular Monocryl, with Steri-Strips and sterile gauze for the skin.  Prior to closure the deltopectoral interval was packed with 1 g of vancomycin powder.  The patient was awakened and returned back in stable and satisfactory condition. There no complications and He tolerated the procedure well.  Disposition:  Patient was transported to PACU in stable condition.  He was placed in a shoulder sling with abduction pillow removed.  He will be nonweightbearing to the left upper extremity.  He will begin the reverse shoulder arthroplasty protocol in about 2 weeks.  He will be on the protocol without subscapularis repair.  I will see him in the office in 2 weeks and then again at 6 weeks with x-rays at the 6-week appointment.  He will discharge home today from PACU.

## 2019-02-02 NOTE — Anesthesia Procedure Notes (Signed)
Procedure Name: Intubation Date/Time: 02/02/2019 12:59 PM Performed by: Larene Beach, CRNA Pre-anesthesia Checklist: Patient identified, Emergency Drugs available, Suction available and Patient being monitored Patient Re-evaluated:Patient Re-evaluated prior to induction Oxygen Delivery Method: Circle system utilized Preoxygenation: Pre-oxygenation with 100% oxygen Induction Type: IV induction Ventilation: Oral airway inserted - appropriate to patient size, Two handed mask ventilation required and Mask ventilation with difficulty Laryngoscope Size: Mac and 4 Grade View: Grade II Tube type: Oral Tube size: 7.5 mm Number of attempts: 1 Airway Equipment and Method: Stylet Placement Confirmation: ETT inserted through vocal cords under direct vision,  positive ETCO2 and breath sounds checked- equal and bilateral Secured at: 22 cm Tube secured with: Tape Dental Injury: Teeth and Oropharynx as per pre-operative assessment

## 2019-02-02 NOTE — H&P (Signed)
ORTHOPAEDIC H and P  REQUESTING PHYSICIAN: Nicholes Stairs, MD  PCP:  Leanna Battles, MD  Chief Complaint: left shoulder OA  HPI: Raymond Herring is a 69 y.o. male who complains of significant left shoulder pain.  He has had exhaustive conservative treatment with injections and oral medications as well as activity modification.  He is here today for operative management with a shoulder replacement.  No new complaints today.  Past Medical History:  Diagnosis Date  . Anxiety   . Arthritis   . Diabetes mellitus   . Diverticulosis   . Headache   . Hyperlipidemia   . Hypertension   . Neuropathy    feet and fingers   Past Surgical History:  Procedure Laterality Date  . APPENDECTOMY    . KNEE CARTILAGE SURGERY Bilateral [id number]   right and left knee  . LAPAROSCOPIC APPENDECTOMY  2014  . LYMPHADENECTOMY Bilateral 06/17/2018   Procedure: LYMPHADENECTOMY;  Surgeon: Alexis Frock, MD;  Location: [facility];  Service: Urology;  Laterality: Bilateral;  . ROBOT ASSISTED LAPAROSCOPIC RADICAL PROSTATECTOMY N/A 06/17/2018   Procedure: XI ROBOTIC ASSISTED LAPAROSCOPIC RADICAL PROSTATECTOMY;  Surgeon: Alexis Frock, MD;  Location: [facility];  Service: Urology;  Laterality: N/A;  3 HRS  . TOTAL KNEE ARTHROPLASTY  01/30/2011   Procedure: TOTAL KNEE ARTHROPLASTY;  Surgeon: Tobi Bastos, MD;  Location: [facility];  Service: Orthopedics;  Laterality: Right;  . TOTAL KNEE ARTHROPLASTY Left 01/31/2016   Procedure: LEFT TOTAL KNEE ARTHROPLASTY;  Surgeon: Latanya Maudlin, MD;  Location: [facility];  Service: Orthopedics;  Laterality: Left;   Social History   Socioeconomic History  . Marital status: Married    Spouse name: Not on file  . Number of children: 4  . Years of education: Not on file  . Highest education level: Not on file  Occupational History  . Occupation: retired    Comment: Retired  Tobacco Use  . Smoking status: Former Smoker    Packs/day: 2.00    Years: 25.00    Pack years:  50.00    Types: Cigarettes    Quit date: 01/24/1981    Years since quitting: 38.0  . Smokeless tobacco: Never Used  Substance and Sexual Activity  . Alcohol use: Yes    Alcohol/week: 35.0 standard drinks    Types: 35 Cans of beer per week    Comment: maybe 1 a day  . Drug use: No  . Sexual activity: Not on file  Other Topics Concern  . Not on file  Social History Narrative  . Not on file   Social Determinants of Health   Financial Resource Strain:   . Difficulty of Paying Living Expenses: Not on file  Food Insecurity:   . Worried About Charity fundraiser in the Last Year: Not on file  . Ran Out of Food in the Last Year: Not on file  Transportation Needs:   . Lack of Transportation (Medical): Not on file  . Lack of Transportation (Non-Medical): Not on file  Physical Activity:   . Days of Exercise per Week: Not on file  . Minutes of Exercise per Session: Not on file  Stress:   . Feeling of Stress : Not on file  Social Connections:   . Frequency of Communication with Friends and Family: Not on file  . Frequency of Social Gatherings with Friends and Family: Not on file  . Attends Religious Services: Not on file  . Active Member of Clubs or Organizations: Not on  file  . Attends Archivist Meetings: Not on file  . Marital Status: Not on file   Family History  Problem Relation Age of Onset  . Heart disease Father        Pacemaker  . CVA Father   . Alzheimer's disease Father   . Colon cancer Neg Hx    No Known Allergies Prior to Admission medications   Medication Sig Start Date End Date Taking? Authorizing Provider  acetaminophen (TYLENOL) 500 MG tablet Take 1,000 mg by mouth 2 (two) times a day.   Yes [provider]  allopurinol (ZYLOPRIM) 300 MG tablet Take 300 mg by mouth daily. 06/23/14  Yes [provider]  Ascorbic Acid (VITAMIN C PO) Take 3,000 mg by mouth daily.   Yes [provider]  celecoxib (CELEBREX) 200 MG capsule Take  200 mg by mouth 2 (two) times daily.   Yes [provider]  Cholecalciferol (DIALYVITE VITAMIN D 5000) 125 MCG (5000 UT) capsule Take 5,000 Units by mouth daily.   Yes [provider]  cilostazol (PLETAL) 100 MG tablet Take 100 mg by mouth daily.   Yes [provider]  clobetasol (OLUX) 0.05 % topical foam Apply 1 application topically daily as needed (irritation).    Yes [provider]  colchicine (COLCRYS) 0.6 MG tablet Take 0.6 mg by mouth daily as needed (for gout).   Yes [provider]  gabapentin (NEURONTIN) 300 MG capsule Take 300-600 mg by mouth See admin instructions. Take 300 mg in the morning and 300 mg at night, may increase night dose to 600 mg as needed for pain   Yes [provider]  losartan (COZAAR) 100 MG tablet Take 100 mg by mouth daily.   Yes [provider]  metFORMIN (GLUCOPHAGE) 500 MG tablet Take 500 mg by mouth 2 (two) times daily.  09/07/18  Yes [provider]  Multiple Vitamin (MULTIVITAMIN WITH MINERALS) TABS tablet Take 1 tablet by mouth daily.   Yes [provider]  pravastatin (PRAVACHOL) 20 MG tablet Take 20 mg by mouth daily.   Yes [provider]  saccharomyces boulardii (FLORASTOR) 250 MG capsule Take 250 mg by mouth 2 (two) times daily.   Yes [provider]  Wheat Dextrin (BENEFIBER DRINK MIX PO) Take 1 Dose by mouth daily.    Yes [provider]  zinc gluconate 50 MG tablet Take 50 mg by mouth daily.   Yes [provider]  cephALEXin (KEFLEX) 500 MG capsule Take 2,000 mg by mouth See admin instructions. Take 2000 mg 1 hour prior to dental work    [provider]  Cyanocobalamin (B-12) 3000 MCG CAPS Take 3,000 mcg by mouth daily.    [provider]  Hypromellose 0.3 % SOLN Place 1-2 drops into both eyes 3 (three) times daily as needed (for dry eyes). RETAINE [facility] 0.3%    [provider]  ONE TOUCH ULTRA TEST test strip   10/21/17   [provider]  zolpidem (AMBIEN) 10 MG tablet Take 10 mg by mouth at bedtime as needed for sleep.  09/13/11   [provider]   No results found.  Positive ROS: All other systems have been reviewed and were otherwise negative with the exception of those mentioned in the HPI and as above.  Physical Exam: General: Alert, no acute distress Cardiovascular: No pedal edema Respiratory: No cyanosis, no use of accessory musculature GI: No organomegaly, abdomen is soft and non-tender Skin: No lesions  in the area of chief complaint Neurologic: Sensation intact distally Psychiatric: Patient is competent for consent with normal mood and affect Lymphatic: No axillary or cervical lymphadenopathy  MUSCULOSKELETAL:  Left shoulder skin is warm and well-perfused.  No open wounds.  Distally good 2+ pulse.  Assessment: 1.  Left shoulder end-stage osteoarthritis  Plan: [name] and I again discussed his significant arthritis of the left shoulder.  This is end-stage and he has failed conservative treatment.  Our plan is for reverse shoulder arthroplasty today due to some glenoid sided deformity and deterioration of his subscapularis.  We have previously discussed this at length in the office.  We discussed the risk of bleeding, infection, damage to surrounding structures, DVT, stiffness, dislocation, deep infection, and the risk of anesthesia.  He has provided informed consent.  -Our tentative plan will be for discharge home from PACU.  This is appropriate for his surgery as long as he has adequate analgesia and does well intraoperatively.  He is in agreement with that plan as well.    Nicholes Stairs, MD Cell [phone]    02/02/2019 12:19 PM

## 2019-02-03 ENCOUNTER — Encounter: Payer: Self-pay | Admitting: *Deleted

## 2019-02-16 DIAGNOSIS — M25612 Stiffness of left shoulder, not elsewhere classified: Secondary | ICD-10-CM | POA: Diagnosis not present

## 2019-02-23 DIAGNOSIS — M25612 Stiffness of left shoulder, not elsewhere classified: Secondary | ICD-10-CM | POA: Diagnosis not present

## 2019-03-01 DIAGNOSIS — H2513 Age-related nuclear cataract, bilateral: Secondary | ICD-10-CM | POA: Diagnosis not present

## 2019-03-01 DIAGNOSIS — E119 Type 2 diabetes mellitus without complications: Secondary | ICD-10-CM | POA: Diagnosis not present

## 2019-03-01 DIAGNOSIS — H40013 Open angle with borderline findings, low risk, bilateral: Secondary | ICD-10-CM | POA: Diagnosis not present

## 2019-03-01 DIAGNOSIS — M25619 Stiffness of unspecified shoulder, not elsewhere classified: Secondary | ICD-10-CM | POA: Diagnosis not present

## 2019-03-01 DIAGNOSIS — H25013 Cortical age-related cataract, bilateral: Secondary | ICD-10-CM | POA: Diagnosis not present

## 2019-03-03 DIAGNOSIS — M25612 Stiffness of left shoulder, not elsewhere classified: Secondary | ICD-10-CM | POA: Diagnosis not present

## 2019-03-09 DIAGNOSIS — M25612 Stiffness of left shoulder, not elsewhere classified: Secondary | ICD-10-CM | POA: Diagnosis not present

## 2019-03-11 DIAGNOSIS — M25612 Stiffness of left shoulder, not elsewhere classified: Secondary | ICD-10-CM | POA: Diagnosis not present

## 2019-03-15 DIAGNOSIS — C61 Malignant neoplasm of prostate: Secondary | ICD-10-CM | POA: Diagnosis not present

## 2019-03-16 DIAGNOSIS — M25612 Stiffness of left shoulder, not elsewhere classified: Secondary | ICD-10-CM | POA: Diagnosis not present

## 2019-03-16 DIAGNOSIS — Z4789 Encounter for other orthopedic aftercare: Secondary | ICD-10-CM | POA: Diagnosis not present

## 2019-03-19 ENCOUNTER — Ambulatory Visit (INDEPENDENT_AMBULATORY_CARE_PROVIDER_SITE_OTHER): Payer: Medicare Other | Admitting: Podiatry

## 2019-03-19 ENCOUNTER — Encounter: Payer: Self-pay | Admitting: Podiatry

## 2019-03-19 ENCOUNTER — Other Ambulatory Visit: Payer: Self-pay

## 2019-03-19 DIAGNOSIS — E11610 Type 2 diabetes mellitus with diabetic neuropathic arthropathy: Secondary | ICD-10-CM | POA: Diagnosis not present

## 2019-03-19 DIAGNOSIS — B351 Tinea unguium: Secondary | ICD-10-CM | POA: Diagnosis not present

## 2019-03-19 DIAGNOSIS — M79674 Pain in right toe(s): Secondary | ICD-10-CM | POA: Diagnosis not present

## 2019-03-19 DIAGNOSIS — M79675 Pain in left toe(s): Secondary | ICD-10-CM

## 2019-03-19 DIAGNOSIS — M25619 Stiffness of unspecified shoulder, not elsewhere classified: Secondary | ICD-10-CM | POA: Diagnosis not present

## 2019-03-19 DIAGNOSIS — D689 Coagulation defect, unspecified: Secondary | ICD-10-CM

## 2019-03-19 NOTE — Progress Notes (Signed)
Patient ID: Raymond Herring, male   DOB: 08/22/1950, 69 y.o.   MRN: [id number] Complaint:  Visit Type: Patient returns to my office for continued preventative foot care services. Complaint: Patient states" my nails have grown long and thick and become painful to walk and wear shoes" Patient has been diagnosed with DM with neuropathy.. The patient presents for preventative foot care services. No changes to ROS .  Patient is taking pletal.  Podiatric Exam: Vascular: dorsalis pedis and posterior tibial pulses are palpable bilateral. Capillary return is immediate. Temperature gradient is WNL. Skin turgor WNL  Sensorium: Normal Semmes Weinstein monofilament test. Normal tactile sensation bilaterally. Nail Exam: Pt has thick disfigured discolored nails with subungual debris noted bilateral entire nail hallux through fifth toenails Ulcer Exam: There is no evidence of ulcer or pre-ulcerative changes or infection. Orthopedic Exam: Muscle tone and strength are WNL. No limitations in general ROM. No crepitus or effusions noted. Foot type and digits show no abnormalities. Bony prominences are unremarkable. Hot painful  swollen  medial aspect right ankle.  Muscle power WNL.    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 Skin: No Porokeratosis. No infection or ulcers  Diagnosis:  Onychomycosis, , Pain in right toe, pain in left toes,  Diabetes with neuropathy.  Treatment & Plan Procedures and Treatment: Consent by patient was obtained for treatment procedures. The patient understood the discussion of treatment and procedures well. All questions were answered thoroughly reviewed. Debridement of mycotic and hypertrophic toenails, 1 through 5 bilateral and clearing of subungual debris. No ulceration, no infection noted.  Return Visit-Office Procedure: Patient instructed to return to the office for a follow up visit 3 months for continued evaluation and treatment.   Gardiner Barefoot  DPM

## 2019-03-22 DIAGNOSIS — N393 Stress incontinence (female) (male): Secondary | ICD-10-CM | POA: Diagnosis not present

## 2019-03-22 DIAGNOSIS — C61 Malignant neoplasm of prostate: Secondary | ICD-10-CM | POA: Diagnosis not present

## 2019-03-22 DIAGNOSIS — N5201 Erectile dysfunction due to arterial insufficiency: Secondary | ICD-10-CM | POA: Diagnosis not present

## 2019-03-26 DIAGNOSIS — M25619 Stiffness of unspecified shoulder, not elsewhere classified: Secondary | ICD-10-CM | POA: Diagnosis not present

## 2019-03-30 DIAGNOSIS — M25612 Stiffness of left shoulder, not elsewhere classified: Secondary | ICD-10-CM | POA: Diagnosis not present

## 2019-04-01 DIAGNOSIS — M25619 Stiffness of unspecified shoulder, not elsewhere classified: Secondary | ICD-10-CM | POA: Diagnosis not present

## 2019-04-02 DIAGNOSIS — M19079 Primary osteoarthritis, unspecified ankle and foot: Secondary | ICD-10-CM | POA: Diagnosis not present

## 2019-04-02 DIAGNOSIS — M19071 Primary osteoarthritis, right ankle and foot: Secondary | ICD-10-CM | POA: Diagnosis not present

## 2019-04-02 DIAGNOSIS — E1142 Type 2 diabetes mellitus with diabetic polyneuropathy: Secondary | ICD-10-CM | POA: Diagnosis not present

## 2019-04-02 DIAGNOSIS — M19072 Primary osteoarthritis, left ankle and foot: Secondary | ICD-10-CM | POA: Diagnosis not present

## 2019-04-02 DIAGNOSIS — R2689 Other abnormalities of gait and mobility: Secondary | ICD-10-CM | POA: Diagnosis not present

## 2019-04-02 DIAGNOSIS — M14671 Charcot's joint, right ankle and foot: Secondary | ICD-10-CM | POA: Diagnosis not present

## 2019-04-06 DIAGNOSIS — R2689 Other abnormalities of gait and mobility: Secondary | ICD-10-CM | POA: Insufficient documentation

## 2019-04-08 DIAGNOSIS — M25619 Stiffness of unspecified shoulder, not elsewhere classified: Secondary | ICD-10-CM | POA: Diagnosis not present

## 2019-04-12 DIAGNOSIS — Z0000 Encounter for general adult medical examination without abnormal findings: Secondary | ICD-10-CM | POA: Diagnosis not present

## 2019-04-14 DIAGNOSIS — R2689 Other abnormalities of gait and mobility: Secondary | ICD-10-CM | POA: Diagnosis not present

## 2019-04-14 DIAGNOSIS — M25619 Stiffness of unspecified shoulder, not elsewhere classified: Secondary | ICD-10-CM | POA: Diagnosis not present

## 2019-04-16 DIAGNOSIS — R2689 Other abnormalities of gait and mobility: Secondary | ICD-10-CM | POA: Diagnosis not present

## 2019-04-16 DIAGNOSIS — M25619 Stiffness of unspecified shoulder, not elsewhere classified: Secondary | ICD-10-CM | POA: Diagnosis not present

## 2019-04-20 DIAGNOSIS — R2689 Other abnormalities of gait and mobility: Secondary | ICD-10-CM | POA: Diagnosis not present

## 2019-04-20 DIAGNOSIS — M25619 Stiffness of unspecified shoulder, not elsewhere classified: Secondary | ICD-10-CM | POA: Diagnosis not present

## 2019-04-27 DIAGNOSIS — M25619 Stiffness of unspecified shoulder, not elsewhere classified: Secondary | ICD-10-CM | POA: Diagnosis not present

## 2019-04-27 DIAGNOSIS — R2689 Other abnormalities of gait and mobility: Secondary | ICD-10-CM | POA: Diagnosis not present

## 2019-04-30 DIAGNOSIS — R2689 Other abnormalities of gait and mobility: Secondary | ICD-10-CM | POA: Diagnosis not present

## 2019-04-30 DIAGNOSIS — M25619 Stiffness of unspecified shoulder, not elsewhere classified: Secondary | ICD-10-CM | POA: Diagnosis not present

## 2019-05-03 DIAGNOSIS — M25619 Stiffness of unspecified shoulder, not elsewhere classified: Secondary | ICD-10-CM | POA: Diagnosis not present

## 2019-05-06 DIAGNOSIS — M25619 Stiffness of unspecified shoulder, not elsewhere classified: Secondary | ICD-10-CM | POA: Diagnosis not present

## 2019-05-06 DIAGNOSIS — R2689 Other abnormalities of gait and mobility: Secondary | ICD-10-CM | POA: Diagnosis not present

## 2019-05-10 DIAGNOSIS — M25619 Stiffness of unspecified shoulder, not elsewhere classified: Secondary | ICD-10-CM | POA: Diagnosis not present

## 2019-05-10 DIAGNOSIS — R2689 Other abnormalities of gait and mobility: Secondary | ICD-10-CM | POA: Diagnosis not present

## 2019-05-13 DIAGNOSIS — R2689 Other abnormalities of gait and mobility: Secondary | ICD-10-CM | POA: Diagnosis not present

## 2019-05-13 DIAGNOSIS — M25619 Stiffness of unspecified shoulder, not elsewhere classified: Secondary | ICD-10-CM | POA: Diagnosis not present

## 2019-05-24 DIAGNOSIS — R2689 Other abnormalities of gait and mobility: Secondary | ICD-10-CM | POA: Diagnosis not present

## 2019-05-24 DIAGNOSIS — M25619 Stiffness of unspecified shoulder, not elsewhere classified: Secondary | ICD-10-CM | POA: Diagnosis not present

## 2019-05-25 DIAGNOSIS — M25512 Pain in left shoulder: Secondary | ICD-10-CM | POA: Diagnosis not present

## 2019-05-25 DIAGNOSIS — Z96612 Presence of left artificial shoulder joint: Secondary | ICD-10-CM | POA: Diagnosis not present

## 2019-05-25 DIAGNOSIS — M7712 Lateral epicondylitis, left elbow: Secondary | ICD-10-CM | POA: Diagnosis not present

## 2019-06-02 DIAGNOSIS — M25512 Pain in left shoulder: Secondary | ICD-10-CM | POA: Diagnosis not present

## 2019-06-02 DIAGNOSIS — R2689 Other abnormalities of gait and mobility: Secondary | ICD-10-CM | POA: Diagnosis not present

## 2019-06-04 DIAGNOSIS — R2689 Other abnormalities of gait and mobility: Secondary | ICD-10-CM | POA: Diagnosis not present

## 2019-06-04 DIAGNOSIS — M25512 Pain in left shoulder: Secondary | ICD-10-CM | POA: Diagnosis not present

## 2019-06-04 DIAGNOSIS — M25619 Stiffness of unspecified shoulder, not elsewhere classified: Secondary | ICD-10-CM | POA: Diagnosis not present

## 2019-06-07 DIAGNOSIS — M25619 Stiffness of unspecified shoulder, not elsewhere classified: Secondary | ICD-10-CM | POA: Diagnosis not present

## 2019-06-07 DIAGNOSIS — R2689 Other abnormalities of gait and mobility: Secondary | ICD-10-CM | POA: Diagnosis not present

## 2019-06-11 DIAGNOSIS — R2689 Other abnormalities of gait and mobility: Secondary | ICD-10-CM | POA: Diagnosis not present

## 2019-06-11 DIAGNOSIS — M25619 Stiffness of unspecified shoulder, not elsewhere classified: Secondary | ICD-10-CM | POA: Diagnosis not present

## 2019-06-15 DIAGNOSIS — C61 Malignant neoplasm of prostate: Secondary | ICD-10-CM | POA: Diagnosis not present

## 2019-06-16 DIAGNOSIS — M25619 Stiffness of unspecified shoulder, not elsewhere classified: Secondary | ICD-10-CM | POA: Diagnosis not present

## 2019-06-16 DIAGNOSIS — R2689 Other abnormalities of gait and mobility: Secondary | ICD-10-CM | POA: Diagnosis not present

## 2019-06-17 DIAGNOSIS — E669 Obesity, unspecified: Secondary | ICD-10-CM | POA: Diagnosis not present

## 2019-06-17 DIAGNOSIS — E785 Hyperlipidemia, unspecified: Secondary | ICD-10-CM | POA: Diagnosis not present

## 2019-06-17 DIAGNOSIS — I739 Peripheral vascular disease, unspecified: Secondary | ICD-10-CM | POA: Diagnosis not present

## 2019-06-17 DIAGNOSIS — R269 Unspecified abnormalities of gait and mobility: Secondary | ICD-10-CM | POA: Insufficient documentation

## 2019-06-17 DIAGNOSIS — E1140 Type 2 diabetes mellitus with diabetic neuropathy, unspecified: Secondary | ICD-10-CM | POA: Diagnosis not present

## 2019-06-17 DIAGNOSIS — R2681 Unsteadiness on feet: Secondary | ICD-10-CM | POA: Diagnosis not present

## 2019-06-17 DIAGNOSIS — Z1331 Encounter for screening for depression: Secondary | ICD-10-CM | POA: Diagnosis not present

## 2019-06-17 DIAGNOSIS — I10 Essential (primary) hypertension: Secondary | ICD-10-CM | POA: Diagnosis not present

## 2019-06-17 DIAGNOSIS — E1149 Type 2 diabetes mellitus with other diabetic neurological complication: Secondary | ICD-10-CM | POA: Diagnosis not present

## 2019-06-21 DIAGNOSIS — N5201 Erectile dysfunction due to arterial insufficiency: Secondary | ICD-10-CM | POA: Diagnosis not present

## 2019-06-21 DIAGNOSIS — C61 Malignant neoplasm of prostate: Secondary | ICD-10-CM | POA: Diagnosis not present

## 2019-06-21 DIAGNOSIS — N393 Stress incontinence (female) (male): Secondary | ICD-10-CM | POA: Diagnosis not present

## 2019-06-21 DIAGNOSIS — R3915 Urgency of urination: Secondary | ICD-10-CM | POA: Diagnosis not present

## 2019-06-21 DIAGNOSIS — N281 Cyst of kidney, acquired: Secondary | ICD-10-CM | POA: Diagnosis not present

## 2019-06-23 DIAGNOSIS — R2689 Other abnormalities of gait and mobility: Secondary | ICD-10-CM | POA: Diagnosis not present

## 2019-06-25 ENCOUNTER — Ambulatory Visit (INDEPENDENT_AMBULATORY_CARE_PROVIDER_SITE_OTHER): Payer: Medicare Other | Admitting: Podiatry

## 2019-06-25 ENCOUNTER — Encounter: Payer: Self-pay | Admitting: Podiatry

## 2019-06-25 ENCOUNTER — Other Ambulatory Visit: Payer: Self-pay

## 2019-06-25 DIAGNOSIS — E11610 Type 2 diabetes mellitus with diabetic neuropathic arthropathy: Secondary | ICD-10-CM | POA: Diagnosis not present

## 2019-06-25 DIAGNOSIS — D689 Coagulation defect, unspecified: Secondary | ICD-10-CM

## 2019-06-25 DIAGNOSIS — M79675 Pain in left toe(s): Secondary | ICD-10-CM | POA: Diagnosis not present

## 2019-06-25 DIAGNOSIS — M79674 Pain in right toe(s): Secondary | ICD-10-CM

## 2019-06-25 DIAGNOSIS — B351 Tinea unguium: Secondary | ICD-10-CM | POA: Diagnosis not present

## 2019-06-25 NOTE — Progress Notes (Signed)
This patient returns to my office for at risk foot care.  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  This patient is unable to cut nails himself since the patient cannot reach his nails.These nails are painful walking and wearing shoes.  This patient presents for at risk foot care today.  General Appearance  Alert, conversant and in no acute stress.  Vascular  Dorsalis pedis and posterior tibial  pulses are palpable  bilaterally.  Capillary return is within normal limits  bilaterally. Temperature is within normal limits  bilaterally.  Neurologic  Senn-Weinstein monofilament wire test within normal limits  bilaterally. Muscle power within normal limits bilaterally.  Nails Thick disfigured discolored nails with subungual debris  from hallux to fifth toes bilaterally. No evidence of bacterial infection or drainage bilaterally.  Orthopedic  No limitations of motion  feet .  No crepitus or effusions noted.  No bony pathology or digital deformities noted.  Asymptomatic  HAV.  Limited ROM STJ right foot.  Skin  normotropic skin with no porokeratosis noted bilaterally.  No signs of infections or ulcers noted.     Onychomycosis  Pain in right toes  Pain in left toes  Consent was obtained for treatment procedures.   Mechanical debridement of nails 1-5  bilaterally performed with a nail nipper.  Filed with dremel without incident.    Return office visit    3 months                  Told patient to return for periodic foot care and evaluation due to potential at risk complications.   Azaria Joseantonio Dittmar DPM  

## 2019-06-29 ENCOUNTER — Telehealth: Payer: Self-pay | Admitting: Radiation Oncology

## 2019-06-29 NOTE — Telephone Encounter (Signed)
Received voicemail message from patient requesting return call. Phoned patient back to inquire. Patient wishes to confirm in person consult appointment for 07/27/2019 at 0730 with the nurse and 0800 with Dr. Tammi Klippel. Patient encouraged to call with future questions or needs.

## 2019-06-30 DIAGNOSIS — M25512 Pain in left shoulder: Secondary | ICD-10-CM | POA: Diagnosis not present

## 2019-07-26 NOTE — Progress Notes (Signed)
Radiation Oncology         [phone] ________________________________  Initial outpatient Consultation  Name: Raymond Herring MRN: [id number]  Date: 07/27/2019  DOB: 1950-11-18  [id number], Quillian Quince, MD  Alexis Frock, MD   REFERRING PHYSICIAN: Alexis Frock, MD  DIAGNOSIS: 69 y.o. gentleman with biochemical recurrance of Stage pT3a, Gleason score of 4+3 adenocarcinoma of the prostate s/p RALP in 06/2018.    ICD-10-CM   1. Malignant neoplasm of prostate ([place])  C61   2. Prostate cancer [facility])  C61     HISTORY OF PRESENT ILLNESS: Raymond Herring is a 69 y.o. malewith a diagnosis of prostate cancer.  He was initially diagnosed with Gleason 3+4 adenocarcinoma the prostate in 5 of 12 cores on transrectal ultrasound biopsy with Dr. Tresa Moore in March 2020.  His PSA the time of diagnosis was 4.8.  He elected to proceed with robotic prostatectomy which was performed on 06/17/2018.  Surgical pathology confirmed pT3a, Gleason 4+3 adenocarcinoma of the prostate with focal extracapsular extension but negative margins, no lymph node involvement and no seminal vesicle involvement.  His postoperative PSA was undetectable on 10/04/2018 but became low detectable at 0.03 in March 2021 and most recently remained detectable at 0.037 in 06/2019.  The patient reviewed the surgical pathology and recent PSA results with his urologist and he has kindly been referred today for discussion of potential adjuvant radiation treatment.   PREVIOUS RADIATION THERAPY: No  PAST MEDICAL HISTORY:  Past Medical History:  Diagnosis Date  . Anxiety   . Arthritis   . Diabetes mellitus   . Diverticulosis   . Headache   . Hyperlipidemia   . Hypertension   . Neuropathy    feet and fingers  . Prostate cancer ([place])   . Sarcoidosis of central nervous system    negatively affects gait  . Skin cancer    Multiple areas removed. Managed by Dr. Lois Huxley.      PAST SURGICAL HISTORY: Past Surgical History:  Procedure  Laterality Date  . APPENDECTOMY    . KNEE CARTILAGE SURGERY Bilateral [phone]   right and left knee  . LAPAROSCOPIC APPENDECTOMY  2014  . LYMPHADENECTOMY Bilateral 06/17/2018   Procedure: LYMPHADENECTOMY;  Surgeon: Alexis Frock, MD;  Location: [facility];  Service: Urology;  Laterality: Bilateral;  . REVERSE SHOULDER ARTHROPLASTY Left 02/02/2019   Procedure: REVERSE SHOULDER ARTHROPLASTY;  Surgeon: Nicholes Stairs, MD;  Location: [place];  Service: Orthopedics;  Laterality: Left;  2.5hrs  . ROBOT ASSISTED LAPAROSCOPIC RADICAL PROSTATECTOMY N/A 06/17/2018   Procedure: XI ROBOTIC ASSISTED LAPAROSCOPIC RADICAL PROSTATECTOMY;  Surgeon: Alexis Frock, MD;  Location: [facility];  Service: Urology;  Laterality: N/A;  3 HRS  . TOTAL KNEE ARTHROPLASTY  01/30/2011   Procedure: TOTAL KNEE ARTHROPLASTY;  Surgeon: Tobi Bastos, MD;  Location: [facility];  Service: Orthopedics;  Laterality: Right;  . TOTAL KNEE ARTHROPLASTY Left 01/31/2016   Procedure: LEFT TOTAL KNEE ARTHROPLASTY;  Surgeon: Latanya Maudlin, MD;  Location: [facility];  Service: Orthopedics;  Laterality: Left;    FAMILY HISTORY:  Family History  Problem Relation Age of Onset  . Heart disease Father        Pacemaker  . CVA Father   . Alzheimer's disease Father   . Colon cancer Neg Hx   . Breast cancer Neg Hx   . Pancreatic cancer Neg Hx   . Prostate cancer Neg Hx     SOCIAL HISTORY:  Social History   Socioeconomic History  . Marital status:  Married    Spouse name: Adele  . Number of children: 4  . Years of education: Not on file  . Highest education level: Not on file  Occupational History  . Occupation: retired  Tobacco Use  . Smoking status: Former Smoker    Packs/day: 2.00    Years: 25.00    Pack years: 50.00    Types: Cigarettes    Quit date: 01/24/1981    Years since quitting: 38.5  . Smokeless tobacco: Never Used  Vaping Use  . Vaping Use: Never used  Substance and Sexual Activity  . Alcohol use: Yes     Alcohol/week: 35.0 standard drinks    Types: 35 Cans of beer per week    Comment: maybe 1 a day  . Drug use: No  . Sexual activity: Not Currently  Other Topics Concern  . Not on file  Social History Narrative  . Not on file   Social Determinants of Health   Financial Resource Strain:   . Difficulty of Paying Living Expenses:   Food Insecurity:   . Worried About Charity fundraiser in the Last Year:   . Arboriculturist in the Last Year:   Transportation Needs:   . Film/video editor (Medical):   Marland Kitchen Lack of Transportation (Non-Medical):   Physical Activity:   . Days of Exercise per Week:   . Minutes of Exercise per Session:   Stress:   . Feeling of Stress :   Social Connections:   . Frequency of Communication with Friends and Family:   . Frequency of Social Gatherings with Friends and Family:   . Attends Religious Services:   . Active Member of Clubs or Organizations:   . Attends Archivist Meetings:   Marland Kitchen Marital Status:   Intimate Partner Violence:   . Fear of Current or Ex-Partner:   . Emotionally Abused:   Marland Kitchen Physically Abused:   . Sexually Abused:     ALLERGIES: Patient has no known allergies.  MEDICATIONS:  Current Outpatient Medications  Medication Sig Dispense Refill  . acetaminophen (TYLENOL) 500 MG tablet acetaminophen 500 mg tablet   1000 mg twice a day by oral route.    Marland Kitchen allopurinol (ZYLOPRIM) 300 MG tablet Take 300 mg by mouth daily.  12  . Ascorbic Acid (VITAMIN C PO) Take 3,000 mg by mouth daily.    . celecoxib (CELEBREX) 200 MG capsule Take 200 mg by mouth 2 (two) times daily.    . Cholecalciferol (DIALYVITE VITAMIN D 5000) 125 MCG (5000 UT) capsule Take 5,000 Units by mouth daily.    . cilostazol (PLETAL) 100 MG tablet Take 100 mg by mouth daily.    . clobetasol (OLUX) 0.05 % topical foam Apply 1 application topically daily as needed (irritation).     . Cyanocobalamin (B-12) 3000 MCG CAPS Take 3,000 mcg by mouth daily.    Marland Kitchen gabapentin  (NEURONTIN) 300 MG capsule Take 300-600 mg by mouth See admin instructions. Take 300 mg in the morning and 300 mg at night, may increase night dose to 600 mg as needed for pain    . Hypromellose 0.3 % SOLN Place 1-2 drops into both eyes 3 (three) times daily as needed (for dry eyes). RETAINE [facility] 0.3%    . losartan (COZAAR) 100 MG tablet Take 100 mg by mouth daily.    . metFORMIN (GLUCOPHAGE) 500 MG tablet Take 500 mg by mouth 2 (two) times daily.     . Multiple Vitamin (  MULTIVITAMIN WITH MINERALS) TABS tablet Take 1 tablet by mouth daily.    . ONE TOUCH ULTRA TEST test strip     . pravastatin (PRAVACHOL) 20 MG tablet Take 20 mg by mouth daily.    Marland Kitchen saccharomyces boulardii (FLORASTOR) 250 MG capsule Take 250 mg by mouth 2 (two) times daily.    . Wheat Dextrin (BENEFIBER DRINK MIX PO) Take 1 Dose by mouth daily.     Marland Kitchen zinc gluconate 50 MG tablet Take 50 mg by mouth daily.    Marland Kitchen zolpidem (AMBIEN) 10 MG tablet Take 10 mg by mouth at bedtime as needed for sleep.     Marland Kitchen amoxicillin (AMOXIL) 500 MG tablet amoxicillin 500 mg tablet  TAKE FOUR TS PO 1 HOUR B DAPP (Patient not taking: Reported on 07/27/2019)    . cephALEXin (KEFLEX) 500 MG capsule Take 2,000 mg by mouth See admin instructions. Take 2000 mg 1 hour prior to dental work (Patient not taking: Reported on 07/27/2019)    . colchicine (COLCRYS) 0.6 MG tablet Colcrys 0.6 mg tablet   0.6 mg by oral route. (Patient not taking: Reported on 07/27/2019)     No current facility-administered medications for this encounter.    REVIEW OF SYSTEMS:  On review of systems, the patient reports that he is doing well overall. He denies any chest pain, shortness of breath, cough, fevers, chills, night sweats, unintended weight changes. He denies any bowel disturbances, and denies abdominal pain, nausea or vomiting. He denies any new musculoskeletal or joint aches or pains. His IPSS was 4, indicating mild urinary symptoms. He has regained excellent bladder control with  only occasional small volume SUI with straining, coughing or sneezing. His SHIM was 1, indicating he has severe erectile dysfunction which is not a priority for him. A complete review of systems is obtained and is otherwise negative.    PHYSICAL EXAM:  Wt Readings from Last 3 Encounters:  07/27/19 278 lb 12.8 oz (126.5 kg)  02/02/19 260 lb (117.9 kg)  02/01/19 263 lb 4.8 oz (119.4 kg)   Temp Readings from Last 3 Encounters:  02/02/19 98.2 F (36.8 C)  02/01/19 98.4 F (36.9 C)  06/18/18 98.8 F (37.1 C)   BP Readings from Last 3 Encounters:  02/02/19 (!) 141/93  02/01/19 (!) 141/80  06/18/18 131/71   Pulse Readings from Last 3 Encounters:  02/02/19 75  02/01/19 73  06/18/18 77   Pain Assessment Pain Score: 7  Pain Frequency: Constant Pain Loc:  (related to neuropathy)/10  In general this is a well appearing caucasian male in no acute distress. He is alert and oriented x4 and appropriate throughout the examination. HEENT reveals that the patient is normocephalic, atraumatic. EOMs are intact. PERRLA. Skin is intact without any evidence of gross lesions. Cardiovascular exam reveals a regular rate and rhythm, no clicks rubs or murmurs are auscultated. Chest is clear to auscultation bilaterally. Lymphatic assessment is performed and does not reveal any adenopathy in the cervical, supraclavicular, axillary, or inguinal chains. Abdomen has active bowel sounds in all quadrants and is intact. The abdomen is soft, non tender, non distended. Lower extremities are negative for pretibial pitting edema, deep calf tenderness, cyanosis or clubbing.  KPS = 100  100 - Normal; no complaints; no evidence of disease. 90   - Able to carry on normal activity; minor signs or symptoms of disease. 80   - Normal activity with effort; some signs or symptoms of disease. 52   - Cares for self;  unable to carry on normal activity or to do active work. 60   - Requires occasional assistance, but is able to  care for most of his personal needs. 50   - Requires considerable assistance and frequent medical care. 72   - Disabled; requires special care and assistance. 7   - Severely disabled; hospital admission is indicated although death not imminent. 71   - Very sick; hospital admission necessary; active supportive treatment necessary. 10   - Moribund; fatal processes progressing rapidly. 0     - Dead  Karnofsky DA, Abelmann A[place]ton, Craver LS and Burchenal [facility] [phone]) The use of the nitrogen mustards in the palliative treatment of carcinoma: with particular reference to bronchogenic carcinoma Cancer 1 [phone]  LABORATORY DATA:  Lab Results  Component Value Date   WBC 6.0 02/01/2019   HGB 11.2 (L) 02/02/2019   HCT 33.0 (L) 02/02/2019   MCV 98.7 02/01/2019   PLT 243 02/01/2019   Lab Results  Component Value Date   NA 139 02/02/2019   K 5.2 (H) 02/02/2019   CL 108 02/02/2019   CO2 23 02/01/2019   Lab Results  Component Value Date   ALT 25 01/26/2016   AST 22 01/26/2016   ALKPHOS 63 01/26/2016   BILITOT 1.0 01/26/2016     RADIOGRAPHY: No results found.    IMPRESSION/PLAN: 1. 70 y.o. gentleman with Stage T3a adenocarcinoma of the prostate with Gleason Score of 3+4, and PSA of 4.8. Today we reviewed the findings and workup thus far.  We discussed the natural history of prostate cancer.  We reviewed the the implications of positive margins, extracapsular extension, and seminal vesicle involvement on the risk of prostate cancer recurrence, particularly in light of a rising, low detectable PSA. We reviewed some of the evidence suggesting an advantage for patients who undergo adjuvant radiotherapy in this setting in terms of disease control and overall survival. We also discussed some of the dilemmas related to the available evidence.  We discussed the SWOG trial which did show an improvement in disease-free survival as well as overall survival using adjuvant radiotherapy. However, we discussed the  fact that the study did not carefully control the usage of adjuvant radiotherapy in the observation arm. There is increasing evidence that careful surveillance with ultrasensitive PSA may provide an opportunity for early salvage in patients who undergo observation, which can lead to excellent results in terms of disease control and survival. We discussed radiation treatment directed to the prostatic fossa with regard to the logistics and delivery of external beam radiation treatment.  He was encouraged to ask questions that were answered to his stated satisfaction.  At the conclusion of our conversation, the patient would like to proceed with a 7.5 week course of adjuvant radiotherapy.  He appears to have a good understanding of his disease and our treatment recommendations which are of curative intent.  He has provided verbal consent to proceed today in the office and is tentatively scheduled for CT simulation/treatment planning at 9:30 AM on Friday, 07/30/2019.  He will sign formal written consent to proceed at that time and a copy of this document will be placed in his medical record.  We will share our findings with Dr. Tresa Moore and move forward with treatment planning accordingly.      Nicholos Johns, PA-C    Tyler Pita, MD  [facility] Direct Dial: [phone]  Fax: [phone] [email or web address]  Skype  [email or web address]   This document serves as a  record of services personally performed by Tyler Pita, MD. It was created on his behalf by Jacqualyn Posey, a trained medical scribe. The creation of this record is based on the scribe's personal observations and the provider's statements to them. This document has been checked and approved by the attending provider.

## 2019-07-27 ENCOUNTER — Other Ambulatory Visit: Payer: Self-pay

## 2019-07-27 ENCOUNTER — Ambulatory Visit
Admission: RE | Admit: 2019-07-27 | Discharge: 2019-07-27 | Disposition: A | Discharge status: Home / Self Care | Payer: Medicare Other | Source: Ambulatory Visit | Attending: Radiation Oncology | Admitting: Radiation Oncology

## 2019-07-27 ENCOUNTER — Encounter: Payer: Self-pay | Admitting: Medical Oncology

## 2019-07-27 ENCOUNTER — Encounter: Payer: Self-pay | Admitting: Radiation Oncology

## 2019-07-27 VITALS — Ht 71.0 in | Wt 278.8 lb

## 2019-07-27 DIAGNOSIS — Z923 Personal history of irradiation: Secondary | ICD-10-CM | POA: Insufficient documentation

## 2019-07-27 DIAGNOSIS — C61 Malignant neoplasm of prostate: Secondary | ICD-10-CM

## 2019-07-27 DIAGNOSIS — R9721 Rising PSA following treatment for malignant neoplasm of prostate: Secondary | ICD-10-CM | POA: Diagnosis not present

## 2019-07-27 DIAGNOSIS — Z9079 Acquired absence of other genital organ(s): Secondary | ICD-10-CM | POA: Diagnosis not present

## 2019-07-27 HISTORY — DX: Unspecified malignant neoplasm of skin, unspecified: C44.90

## 2019-07-27 HISTORY — DX: Sarcoidosis of other sites: D86.89

## 2019-07-27 HISTORY — DX: Malignant neoplasm of prostate: C61

## 2019-07-27 NOTE — Progress Notes (Signed)
Introduced myself to patient as the prostate nurse navigator and discussed my role. He underwent robotic prostatectomy 06/17/18 and now with rising PSA. He is doing well and ready to move forward with salvage radiation. He is scheduled for CT simulation 7/16. No barriers to care were identified.  I gave him my business card and asked him to call me with questions or concerns. He voiced understanding.

## 2019-07-27 NOTE — Progress Notes (Signed)
GU Location of Tumor / Histology: prostatic adenocarcinoma  If Prostate Cancer, Gleason Score is (4 + 3) and PSA is (4.8)    Pathology from prostatectomy:   Past/Anticipated interventions by urology, if any: prostate biopsy, prostatectomy, referral to Dr. Tammi Klippel to discuss radiation therapy options  Past/Anticipated interventions by medical oncology, if any: no  Weight changes, if any: no  Bowel/Bladder complaints, if any: IPSS 4. SHIM 1. Denies dysuria or hematuria. Reports occasional stress incontinence. Denies any bowel complaints.    Nausea/Vomiting, if any: no  Pain issues, if any:  Yes, chronic pain related to neuropathy and multiple joint replacements.  SAFETY ISSUES:  Prior radiation? no  Pacemaker/ICD? no  Possible current pregnancy? no, male patient  Is the patient on methotrexate? no  Current Complaints / other details:  69 year old male. Married. Retired. Walks with a cane.

## 2019-07-29 NOTE — Progress Notes (Signed)
  Radiation Oncology         [phone] ________________________________  Name: Raymond Herring MRN: [id number]  Date: 07/30/2019  DOB: 15-Jan-1950  SIMULATION AND TREATMENT PLANNING NOTE    ICD-10-CM   1. Prostate cancer [facility])  C61     DIAGNOSIS:   69 y.o. gentleman with biochemical recurrance of Stage pT3a, Gleason score of 4+3 adenocarcinoma of the prostate s/p RALP in 06/2018 with PSA of 0.037   NARRATIVE:  The patient was brought to the [place].  Identity was confirmed.  All relevant records and images related to the planned course of therapy were reviewed.  The patient freely provided informed written consent to proceed with treatment after reviewing the details related to the planned course of therapy. The consent form was witnessed and verified by the simulation staff.  Then, the patient was set-up in a stable reproducible supine position for radiation therapy.  A vacuum lock pillow device was custom fabricated to position his legs in a reproducible immobilized position.  Then, I performed a urethrogram under sterile conditions to identify the prostatic apex.  CT images were obtained.  Surface markings were placed.  The CT images were loaded into the planning software.  Then the prostate target and avoidance structures including the rectum, bladder, bowel and hips were contoured.  Treatment planning then occurred.  The radiation prescription was entered and confirmed.  A total of one complex treatment devices was fabricated. I have requested : Intensity Modulated Radiotherapy (IMRT) is medically necessary for this case for the following reason:  Rectal sparing.Marland Kitchen  PLAN:  The patient will receive 68.4 Gy in 38 fractions.  ________________________________  Sheral Apley Tammi Klippel, M.D.   This document serves as a record of services personally performed by Tyler Pita, MD. It was created on his behalf by Jacqualyn Posey, a trained medical scribe. The creation of this record  is based on the scribe's personal observations and the provider's statements to them. This document has been checked and approved by the attending provider.

## 2019-07-30 ENCOUNTER — Encounter: Payer: Self-pay | Admitting: Medical Oncology

## 2019-07-30 ENCOUNTER — Other Ambulatory Visit: Payer: Self-pay

## 2019-07-30 ENCOUNTER — Ambulatory Visit
Admission: RE | Admit: 2019-07-30 | Discharge: 2019-07-30 | Disposition: A | Discharge status: Home / Self Care | Payer: Medicare Other | Source: Ambulatory Visit | Attending: Radiation Oncology | Admitting: Radiation Oncology

## 2019-07-30 DIAGNOSIS — C61 Malignant neoplasm of prostate: Secondary | ICD-10-CM

## 2019-07-30 DIAGNOSIS — Z510 Encounter for antineoplastic radiation therapy: Secondary | ICD-10-CM | POA: Insufficient documentation

## 2019-08-04 DIAGNOSIS — Z510 Encounter for antineoplastic radiation therapy: Secondary | ICD-10-CM | POA: Diagnosis not present

## 2019-08-04 DIAGNOSIS — C61 Malignant neoplasm of prostate: Secondary | ICD-10-CM | POA: Diagnosis not present

## 2019-08-11 ENCOUNTER — Other Ambulatory Visit: Payer: Self-pay

## 2019-08-11 ENCOUNTER — Ambulatory Visit
Admission: RE | Admit: 2019-08-11 | Discharge: 2019-08-11 | Disposition: A | Discharge status: Home / Self Care | Payer: Medicare Other | Source: Ambulatory Visit | Attending: Radiation Oncology | Admitting: Radiation Oncology

## 2019-08-11 DIAGNOSIS — Z510 Encounter for antineoplastic radiation therapy: Secondary | ICD-10-CM | POA: Diagnosis not present

## 2019-08-11 DIAGNOSIS — C61 Malignant neoplasm of prostate: Secondary | ICD-10-CM | POA: Diagnosis not present

## 2019-08-12 ENCOUNTER — Encounter: Payer: Self-pay | Admitting: Medical Oncology

## 2019-08-12 ENCOUNTER — Other Ambulatory Visit: Payer: Self-pay

## 2019-08-12 ENCOUNTER — Ambulatory Visit
Admission: RE | Admit: 2019-08-12 | Discharge: 2019-08-12 | Disposition: A | Discharge status: Home / Self Care | Payer: Medicare Other | Source: Ambulatory Visit | Attending: Radiation Oncology | Admitting: Radiation Oncology

## 2019-08-12 DIAGNOSIS — Z510 Encounter for antineoplastic radiation therapy: Secondary | ICD-10-CM | POA: Diagnosis not present

## 2019-08-12 DIAGNOSIS — C61 Malignant neoplasm of prostate: Secondary | ICD-10-CM | POA: Diagnosis not present

## 2019-08-13 ENCOUNTER — Ambulatory Visit
Admission: RE | Admit: 2019-08-13 | Discharge: 2019-08-13 | Disposition: A | Discharge status: Home / Self Care | Payer: Medicare Other | Source: Ambulatory Visit | Attending: Radiation Oncology | Admitting: Radiation Oncology

## 2019-08-13 ENCOUNTER — Other Ambulatory Visit: Payer: Self-pay

## 2019-08-13 DIAGNOSIS — C61 Malignant neoplasm of prostate: Secondary | ICD-10-CM | POA: Diagnosis not present

## 2019-08-13 DIAGNOSIS — Z510 Encounter for antineoplastic radiation therapy: Secondary | ICD-10-CM | POA: Diagnosis not present

## 2019-08-16 ENCOUNTER — Other Ambulatory Visit: Payer: Self-pay

## 2019-08-16 ENCOUNTER — Ambulatory Visit
Admission: RE | Admit: 2019-08-16 | Discharge: 2019-08-16 | Disposition: A | Discharge status: Home / Self Care | Payer: Medicare Other | Source: Ambulatory Visit | Attending: Radiation Oncology | Admitting: Radiation Oncology

## 2019-08-16 DIAGNOSIS — C61 Malignant neoplasm of prostate: Secondary | ICD-10-CM | POA: Insufficient documentation

## 2019-08-16 DIAGNOSIS — Z510 Encounter for antineoplastic radiation therapy: Secondary | ICD-10-CM | POA: Insufficient documentation

## 2019-08-17 ENCOUNTER — Other Ambulatory Visit: Payer: Self-pay

## 2019-08-17 ENCOUNTER — Ambulatory Visit
Admission: RE | Admit: 2019-08-17 | Discharge: 2019-08-17 | Disposition: A | Discharge status: Home / Self Care | Payer: Medicare Other | Source: Ambulatory Visit | Attending: Radiation Oncology | Admitting: Radiation Oncology

## 2019-08-17 DIAGNOSIS — C61 Malignant neoplasm of prostate: Secondary | ICD-10-CM | POA: Diagnosis not present

## 2019-08-17 DIAGNOSIS — Z510 Encounter for antineoplastic radiation therapy: Secondary | ICD-10-CM | POA: Diagnosis not present

## 2019-08-18 ENCOUNTER — Ambulatory Visit
Admission: RE | Admit: 2019-08-18 | Discharge: 2019-08-18 | Disposition: A | Discharge status: Home / Self Care | Payer: Medicare Other | Source: Ambulatory Visit | Attending: Radiation Oncology | Admitting: Radiation Oncology

## 2019-08-18 ENCOUNTER — Other Ambulatory Visit: Payer: Self-pay

## 2019-08-18 DIAGNOSIS — Z510 Encounter for antineoplastic radiation therapy: Secondary | ICD-10-CM | POA: Diagnosis not present

## 2019-08-18 DIAGNOSIS — C61 Malignant neoplasm of prostate: Secondary | ICD-10-CM | POA: Diagnosis not present

## 2019-08-19 ENCOUNTER — Other Ambulatory Visit: Payer: Self-pay

## 2019-08-19 ENCOUNTER — Ambulatory Visit
Admission: RE | Admit: 2019-08-19 | Discharge: 2019-08-19 | Disposition: A | Discharge status: Home / Self Care | Payer: Medicare Other | Source: Ambulatory Visit | Attending: Radiation Oncology | Admitting: Radiation Oncology

## 2019-08-19 DIAGNOSIS — Z510 Encounter for antineoplastic radiation therapy: Secondary | ICD-10-CM | POA: Diagnosis not present

## 2019-08-19 DIAGNOSIS — C61 Malignant neoplasm of prostate: Secondary | ICD-10-CM | POA: Diagnosis not present

## 2019-08-20 ENCOUNTER — Other Ambulatory Visit: Payer: Self-pay

## 2019-08-20 ENCOUNTER — Ambulatory Visit
Admission: RE | Admit: 2019-08-20 | Discharge: 2019-08-20 | Disposition: A | Discharge status: Home / Self Care | Payer: Medicare Other | Source: Ambulatory Visit | Attending: Radiation Oncology | Admitting: Radiation Oncology

## 2019-08-20 DIAGNOSIS — Z510 Encounter for antineoplastic radiation therapy: Secondary | ICD-10-CM | POA: Diagnosis not present

## 2019-08-20 DIAGNOSIS — C61 Malignant neoplasm of prostate: Secondary | ICD-10-CM | POA: Diagnosis not present

## 2019-08-23 ENCOUNTER — Ambulatory Visit
Admission: RE | Admit: 2019-08-23 | Discharge: 2019-08-23 | Disposition: A | Discharge status: Home / Self Care | Payer: Medicare Other | Source: Ambulatory Visit | Attending: Radiation Oncology | Admitting: Radiation Oncology

## 2019-08-23 ENCOUNTER — Other Ambulatory Visit: Payer: Self-pay

## 2019-08-23 DIAGNOSIS — C61 Malignant neoplasm of prostate: Secondary | ICD-10-CM | POA: Diagnosis not present

## 2019-08-23 DIAGNOSIS — Z510 Encounter for antineoplastic radiation therapy: Secondary | ICD-10-CM | POA: Diagnosis not present

## 2019-08-24 ENCOUNTER — Other Ambulatory Visit: Payer: Self-pay

## 2019-08-24 ENCOUNTER — Ambulatory Visit
Admission: RE | Admit: 2019-08-24 | Discharge: 2019-08-24 | Disposition: A | Discharge status: Home / Self Care | Payer: Medicare Other | Source: Ambulatory Visit | Attending: Radiation Oncology | Admitting: Radiation Oncology

## 2019-08-24 DIAGNOSIS — C61 Malignant neoplasm of prostate: Secondary | ICD-10-CM | POA: Diagnosis not present

## 2019-08-24 DIAGNOSIS — Z510 Encounter for antineoplastic radiation therapy: Secondary | ICD-10-CM | POA: Diagnosis not present

## 2019-08-25 ENCOUNTER — Other Ambulatory Visit: Payer: Self-pay | Admitting: Urology

## 2019-08-25 ENCOUNTER — Ambulatory Visit
Admission: RE | Admit: 2019-08-25 | Discharge: 2019-08-25 | Disposition: A | Discharge status: Home / Self Care | Payer: Medicare Other | Source: Ambulatory Visit | Attending: Radiation Oncology | Admitting: Radiation Oncology

## 2019-08-25 ENCOUNTER — Other Ambulatory Visit: Payer: Self-pay

## 2019-08-25 DIAGNOSIS — Z510 Encounter for antineoplastic radiation therapy: Secondary | ICD-10-CM | POA: Diagnosis not present

## 2019-08-25 DIAGNOSIS — C61 Malignant neoplasm of prostate: Secondary | ICD-10-CM | POA: Diagnosis not present

## 2019-08-25 MED ORDER — MIRABEGRON ER 25 MG PO TB24: 25.0000 mg | ORAL_TABLET | Freq: Every day | ORAL | 0 refills | Status: DC

## 2019-08-26 ENCOUNTER — Other Ambulatory Visit: Payer: Self-pay

## 2019-08-26 ENCOUNTER — Ambulatory Visit
Admission: RE | Admit: 2019-08-26 | Discharge: 2019-08-26 | Disposition: A | Discharge status: Home / Self Care | Payer: Medicare Other | Source: Ambulatory Visit | Attending: Radiation Oncology | Admitting: Radiation Oncology

## 2019-08-26 ENCOUNTER — Telehealth: Payer: Self-pay | Admitting: Radiation Oncology

## 2019-08-26 DIAGNOSIS — Z510 Encounter for antineoplastic radiation therapy: Secondary | ICD-10-CM | POA: Diagnosis not present

## 2019-08-26 DIAGNOSIS — C61 Malignant neoplasm of prostate: Secondary | ICD-10-CM | POA: Diagnosis not present

## 2019-08-26 NOTE — Telephone Encounter (Signed)
Phoned patient to make him aware prior authorization for Myrbetriq 25 mg has been submitted via [employer]. Encouraged patient to follow up with pharmacy in 24, 48 and/or 72 hours. Patient verbalized understanding of all reviewed. Patient understands to contact this RN with future needs.

## 2019-08-26 NOTE — Telephone Encounter (Signed)
Spoke with Truman Hayward at [facility]. Truman Hayward reports that the Myrbetriq requires a PA before they can fill it. This RN will complete PA via [email or web address]

## 2019-08-27 ENCOUNTER — Other Ambulatory Visit: Payer: Self-pay

## 2019-08-27 ENCOUNTER — Ambulatory Visit
Admission: RE | Admit: 2019-08-27 | Discharge: 2019-08-27 | Disposition: A | Discharge status: Home / Self Care | Payer: Medicare Other | Source: Ambulatory Visit | Attending: Radiation Oncology | Admitting: Radiation Oncology

## 2019-08-27 DIAGNOSIS — Z510 Encounter for antineoplastic radiation therapy: Secondary | ICD-10-CM | POA: Diagnosis not present

## 2019-08-27 DIAGNOSIS — C61 Malignant neoplasm of prostate: Secondary | ICD-10-CM | POA: Diagnosis not present

## 2019-08-30 ENCOUNTER — Ambulatory Visit
Admission: RE | Admit: 2019-08-30 | Discharge: 2019-08-30 | Disposition: A | Discharge status: Home / Self Care | Payer: Medicare Other | Source: Ambulatory Visit | Attending: Radiation Oncology | Admitting: Radiation Oncology

## 2019-08-30 ENCOUNTER — Other Ambulatory Visit: Payer: Self-pay

## 2019-08-30 DIAGNOSIS — Z510 Encounter for antineoplastic radiation therapy: Secondary | ICD-10-CM | POA: Diagnosis not present

## 2019-08-30 DIAGNOSIS — C61 Malignant neoplasm of prostate: Secondary | ICD-10-CM | POA: Diagnosis not present

## 2019-08-31 ENCOUNTER — Other Ambulatory Visit: Payer: Self-pay | Admitting: Urology

## 2019-08-31 ENCOUNTER — Ambulatory Visit
Admission: RE | Admit: 2019-08-31 | Discharge: 2019-08-31 | Disposition: A | Discharge status: Home / Self Care | Payer: Medicare Other | Source: Ambulatory Visit | Attending: Radiation Oncology | Admitting: Radiation Oncology

## 2019-08-31 ENCOUNTER — Other Ambulatory Visit: Payer: Self-pay

## 2019-08-31 DIAGNOSIS — Z510 Encounter for antineoplastic radiation therapy: Secondary | ICD-10-CM | POA: Diagnosis not present

## 2019-08-31 DIAGNOSIS — C61 Malignant neoplasm of prostate: Secondary | ICD-10-CM | POA: Diagnosis not present

## 2019-08-31 MED ORDER — TROSPIUM CHLORIDE ER 60 MG PO CP24: 60.0000 mg | ORAL_CAPSULE | Freq: Every morning | ORAL | 2 refills | Status: DC

## 2019-09-01 ENCOUNTER — Ambulatory Visit
Admission: RE | Admit: 2019-09-01 | Discharge: 2019-09-01 | Disposition: A | Discharge status: Home / Self Care | Payer: Medicare Other | Source: Ambulatory Visit | Attending: Radiation Oncology | Admitting: Radiation Oncology

## 2019-09-01 ENCOUNTER — Other Ambulatory Visit: Payer: Self-pay

## 2019-09-01 DIAGNOSIS — C61 Malignant neoplasm of prostate: Secondary | ICD-10-CM | POA: Diagnosis not present

## 2019-09-01 DIAGNOSIS — Z510 Encounter for antineoplastic radiation therapy: Secondary | ICD-10-CM | POA: Diagnosis not present

## 2019-09-02 ENCOUNTER — Other Ambulatory Visit: Payer: Self-pay | Admitting: Urology

## 2019-09-02 ENCOUNTER — Telehealth: Payer: Self-pay | Admitting: Radiation Oncology

## 2019-09-02 ENCOUNTER — Ambulatory Visit
Admission: RE | Admit: 2019-09-02 | Discharge: 2019-09-02 | Disposition: A | Discharge status: Home / Self Care | Payer: Medicare Other | Source: Ambulatory Visit | Attending: Radiation Oncology | Admitting: Radiation Oncology

## 2019-09-02 ENCOUNTER — Other Ambulatory Visit: Payer: Self-pay

## 2019-09-02 DIAGNOSIS — Z510 Encounter for antineoplastic radiation therapy: Secondary | ICD-10-CM | POA: Diagnosis not present

## 2019-09-02 DIAGNOSIS — C61 Malignant neoplasm of prostate: Secondary | ICD-10-CM | POA: Diagnosis not present

## 2019-09-02 MED ORDER — TROSPIUM CHLORIDE 20 MG PO TABS: 20.0000 mg | ORAL_TABLET | Freq: Two times a day (BID) | ORAL | 1 refills | Status: DC

## 2019-09-02 NOTE — Telephone Encounter (Signed)
Phoned patient explained trospium 20mg  po BID has been electronically prescribed to his preferred pharmacy. Encouraged patient to phone this RN back if any difficulty should arise. Patient verbalized understanding and appreciation for the call.

## 2019-09-02 NOTE — Telephone Encounter (Signed)
-----   Message from Freeman Caldron, [place] sent at 09/02/2019  2:48 PM EDT ----- Regarding: RE: T[employer]are/Prior Authorization Good heavens! Maybe third time is the charm!  I sent Rx for trospium 20mg  po BID to his pharmacy. Lia Foyer ----- Message ----- From: Heywood Footman, RN Sent: 09/02/2019   1:35 PM EDT To: Freeman Caldron, PA-C Subject: Tricare/Prior Authorization                    Ashlyn.   Mr. Pullman has received 17/38 fractions to his prostate bed totaling 30.6 of 68.4 intended Gy. He is really struggling to get his bladder empty. Originally you prescribed Myrbetriq. I did a PA and tricare refused to pay. While I was out you sent in trospium 60mg . I did a PA and tric[employer] refused it too. Below is what they are willing to cover. Also, the patient says they have covered Flomax for him in the past.   Oxybutynin 5 mg tablet Detrol LA 4 mg capsule Oxybutynin chloride ER tablets 5, 10 or 15 mg  Trospium chloride 20 mg  Tolterodine ER cap 2 mg  Sam

## 2019-09-03 ENCOUNTER — Other Ambulatory Visit: Payer: Self-pay

## 2019-09-03 ENCOUNTER — Ambulatory Visit
Admission: RE | Admit: 2019-09-03 | Discharge: 2019-09-03 | Disposition: A | Discharge status: Home / Self Care | Payer: Medicare Other | Source: Ambulatory Visit | Attending: Radiation Oncology | Admitting: Radiation Oncology

## 2019-09-03 DIAGNOSIS — C61 Malignant neoplasm of prostate: Secondary | ICD-10-CM | POA: Diagnosis not present

## 2019-09-03 DIAGNOSIS — Z510 Encounter for antineoplastic radiation therapy: Secondary | ICD-10-CM | POA: Diagnosis not present

## 2019-09-06 ENCOUNTER — Ambulatory Visit
Admission: RE | Admit: 2019-09-06 | Discharge: 2019-09-06 | Disposition: A | Discharge status: Home / Self Care | Payer: Medicare Other | Source: Ambulatory Visit | Attending: Radiation Oncology | Admitting: Radiation Oncology

## 2019-09-06 ENCOUNTER — Other Ambulatory Visit: Payer: Self-pay

## 2019-09-06 DIAGNOSIS — Z510 Encounter for antineoplastic radiation therapy: Secondary | ICD-10-CM | POA: Diagnosis not present

## 2019-09-06 DIAGNOSIS — C61 Malignant neoplasm of prostate: Secondary | ICD-10-CM | POA: Diagnosis not present

## 2019-09-07 ENCOUNTER — Other Ambulatory Visit: Payer: Self-pay

## 2019-09-07 ENCOUNTER — Ambulatory Visit
Admission: RE | Admit: 2019-09-07 | Discharge: 2019-09-07 | Disposition: A | Discharge status: Home / Self Care | Payer: Medicare Other | Source: Ambulatory Visit | Attending: Radiation Oncology | Admitting: Radiation Oncology

## 2019-09-07 DIAGNOSIS — C61 Malignant neoplasm of prostate: Secondary | ICD-10-CM | POA: Diagnosis not present

## 2019-09-07 DIAGNOSIS — Z510 Encounter for antineoplastic radiation therapy: Secondary | ICD-10-CM | POA: Diagnosis not present

## 2019-09-08 ENCOUNTER — Ambulatory Visit
Admission: RE | Admit: 2019-09-08 | Discharge: 2019-09-08 | Disposition: A | Discharge status: Home / Self Care | Payer: Medicare Other | Source: Ambulatory Visit | Attending: Radiation Oncology | Admitting: Radiation Oncology

## 2019-09-08 ENCOUNTER — Other Ambulatory Visit: Payer: Self-pay

## 2019-09-08 DIAGNOSIS — C61 Malignant neoplasm of prostate: Secondary | ICD-10-CM | POA: Diagnosis not present

## 2019-09-08 DIAGNOSIS — Z510 Encounter for antineoplastic radiation therapy: Secondary | ICD-10-CM | POA: Diagnosis not present

## 2019-09-09 ENCOUNTER — Other Ambulatory Visit: Payer: Self-pay | Admitting: Urology

## 2019-09-09 ENCOUNTER — Ambulatory Visit
Admission: RE | Admit: 2019-09-09 | Discharge: 2019-09-09 | Disposition: A | Discharge status: Home / Self Care | Payer: Medicare Other | Source: Ambulatory Visit | Attending: Radiation Oncology | Admitting: Radiation Oncology

## 2019-09-09 ENCOUNTER — Other Ambulatory Visit: Payer: Self-pay

## 2019-09-09 DIAGNOSIS — C61 Malignant neoplasm of prostate: Secondary | ICD-10-CM | POA: Diagnosis not present

## 2019-09-09 DIAGNOSIS — Z510 Encounter for antineoplastic radiation therapy: Secondary | ICD-10-CM | POA: Diagnosis not present

## 2019-09-10 ENCOUNTER — Other Ambulatory Visit: Payer: Self-pay

## 2019-09-10 ENCOUNTER — Ambulatory Visit
Admission: RE | Admit: 2019-09-10 | Discharge: 2019-09-10 | Disposition: A | Discharge status: Home / Self Care | Payer: Medicare Other | Source: Ambulatory Visit | Attending: Radiation Oncology | Admitting: Radiation Oncology

## 2019-09-10 DIAGNOSIS — Z510 Encounter for antineoplastic radiation therapy: Secondary | ICD-10-CM | POA: Diagnosis not present

## 2019-09-10 DIAGNOSIS — C61 Malignant neoplasm of prostate: Secondary | ICD-10-CM | POA: Diagnosis not present

## 2019-09-13 ENCOUNTER — Ambulatory Visit
Admission: RE | Admit: 2019-09-13 | Discharge: 2019-09-13 | Disposition: A | Discharge status: Home / Self Care | Payer: Medicare Other | Source: Ambulatory Visit | Attending: Radiation Oncology | Admitting: Radiation Oncology

## 2019-09-13 ENCOUNTER — Other Ambulatory Visit: Payer: Self-pay

## 2019-09-13 DIAGNOSIS — Z510 Encounter for antineoplastic radiation therapy: Secondary | ICD-10-CM | POA: Diagnosis not present

## 2019-09-13 DIAGNOSIS — C61 Malignant neoplasm of prostate: Secondary | ICD-10-CM | POA: Diagnosis not present

## 2019-09-14 ENCOUNTER — Ambulatory Visit
Admission: RE | Admit: 2019-09-14 | Discharge: 2019-09-14 | Disposition: A | Discharge status: Home / Self Care | Payer: Medicare Other | Source: Ambulatory Visit | Attending: Radiation Oncology | Admitting: Radiation Oncology

## 2019-09-14 ENCOUNTER — Other Ambulatory Visit: Payer: Self-pay

## 2019-09-14 DIAGNOSIS — Z510 Encounter for antineoplastic radiation therapy: Secondary | ICD-10-CM | POA: Diagnosis not present

## 2019-09-14 DIAGNOSIS — C61 Malignant neoplasm of prostate: Secondary | ICD-10-CM | POA: Diagnosis not present

## 2019-09-15 ENCOUNTER — Ambulatory Visit
Admission: RE | Admit: 2019-09-15 | Discharge: 2019-09-15 | Disposition: A | Discharge status: Home / Self Care | Payer: Medicare Other | Source: Ambulatory Visit | Attending: Radiation Oncology | Admitting: Radiation Oncology

## 2019-09-15 DIAGNOSIS — C61 Malignant neoplasm of prostate: Secondary | ICD-10-CM | POA: Insufficient documentation

## 2019-09-15 DIAGNOSIS — Z510 Encounter for antineoplastic radiation therapy: Secondary | ICD-10-CM | POA: Diagnosis not present

## 2019-09-16 ENCOUNTER — Ambulatory Visit
Admission: RE | Admit: 2019-09-16 | Discharge: 2019-09-16 | Disposition: A | Discharge status: Home / Self Care | Payer: Medicare Other | Source: Ambulatory Visit | Attending: Radiation Oncology | Admitting: Radiation Oncology

## 2019-09-16 ENCOUNTER — Other Ambulatory Visit: Payer: Self-pay

## 2019-09-16 DIAGNOSIS — Z510 Encounter for antineoplastic radiation therapy: Secondary | ICD-10-CM | POA: Diagnosis not present

## 2019-09-16 DIAGNOSIS — C61 Malignant neoplasm of prostate: Secondary | ICD-10-CM | POA: Diagnosis not present

## 2019-09-17 ENCOUNTER — Ambulatory Visit
Admission: RE | Admit: 2019-09-17 | Discharge: 2019-09-17 | Disposition: A | Discharge status: Home / Self Care | Payer: Medicare Other | Source: Ambulatory Visit | Attending: Radiation Oncology | Admitting: Radiation Oncology

## 2019-09-17 ENCOUNTER — Other Ambulatory Visit: Payer: Self-pay

## 2019-09-17 DIAGNOSIS — Z510 Encounter for antineoplastic radiation therapy: Secondary | ICD-10-CM | POA: Diagnosis not present

## 2019-09-17 DIAGNOSIS — C61 Malignant neoplasm of prostate: Secondary | ICD-10-CM | POA: Diagnosis not present

## 2019-09-21 ENCOUNTER — Other Ambulatory Visit: Payer: Self-pay

## 2019-09-21 ENCOUNTER — Ambulatory Visit
Admission: RE | Admit: 2019-09-21 | Discharge: 2019-09-21 | Disposition: A | Discharge status: Home / Self Care | Payer: Medicare Other | Source: Ambulatory Visit | Attending: Radiation Oncology | Admitting: Radiation Oncology

## 2019-09-21 DIAGNOSIS — C61 Malignant neoplasm of prostate: Secondary | ICD-10-CM | POA: Diagnosis not present

## 2019-09-21 DIAGNOSIS — Z510 Encounter for antineoplastic radiation therapy: Secondary | ICD-10-CM | POA: Diagnosis not present

## 2019-09-22 ENCOUNTER — Other Ambulatory Visit: Payer: Self-pay

## 2019-09-22 ENCOUNTER — Ambulatory Visit
Admission: RE | Admit: 2019-09-22 | Discharge: 2019-09-22 | Disposition: A | Discharge status: Home / Self Care | Payer: Medicare Other | Source: Ambulatory Visit | Attending: Radiation Oncology | Admitting: Radiation Oncology

## 2019-09-22 DIAGNOSIS — Z510 Encounter for antineoplastic radiation therapy: Secondary | ICD-10-CM | POA: Diagnosis not present

## 2019-09-22 DIAGNOSIS — C61 Malignant neoplasm of prostate: Secondary | ICD-10-CM | POA: Diagnosis not present

## 2019-09-23 ENCOUNTER — Telehealth: Payer: Self-pay | Admitting: Radiation Oncology

## 2019-09-23 ENCOUNTER — Ambulatory Visit
Admission: RE | Admit: 2019-09-23 | Discharge: 2019-09-23 | Disposition: A | Discharge status: Home / Self Care | Payer: Medicare Other | Source: Ambulatory Visit | Attending: Radiation Oncology | Admitting: Radiation Oncology

## 2019-09-23 ENCOUNTER — Other Ambulatory Visit: Payer: Self-pay

## 2019-09-23 DIAGNOSIS — C61 Malignant neoplasm of prostate: Secondary | ICD-10-CM | POA: Diagnosis not present

## 2019-09-23 DIAGNOSIS — Z510 Encounter for antineoplastic radiation therapy: Secondary | ICD-10-CM | POA: Diagnosis not present

## 2019-09-23 NOTE — Telephone Encounter (Signed)
Fax completed and signed [place] form to [facility]. Fax confirmation of delivery obtained.

## 2019-09-24 ENCOUNTER — Ambulatory Visit
Admission: RE | Admit: 2019-09-24 | Discharge: 2019-09-24 | Disposition: A | Discharge status: Home / Self Care | Payer: Medicare Other | Source: Ambulatory Visit | Attending: Radiation Oncology | Admitting: Radiation Oncology

## 2019-09-24 ENCOUNTER — Other Ambulatory Visit: Payer: Self-pay

## 2019-09-24 ENCOUNTER — Ambulatory Visit (INDEPENDENT_AMBULATORY_CARE_PROVIDER_SITE_OTHER): Payer: Medicare Other | Admitting: Podiatry

## 2019-09-24 ENCOUNTER — Encounter: Payer: Self-pay | Admitting: Podiatry

## 2019-09-24 DIAGNOSIS — C61 Malignant neoplasm of prostate: Secondary | ICD-10-CM | POA: Diagnosis not present

## 2019-09-24 DIAGNOSIS — M79674 Pain in right toe(s): Secondary | ICD-10-CM | POA: Diagnosis not present

## 2019-09-24 DIAGNOSIS — E11610 Type 2 diabetes mellitus with diabetic neuropathic arthropathy: Secondary | ICD-10-CM | POA: Diagnosis not present

## 2019-09-24 DIAGNOSIS — D689 Coagulation defect, unspecified: Secondary | ICD-10-CM | POA: Diagnosis not present

## 2019-09-24 DIAGNOSIS — B351 Tinea unguium: Secondary | ICD-10-CM | POA: Diagnosis not present

## 2019-09-24 DIAGNOSIS — M79675 Pain in left toe(s): Secondary | ICD-10-CM | POA: Diagnosis not present

## 2019-09-24 DIAGNOSIS — Z510 Encounter for antineoplastic radiation therapy: Secondary | ICD-10-CM | POA: Diagnosis not present

## 2019-09-24 NOTE — Progress Notes (Signed)
This patient returns to my office for at risk foot care.  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  This patient is unable to cut nails himself since the patient cannot reach his nails.These nails are painful walking and wearing shoes.  This patient presents for at risk foot care today.  General Appearance  Alert, conversant and in no acute stress.  Vascular  Dorsalis pedis and posterior tibial  pulses are palpable  bilaterally.  Capillary return is within normal limits  bilaterally. Temperature is within normal limits  bilaterally.  Neurologic  Senn-Weinstein monofilament wire test within normal limits  bilaterally. Muscle power within normal limits bilaterally.  Nails Thick disfigured discolored nails with subungual debris  from hallux to fifth toes bilaterally. No evidence of bacterial infection or drainage bilaterally.  Orthopedic  No limitations of motion  feet .  No crepitus or effusions noted.  No bony pathology or digital deformities noted.  Asymptomatic  HAV.  Limited ROM STJ right foot.  Skin  normotropic skin with no porokeratosis noted bilaterally.  No signs of infections or ulcers noted.     Onychomycosis  Pain in right toes  Pain in left toes  Consent was obtained for treatment procedures.   Mechanical debridement of nails 1-5  bilaterally performed with a nail nipper.  Filed with dremel without incident.    Return office visit    3 months                  Told patient to return for periodic foot care and evaluation due to potential at risk complications.   Cavon Esly Selvage DPM  

## 2019-09-27 ENCOUNTER — Ambulatory Visit
Admission: RE | Admit: 2019-09-27 | Discharge: 2019-09-27 | Disposition: A | Discharge status: Home / Self Care | Payer: Medicare Other | Source: Ambulatory Visit | Attending: Radiation Oncology | Admitting: Radiation Oncology

## 2019-09-27 ENCOUNTER — Other Ambulatory Visit: Payer: Self-pay

## 2019-09-27 DIAGNOSIS — C61 Malignant neoplasm of prostate: Secondary | ICD-10-CM | POA: Diagnosis not present

## 2019-09-27 DIAGNOSIS — Z510 Encounter for antineoplastic radiation therapy: Secondary | ICD-10-CM | POA: Diagnosis not present

## 2019-09-28 ENCOUNTER — Ambulatory Visit
Admission: RE | Admit: 2019-09-28 | Discharge: 2019-09-28 | Disposition: A | Discharge status: Home / Self Care | Payer: Medicare Other | Source: Ambulatory Visit | Attending: Radiation Oncology | Admitting: Radiation Oncology

## 2019-09-28 ENCOUNTER — Other Ambulatory Visit: Payer: Self-pay

## 2019-09-28 DIAGNOSIS — Z510 Encounter for antineoplastic radiation therapy: Secondary | ICD-10-CM | POA: Diagnosis not present

## 2019-09-28 DIAGNOSIS — C61 Malignant neoplasm of prostate: Secondary | ICD-10-CM | POA: Diagnosis not present

## 2019-09-29 ENCOUNTER — Ambulatory Visit
Admission: RE | Admit: 2019-09-29 | Discharge: 2019-09-29 | Disposition: A | Discharge status: Home / Self Care | Payer: Medicare Other | Source: Ambulatory Visit | Attending: Radiation Oncology | Admitting: Radiation Oncology

## 2019-09-29 ENCOUNTER — Other Ambulatory Visit: Payer: Self-pay

## 2019-09-29 DIAGNOSIS — Z510 Encounter for antineoplastic radiation therapy: Secondary | ICD-10-CM | POA: Diagnosis not present

## 2019-09-29 DIAGNOSIS — C61 Malignant neoplasm of prostate: Secondary | ICD-10-CM | POA: Diagnosis not present

## 2019-09-30 ENCOUNTER — Other Ambulatory Visit: Payer: Self-pay

## 2019-09-30 ENCOUNTER — Ambulatory Visit
Admission: RE | Admit: 2019-09-30 | Discharge: 2019-09-30 | Disposition: A | Discharge status: Home / Self Care | Payer: Medicare Other | Source: Ambulatory Visit | Attending: Radiation Oncology | Admitting: Radiation Oncology

## 2019-09-30 DIAGNOSIS — Z510 Encounter for antineoplastic radiation therapy: Secondary | ICD-10-CM | POA: Diagnosis not present

## 2019-09-30 DIAGNOSIS — C61 Malignant neoplasm of prostate: Secondary | ICD-10-CM | POA: Diagnosis not present

## 2019-10-01 ENCOUNTER — Ambulatory Visit
Admission: RE | Admit: 2019-10-01 | Discharge: 2019-10-01 | Disposition: A | Discharge status: Home / Self Care | Payer: Medicare Other | Source: Ambulatory Visit | Attending: Radiation Oncology | Admitting: Radiation Oncology

## 2019-10-01 ENCOUNTER — Other Ambulatory Visit: Payer: Self-pay

## 2019-10-01 DIAGNOSIS — Z510 Encounter for antineoplastic radiation therapy: Secondary | ICD-10-CM | POA: Diagnosis not present

## 2019-10-01 DIAGNOSIS — C61 Malignant neoplasm of prostate: Secondary | ICD-10-CM | POA: Diagnosis not present

## 2019-10-04 ENCOUNTER — Encounter: Payer: Self-pay | Admitting: Urology

## 2019-10-04 ENCOUNTER — Encounter: Payer: Self-pay | Admitting: Medical Oncology

## 2019-10-04 ENCOUNTER — Ambulatory Visit
Admission: RE | Admit: 2019-10-04 | Discharge: 2019-10-04 | Disposition: A | Discharge status: Home / Self Care | Payer: Medicare Other | Source: Ambulatory Visit | Attending: Radiation Oncology | Admitting: Radiation Oncology

## 2019-10-04 DIAGNOSIS — Z510 Encounter for antineoplastic radiation therapy: Secondary | ICD-10-CM | POA: Diagnosis not present

## 2019-10-04 DIAGNOSIS — C61 Malignant neoplasm of prostate: Secondary | ICD-10-CM | POA: Diagnosis not present

## 2019-10-05 DIAGNOSIS — C61 Malignant neoplasm of prostate: Secondary | ICD-10-CM | POA: Diagnosis not present

## 2019-10-05 DIAGNOSIS — N5201 Erectile dysfunction due to arterial insufficiency: Secondary | ICD-10-CM | POA: Diagnosis not present

## 2019-10-05 DIAGNOSIS — N393 Stress incontinence (female) (male): Secondary | ICD-10-CM | POA: Diagnosis not present

## 2019-10-19 DIAGNOSIS — E1140 Type 2 diabetes mellitus with diabetic neuropathy, unspecified: Secondary | ICD-10-CM | POA: Diagnosis not present

## 2019-10-19 DIAGNOSIS — I739 Peripheral vascular disease, unspecified: Secondary | ICD-10-CM | POA: Diagnosis not present

## 2019-10-19 DIAGNOSIS — I10 Essential (primary) hypertension: Secondary | ICD-10-CM | POA: Diagnosis not present

## 2019-10-19 DIAGNOSIS — R2681 Unsteadiness on feet: Secondary | ICD-10-CM | POA: Diagnosis not present

## 2019-10-19 DIAGNOSIS — C61 Malignant neoplasm of prostate: Secondary | ICD-10-CM | POA: Diagnosis not present

## 2019-10-19 DIAGNOSIS — E785 Hyperlipidemia, unspecified: Secondary | ICD-10-CM | POA: Diagnosis not present

## 2019-10-19 DIAGNOSIS — E669 Obesity, unspecified: Secondary | ICD-10-CM | POA: Diagnosis not present

## 2019-10-26 ENCOUNTER — Other Ambulatory Visit: Payer: Self-pay

## 2019-10-26 ENCOUNTER — Encounter: Payer: Self-pay | Admitting: Urology

## 2019-10-26 NOTE — Progress Notes (Signed)
Meaningful use is done. Patient is aware that this is a phone visit . Patient states that he does not have a scheduled appointment with the urologists ,but states that he last seen the urologist a month ago.Patient denies any urgency,hematuria, or dysuria. Patient reports a strong stream. Patient states that he has some urgency with his bowels. States that he has to use the rest room 30 minutes after his meals. States that if he takes imodium he get constipation.Patient reports nocturia x3-4. Patients IPPS score is 5.

## 2019-10-27 ENCOUNTER — Telehealth: Payer: Self-pay | Admitting: Radiation Oncology

## 2019-10-27 DIAGNOSIS — E1121 Type 2 diabetes mellitus with diabetic nephropathy: Secondary | ICD-10-CM | POA: Diagnosis not present

## 2019-10-27 DIAGNOSIS — I10 Essential (primary) hypertension: Secondary | ICD-10-CM | POA: Diagnosis not present

## 2019-10-27 NOTE — Telephone Encounter (Signed)
Received voicemail message from patient confused if his follow up with Ashlyn Bruning, PA-C is in person or over the phone. Phoned patient back. Patient explained he received an automated message that created confusion. Explained his appointment will be over the phone. Patient verbalized understanding.   Patient goes onto explain he started taking Ozempic today and request this be added to his medication list. Patient goes onto report nocturia x 3-4 that is "more frequent than I would like." Also, patient reports frequent loose bowel movements. Explained this RN will share this information with Ashlyn Bruning, PA-C.   Patient verbalizes appreciation for the return call.

## 2019-10-27 NOTE — Progress Notes (Signed)
Radiation Oncology         [phone] ________________________________  Name: Raymond Herring MRN: [id number]  Date: 10/28/2019  DOB: 28-Sep-1950  Post Treatment Note  CC: Leanna Battles, MD  Alexis Frock, MD  Diagnosis:   69 y.o. gentleman with biochemical recurrence of Stage pT3a, Gleason score of 4+3 adenocarcinoma of the prostate s/p RALP in 06/2018  Interval Since Last Radiation:  3.5 weeks  08/11/19 - 10/04/19: The prostatic fossa was treated to 68.4 Gy in 38 fractions of 1.8 Gy  Narrative: I spoke with the patient to conduct his routine scheduled 1 month follow up visit via telephone to spare the patient unnecessary potential exposure in the healthcare setting during the current COVID-19 pandemic.  The patient was notified in advance and gave permission to proceed with this visit format.  He tolerated radiation treatment relatively well with only mild urinary symptoms with increased urgency, nocturia x3 and daytime frequency which was improved with the addition of Sanctura 60mg  po BID. He specifically denied dysuria, gross hematuria, straining to void or incontinence. He did report modest fatigue.                              On review of systems, the patient states that he is doing very well in general.  He reports some mild persistent nocturia as well as frequency and urgency but these remain improved with Sanctura.  He specifically denies dysuria, gross hematuria, weak stream, straining or incontinence.  He does continue with some increased frequency and urgency with bowel movements but denies diarrhea or abdominal pain.  He reports a healthy appetite and is maintaining his weight.  He denies any significant change in his energy level and is overall quite pleased with his progress to date. PSA was 0.031 on 09/29/19, prior to his most recent urology visit.  ALLERGIES:  has No Known Allergies.  Meds: Current Outpatient Medications  Medication Sig Dispense Refill  .  acetaminophen (TYLENOL) 500 MG tablet 650 mg. Patient states that he takes 650mg  4 times per day    . allopurinol (ZYLOPRIM) 300 MG tablet Take 300 mg by mouth daily.  12  . amoxicillin (AMOXIL) 500 MG tablet amoxicillin 500 mg tablet  TAKE FOUR TS PO 1 HOUR B DAPP    . Ascorbic Acid (VITAMIN C PO) Take 3,000 mg by mouth daily.    . celecoxib (CELEBREX) 200 MG capsule Take 200 mg by mouth 2 (two) times daily.    . cephALEXin (KEFLEX) 500 MG capsule Take 2,000 mg by mouth See admin instructions. Take 2000 mg 1 hour prior to dental work    . Cholecalciferol (DIALYVITE VITAMIN D 5000) 125 MCG (5000 UT) capsule Take 5,000 Units by mouth daily.    . cilostazol (PLETAL) 100 MG tablet Take 100 mg by mouth daily.    . clobetasol (OLUX) 0.05 % topical foam Apply 1 application topically daily as needed (irritation).     . colchicine (COLCRYS) 0.6 MG tablet Colcrys 0.6 mg tablet   0.6 mg by oral route.    . Cyanocobalamin (B-12) 3000 MCG CAPS Take 3,000 mcg by mouth daily.    Marland Kitchen gabapentin (NEURONTIN) 300 MG capsule Take 300-600 mg by mouth See admin instructions. Take 300 mg in the morning and 300 mg at night, may increase night dose to 600 mg as needed for pain    . Hypromellose 0.3 % SOLN Place 1-2 drops into both eyes  3 (three) times daily as needed (for dry eyes). RETAINE [facility] 0.3%    . losartan (COZAAR) 100 MG tablet Take 100 mg by mouth daily.    . metFORMIN (GLUCOPHAGE) 500 MG tablet Take 500 mg by mouth 2 (two) times daily.     . Multiple Vitamin (MULTIVITAMIN WITH MINERALS) TABS tablet Take 1 tablet by mouth daily.    . ONE TOUCH ULTRA TEST test strip     . pravastatin (PRAVACHOL) 20 MG tablet Take 20 mg by mouth daily.    Marland Kitchen saccharomyces boulardii (FLORASTOR) 250 MG capsule Take 250 mg by mouth 2 (two) times daily.    . trospium (SANCTURA) 20 MG tablet Take 1 tablet (20 mg total) by mouth 2 (two) times daily. 60 tablet 1  . Wheat Dextrin (BENEFIBER DRINK MIX PO) Take 1 Dose by mouth daily.       Marland Kitchen zinc gluconate 50 MG tablet Take 50 mg by mouth daily.    Marland Kitchen zolpidem (AMBIEN) 10 MG tablet Take 10 mg by mouth at bedtime as needed for sleep.      No current facility-administered medications for this encounter.    Physical Findings:  vitals were not taken for this visit.   /10 Unable to assess due to telephone follow-up visit format.  Lab Findings: Lab Results  Component Value Date   WBC 6.0 02/01/2019   HGB 11.2 (L) 02/02/2019   HCT 33.0 (L) 02/02/2019   MCV 98.7 02/01/2019   PLT 243 02/01/2019     Radiographic Findings: No results found.  Impression/Plan: 1. 69 y.o. gentleman with biochemical recurrance of Stage pT3a, Gleason score of 4+3 adenocarcinoma of the prostate s/p RALP in 06/2018. He will continue to follow up with urology for ongoing PSA determinations and had an appointment with Dr. Tresa Moore on 10/05/2019.  His next follow-up visit is anticipated in approximately 6 months.  He understands what to expect with regards to PSA monitoring going forward. I will look forward to following his response to treatment via correspondence with urology, and would be happy to continue to participate in his care if clinically indicated. I talked to the patient about what to expect in the future, including his risk for erectile dysfunction and rectal bleeding. I encouraged him to call or return to the office if he has any questions regarding his previous radiation or possible radiation side effects. He was comfortable with this plan and will follow up as needed.    Nicholos Johns, PA-C

## 2019-10-27 NOTE — Progress Notes (Signed)
  Radiation Oncology         [phone]) [phone] ________________________________  Name: Raymond Herring MRN: [id number]  Date: 10/04/2019  DOB: May 20, 1950  End of Treatment Note  Diagnosis:   69 y.o. gentleman with biochemical recurrance of Stage pT3a, Gleason score of 4+3 adenocarcinoma of the prostate s/p RALP in 06/2018     Indication for treatment:  Curative, Prostatic Fossa Radiotherapy       Radiation treatment dates:   08/11/19 - 10/04/19  Site/dose:   The prostatic fossa was treated to 68.4 Gy in 38 fractions of 1.8 Gy  Beams/energy:   The prostatic fossa was treated using VMAT intensity modulated radiotherapy delivering 6 megavolt photons. Image guidance was performed with CB-CT studies prior to each fraction. He was immobilized with a body fix lower extremity mold.  Narrative: The patient tolerated radiation treatment relatively well with only mild urinary symptoms with increased urgency, nocturia x3 and daytime frequency which was improved with the addition of Sanctura 60mg  po BID. He specifically denied dysuria, gross hematuria, straining to void or incontinence. He did report modest fatigue.  Plan: The patient has completed radiation treatment. He will return to radiation oncology clinic for routine followup in one month. I advised him to call or return sooner if he has any questions or concerns related to his recovery or treatment. ________________________________  Sheral Apley. Tammi Klippel, M.D.

## 2019-10-28 ENCOUNTER — Other Ambulatory Visit: Payer: Self-pay

## 2019-10-28 ENCOUNTER — Ambulatory Visit
Admission: RE | Admit: 2019-10-28 | Discharge: 2019-10-28 | Disposition: A | Discharge status: Home / Self Care | Payer: Medicare Other | Source: Ambulatory Visit | Attending: Urology | Admitting: Urology

## 2019-10-28 DIAGNOSIS — C61 Malignant neoplasm of prostate: Secondary | ICD-10-CM

## 2019-12-21 DIAGNOSIS — H2511 Age-related nuclear cataract, right eye: Secondary | ICD-10-CM | POA: Diagnosis not present

## 2019-12-21 DIAGNOSIS — H25013 Cortical age-related cataract, bilateral: Secondary | ICD-10-CM | POA: Diagnosis not present

## 2019-12-21 DIAGNOSIS — H2513 Age-related nuclear cataract, bilateral: Secondary | ICD-10-CM | POA: Diagnosis not present

## 2019-12-21 DIAGNOSIS — E119 Type 2 diabetes mellitus without complications: Secondary | ICD-10-CM | POA: Diagnosis not present

## 2019-12-21 DIAGNOSIS — H40013 Open angle with borderline findings, low risk, bilateral: Secondary | ICD-10-CM | POA: Diagnosis not present

## 2019-12-24 ENCOUNTER — Encounter: Payer: Self-pay | Admitting: Podiatry

## 2019-12-24 ENCOUNTER — Ambulatory Visit (INDEPENDENT_AMBULATORY_CARE_PROVIDER_SITE_OTHER): Payer: Medicare Other | Admitting: Podiatry

## 2019-12-24 ENCOUNTER — Other Ambulatory Visit: Payer: Self-pay

## 2019-12-24 DIAGNOSIS — M79674 Pain in right toe(s): Secondary | ICD-10-CM | POA: Diagnosis not present

## 2019-12-24 DIAGNOSIS — M79675 Pain in left toe(s): Secondary | ICD-10-CM | POA: Diagnosis not present

## 2019-12-24 DIAGNOSIS — D689 Coagulation defect, unspecified: Secondary | ICD-10-CM

## 2019-12-24 DIAGNOSIS — B351 Tinea unguium: Secondary | ICD-10-CM

## 2019-12-24 NOTE — Progress Notes (Signed)
This patient returns to my office for at risk foot care.  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  This patient is unable to cut nails himself since the patient cannot reach his nails.These nails are painful walking and wearing shoes.  This patient presents for at risk foot care today.  General Appearance  Alert, conversant and in no acute stress.  Vascular  Dorsalis pedis and posterior tibial  pulses are palpable  bilaterally.  Capillary return is within normal limits  bilaterally. Temperature is within normal limits  bilaterally.  Neurologic  Senn-Weinstein monofilament wire test within normal limits  bilaterally. Muscle power within normal limits bilaterally.  Nails Thick disfigured discolored nails with subungual debris  from hallux to fifth toes bilaterally. No evidence of bacterial infection or drainage bilaterally.  Orthopedic  No limitations of motion  feet .  No crepitus or effusions noted.  No bony pathology or digital deformities noted.  Asymptomatic  HAV.  Limited ROM STJ right foot.  Skin  normotropic skin with no porokeratosis noted bilaterally.  No signs of infections or ulcers noted.     Onychomycosis  Pain in right toes  Pain in left toes  Consent was obtained for treatment procedures.   Mechanical debridement of nails 1-5  bilaterally performed with a nail nipper.  Filed with dremel without incident.    Return office visit    3 months                  Told patient to return for periodic foot care and evaluation due to potential at risk complications.   Sheila Cashay Manganelli DPM  

## 2020-01-09 DIAGNOSIS — U071 COVID-19: Secondary | ICD-10-CM | POA: Diagnosis not present

## 2020-01-09 DIAGNOSIS — Z1152 Encounter for screening for COVID-19: Secondary | ICD-10-CM | POA: Diagnosis not present

## 2020-01-10 ENCOUNTER — Telehealth: Payer: Self-pay | Admitting: Unknown Physician Specialty

## 2020-01-10 ENCOUNTER — Other Ambulatory Visit: Payer: Self-pay | Admitting: Unknown Physician Specialty

## 2020-01-10 ENCOUNTER — Encounter: Payer: Self-pay | Admitting: Unknown Physician Specialty

## 2020-01-10 ENCOUNTER — Ambulatory Visit (HOSPITAL_COMMUNITY)
Admission: RE | Admit: 2020-01-10 | Discharge: 2020-01-10 | Disposition: A | Discharge status: Home / Self Care | Payer: Medicare Other | Source: Ambulatory Visit | Attending: Pulmonary Disease | Admitting: Pulmonary Disease

## 2020-01-10 DIAGNOSIS — U071 COVID-19: Secondary | ICD-10-CM

## 2020-01-10 DIAGNOSIS — Z23 Encounter for immunization: Secondary | ICD-10-CM | POA: Diagnosis not present

## 2020-01-10 DIAGNOSIS — I10 Essential (primary) hypertension: Secondary | ICD-10-CM

## 2020-01-10 DIAGNOSIS — E1151 Type 2 diabetes mellitus with diabetic peripheral angiopathy without gangrene: Secondary | ICD-10-CM | POA: Diagnosis not present

## 2020-01-10 MED ORDER — ALBUTEROL SULFATE HFA 108 (90 BASE) MCG/ACT IN AERS: 2.0000 | INHALATION_SPRAY | Freq: Once | RESPIRATORY_TRACT | Status: DC | PRN

## 2020-01-10 MED ORDER — SODIUM CHLORIDE 0.9 % IV SOLN: Freq: Once | INTRAVENOUS | Status: AC

## 2020-01-10 MED ORDER — DIPHENHYDRAMINE HCL 50 MG/ML IJ SOLN: 50.0000 mg | Freq: Once | INTRAMUSCULAR | Status: DC | PRN

## 2020-01-10 MED ORDER — SODIUM CHLORIDE 0.9 % IV SOLN: INTRAVENOUS | Status: DC | PRN

## 2020-01-10 MED ORDER — FAMOTIDINE IN NACL 20-0.9 MG/50ML-% IV SOLN: 20.0000 mg | Freq: Once | INTRAVENOUS | Status: DC | PRN

## 2020-01-10 MED ORDER — METHYLPREDNISOLONE SODIUM SUCC 125 MG IJ SOLR: 125.0000 mg | Freq: Once | INTRAMUSCULAR | Status: DC | PRN

## 2020-01-10 MED ORDER — EPINEPHRINE 0.3 MG/0.3ML IJ SOAJ: 0.3000 mg | Freq: Once | INTRAMUSCULAR | Status: DC | PRN

## 2020-01-10 NOTE — Progress Notes (Addendum)
  Diagnosis: COVID-19  Physician:  Patrick Wright  Procedure: [facility] Infusion [facility] Med: casirivimab\imdevimab infusion - Provided patient with casirivimab\imdevimab fact sheet for patients, parents and caregivers prior to infusion.  Complications: No immediate complications noted.  Discharge: Discharged home   Kelsie Kramp L 01/10/2020   

## 2020-01-10 NOTE — Discharge Instructions (Signed)
10 Things You Can Do to Manage Your COVID-19 Symptoms at Home If you have possible or confirmed COVID-19: 1. Stay home from work and school. And stay away from other public places. If you must go out, avoid using any kind of public transportation, ridesharing, or taxis. 2. Monitor your symptoms carefully. If your symptoms get worse, call your healthcare provider immediately. 3. Get rest and stay hydrated. 4. If you have a medical appointment, call the healthcare provider ahead of time and tell them that you have or may have COVID-19. 5. For medical emergencies, call 911 and notify the dispatch personnel that you have or may have COVID-19. 6. Cover your cough and sneezes with a tissue or use the inside of your elbow. 7. Wash your hands often with soap and water for at least 20 seconds or clean your hands with an alcohol-based hand sanitizer that contains at least 60% alcohol. 8. As much as possible, stay in a specific room and away from other people in your home. Also, you should use a separate bathroom, if available. If you need to be around other people in or outside of the home, wear a mask. 9. Avoid sharing personal items with other people in your household, like dishes, towels, and bedding. 10. Clean all surfaces that are touched often, like counters, tabletops, and doorknobs. Use household cleaning sprays or wipes according to the label instructions. cdc.gov/coronavirus 07/15/2018 This information is not intended to replace advice given to you by your health care provider. Make sure you discuss any questions you have with your health care provider. Document Revised: 12/17/2018 Document Reviewed: 12/17/2018 Elsevier Patient Education  2020 Elsevier Inc. What types of side effects do monoclonal antibody drugs cause?  Common side effects  In general, the more common side effects caused by monoclonal antibody drugs include: . Allergic reactions, such as hives or itching . Flu-like signs and  symptoms, including chills, fatigue, fever, and muscle aches and pains . Nausea, vomiting . Diarrhea . Skin rashes . Low blood pressure   The CDC is recommending patients who receive monoclonal antibody treatments wait at least 90 days before being vaccinated.  Currently, there are no data on the safety and efficacy of mRNA COVID-19 vaccines in persons who received monoclonal antibodies or convalescent plasma as part of COVID-19 treatment. Based on the estimated half-life of such therapies as well as evidence suggesting that reinfection is uncommon in the 90 days after initial infection, vaccination should be deferred for at least 90 days, as a precautionary measure until additional information becomes available, to avoid interference of the antibody treatment with vaccine-induced immune responses. If you have any questions or concerns after the infusion please call the Advanced Practice Provider on call at [phone]. This number is ONLY intended for your use regarding questions or concerns about the infusion post-treatment side-effects.  Please do not provide this number to others for use. For return to work notes please contact your primary care provider.   If someone you know is interested in receiving treatment please have them call the COVID hotline at [phone].   

## 2020-01-10 NOTE — Telephone Encounter (Signed)
I connected by phone with Raymond Herring on 01/10/2020 at 10:49 AM to discuss the potential use of a new treatment for mild to moderate COVID-19 viral infection in non-hospitalized patients.  This patient is a 69 y.o. male that meets the FDA criteria for Emergency Use Authorization of COVID monoclonal antibody casirivimab/imdevimab, bamlanivimab/etesevimab, or sotrovimab.  Has a (+) direct SARS-CoV-2 viral test result  Has mild or moderate COVID-19   Is NOT hospitalized due to COVID-19  Is within 10 days of symptom onset  Has at least one of the high risk factor(s) for progression to severe COVID-19 and/or hospitalization as defined in EUA.  Specific high risk criteria : Older age (>/= 69 yo), BMI > 25, Diabetes and Cardiovascular disease or hypertension   I have spoken and communicated the following to the patient or parent/caregiver regarding COVID monoclonal antibody treatment:  1. FDA has authorized the emergency use for the treatment of mild to moderate COVID-19 in adults and pediatric patients with positive results of direct SARS-CoV-2 viral testing who are 71 years of age and older weighing at least 40 kg, and who are at high risk for progressing to severe COVID-19 and/or hospitalization.  2. The significant known and potential risks and benefits of COVID monoclonal antibody, and the extent to which such potential risks and benefits are unknown.  3. Information on available alternative treatments and the risks and benefits of those alternatives, including clinical trials.  4. Patients treated with COVID monoclonal antibody should continue to self-isolate and use infection control measures (e.g., wear mask, isolate, social distance, avoid sharing personal items, clean and disinfect "high touch" surfaces, and frequent handwashing) according to CDC guidelines.   5. The patient or parent/caregiver has the option to accept or refuse COVID monoclonal antibody treatment.  After  reviewing this information with the patient, the patient has agreed to receive one of the available covid 19 monoclonal antibodies and will be provided an appropriate fact sheet prior to infusion. Gabriel Cirri, NP 01/10/2020 10:49 AM  Sx onset 12/22

## 2020-01-10 NOTE — Progress Notes (Signed)
Patient reviewed Fact Sheet for Patients, Parents, and Caregivers for Emergency Use Authorization (EUA) of Casirivimab and Imdevimab for the Treatment of Coronavirus. Patient also reviewed and is agreeable to the estimated cost of treatment. Patient is agreeable to proceed.   

## 2020-01-11 ENCOUNTER — Ambulatory Visit (HOSPITAL_COMMUNITY): Payer: Medicare Other

## 2020-01-19 DIAGNOSIS — U071 COVID-19: Secondary | ICD-10-CM | POA: Diagnosis not present

## 2020-02-16 DIAGNOSIS — H2511 Age-related nuclear cataract, right eye: Secondary | ICD-10-CM | POA: Diagnosis not present

## 2020-02-16 DIAGNOSIS — H25811 Combined forms of age-related cataract, right eye: Secondary | ICD-10-CM | POA: Diagnosis not present

## 2020-02-22 DIAGNOSIS — H25012 Cortical age-related cataract, left eye: Secondary | ICD-10-CM | POA: Diagnosis not present

## 2020-02-22 DIAGNOSIS — H2512 Age-related nuclear cataract, left eye: Secondary | ICD-10-CM | POA: Diagnosis not present

## 2020-02-22 DIAGNOSIS — E1121 Type 2 diabetes mellitus with diabetic nephropathy: Secondary | ICD-10-CM | POA: Diagnosis not present

## 2020-02-22 DIAGNOSIS — E785 Hyperlipidemia, unspecified: Secondary | ICD-10-CM | POA: Diagnosis not present

## 2020-02-22 DIAGNOSIS — Z125 Encounter for screening for malignant neoplasm of prostate: Secondary | ICD-10-CM | POA: Diagnosis not present

## 2020-02-22 DIAGNOSIS — M109 Gout, unspecified: Secondary | ICD-10-CM | POA: Diagnosis not present

## 2020-02-29 DIAGNOSIS — I10 Essential (primary) hypertension: Secondary | ICD-10-CM | POA: Diagnosis not present

## 2020-02-29 DIAGNOSIS — Z1212 Encounter for screening for malignant neoplasm of rectum: Secondary | ICD-10-CM | POA: Diagnosis not present

## 2020-02-29 DIAGNOSIS — Z0000 Encounter for general adult medical examination without abnormal findings: Secondary | ICD-10-CM | POA: Diagnosis not present

## 2020-02-29 DIAGNOSIS — E785 Hyperlipidemia, unspecified: Secondary | ICD-10-CM | POA: Diagnosis not present

## 2020-02-29 DIAGNOSIS — C61 Malignant neoplasm of prostate: Secondary | ICD-10-CM | POA: Diagnosis not present

## 2020-02-29 DIAGNOSIS — E1151 Type 2 diabetes mellitus with diabetic peripheral angiopathy without gangrene: Secondary | ICD-10-CM | POA: Diagnosis not present

## 2020-02-29 DIAGNOSIS — Z1331 Encounter for screening for depression: Secondary | ICD-10-CM | POA: Diagnosis not present

## 2020-02-29 DIAGNOSIS — M14679 Charcot's joint, unspecified ankle and foot: Secondary | ICD-10-CM | POA: Diagnosis not present

## 2020-02-29 DIAGNOSIS — M109 Gout, unspecified: Secondary | ICD-10-CM | POA: Diagnosis not present

## 2020-02-29 DIAGNOSIS — I739 Peripheral vascular disease, unspecified: Secondary | ICD-10-CM | POA: Diagnosis not present

## 2020-02-29 DIAGNOSIS — R2681 Unsteadiness on feet: Secondary | ICD-10-CM | POA: Diagnosis not present

## 2020-02-29 DIAGNOSIS — R82998 Other abnormal findings in urine: Secondary | ICD-10-CM | POA: Diagnosis not present

## 2020-02-29 DIAGNOSIS — E1140 Type 2 diabetes mellitus with diabetic neuropathy, unspecified: Secondary | ICD-10-CM | POA: Diagnosis not present

## 2020-03-01 DIAGNOSIS — H25012 Cortical age-related cataract, left eye: Secondary | ICD-10-CM | POA: Diagnosis not present

## 2020-03-01 DIAGNOSIS — H25812 Combined forms of age-related cataract, left eye: Secondary | ICD-10-CM | POA: Diagnosis not present

## 2020-03-01 DIAGNOSIS — H2512 Age-related nuclear cataract, left eye: Secondary | ICD-10-CM | POA: Diagnosis not present

## 2020-03-01 DIAGNOSIS — H5703 Miosis: Secondary | ICD-10-CM | POA: Diagnosis not present

## 2020-03-23 DIAGNOSIS — C61 Malignant neoplasm of prostate: Secondary | ICD-10-CM | POA: Diagnosis not present

## 2020-03-24 ENCOUNTER — Ambulatory Visit (INDEPENDENT_AMBULATORY_CARE_PROVIDER_SITE_OTHER): Payer: Medicare Other | Admitting: Podiatry

## 2020-03-24 ENCOUNTER — Other Ambulatory Visit: Payer: Self-pay

## 2020-03-24 ENCOUNTER — Encounter: Payer: Self-pay | Admitting: Podiatry

## 2020-03-24 DIAGNOSIS — E11610 Type 2 diabetes mellitus with diabetic neuropathic arthropathy: Secondary | ICD-10-CM | POA: Diagnosis not present

## 2020-03-24 DIAGNOSIS — M79675 Pain in left toe(s): Secondary | ICD-10-CM | POA: Diagnosis not present

## 2020-03-24 DIAGNOSIS — B351 Tinea unguium: Secondary | ICD-10-CM

## 2020-03-24 DIAGNOSIS — M79674 Pain in right toe(s): Secondary | ICD-10-CM

## 2020-03-24 DIAGNOSIS — D689 Coagulation defect, unspecified: Secondary | ICD-10-CM | POA: Diagnosis not present

## 2020-03-24 NOTE — Progress Notes (Signed)
This patient returns to my office for at risk foot care.  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  This patient is unable to cut nails himself since the patient cannot reach his nails.These nails are painful walking and wearing shoes.  This patient presents for at risk foot care today.  General Appearance  Alert, conversant and in no acute stress.  Vascular  Dorsalis pedis and posterior tibial  pulses are palpable  bilaterally.  Capillary return is within normal limits  bilaterally. Temperature is within normal limits  bilaterally.  Neurologic  Senn-Weinstein monofilament wire test within normal limits  bilaterally. Muscle power within normal limits bilaterally.  Nails Thick disfigured discolored nails with subungual debris  from hallux to fifth toes bilaterally. No evidence of bacterial infection or drainage bilaterally.  Orthopedic  No limitations of motion  feet .  No crepitus or effusions noted.  No bony pathology or digital deformities noted.  Asymptomatic  HAV.  Limited ROM STJ right foot.  Skin  normotropic skin with no porokeratosis noted bilaterally.  No signs of infections or ulcers noted.     Onychomycosis  Pain in right toes  Pain in left toes  Consent was obtained for treatment procedures.   Mechanical debridement of nails 1-5  bilaterally performed with a nail nipper.  Filed with dremel without incident.    Return office visit    3 months                  Told patient to return for periodic foot care and evaluation due to potential at risk complications.   Loras Willodean Leven DPM  

## 2020-03-28 DIAGNOSIS — M79672 Pain in left foot: Secondary | ICD-10-CM | POA: Diagnosis not present

## 2020-03-28 DIAGNOSIS — M19072 Primary osteoarthritis, left ankle and foot: Secondary | ICD-10-CM | POA: Diagnosis not present

## 2020-03-28 DIAGNOSIS — M14671 Charcot's joint, right ankle and foot: Secondary | ICD-10-CM | POA: Diagnosis not present

## 2020-03-28 DIAGNOSIS — M79671 Pain in right foot: Secondary | ICD-10-CM | POA: Diagnosis not present

## 2020-03-30 DIAGNOSIS — N393 Stress incontinence (female) (male): Secondary | ICD-10-CM | POA: Diagnosis not present

## 2020-03-30 DIAGNOSIS — C61 Malignant neoplasm of prostate: Secondary | ICD-10-CM | POA: Diagnosis not present

## 2020-03-30 DIAGNOSIS — N5201 Erectile dysfunction due to arterial insufficiency: Secondary | ICD-10-CM | POA: Diagnosis not present

## 2020-04-17 DIAGNOSIS — M5459 Other low back pain: Secondary | ICD-10-CM | POA: Diagnosis not present

## 2020-05-10 DIAGNOSIS — M5459 Other low back pain: Secondary | ICD-10-CM | POA: Diagnosis not present

## 2020-05-10 DIAGNOSIS — M545 Low back pain, unspecified: Secondary | ICD-10-CM | POA: Diagnosis not present

## 2020-05-24 DIAGNOSIS — M545 Low back pain, unspecified: Secondary | ICD-10-CM | POA: Diagnosis not present

## 2020-05-30 DIAGNOSIS — E785 Hyperlipidemia, unspecified: Secondary | ICD-10-CM | POA: Diagnosis not present

## 2020-05-30 DIAGNOSIS — R2681 Unsteadiness on feet: Secondary | ICD-10-CM | POA: Diagnosis not present

## 2020-05-30 DIAGNOSIS — M48062 Spinal stenosis, lumbar region with neurogenic claudication: Secondary | ICD-10-CM | POA: Diagnosis not present

## 2020-05-30 DIAGNOSIS — E1140 Type 2 diabetes mellitus with diabetic neuropathy, unspecified: Secondary | ICD-10-CM | POA: Diagnosis not present

## 2020-05-30 DIAGNOSIS — E1151 Type 2 diabetes mellitus with diabetic peripheral angiopathy without gangrene: Secondary | ICD-10-CM | POA: Diagnosis not present

## 2020-05-30 DIAGNOSIS — I10 Essential (primary) hypertension: Secondary | ICD-10-CM | POA: Diagnosis not present

## 2020-05-30 DIAGNOSIS — I739 Peripheral vascular disease, unspecified: Secondary | ICD-10-CM | POA: Diagnosis not present

## 2020-06-09 DIAGNOSIS — M4310 Spondylolisthesis, site unspecified: Secondary | ICD-10-CM | POA: Diagnosis not present

## 2020-06-09 DIAGNOSIS — M545 Low back pain, unspecified: Secondary | ICD-10-CM | POA: Diagnosis not present

## 2020-06-20 DIAGNOSIS — M545 Low back pain, unspecified: Secondary | ICD-10-CM | POA: Diagnosis not present

## 2020-06-20 DIAGNOSIS — M5416 Radiculopathy, lumbar region: Secondary | ICD-10-CM | POA: Diagnosis not present

## 2020-06-28 ENCOUNTER — Ambulatory Visit (INDEPENDENT_AMBULATORY_CARE_PROVIDER_SITE_OTHER): Payer: Medicare Other | Admitting: Podiatry

## 2020-06-28 ENCOUNTER — Other Ambulatory Visit: Payer: Self-pay

## 2020-06-28 ENCOUNTER — Encounter: Payer: Self-pay | Admitting: Podiatry

## 2020-06-28 DIAGNOSIS — M79674 Pain in right toe(s): Secondary | ICD-10-CM | POA: Diagnosis not present

## 2020-06-28 DIAGNOSIS — D689 Coagulation defect, unspecified: Secondary | ICD-10-CM | POA: Diagnosis not present

## 2020-06-28 DIAGNOSIS — B351 Tinea unguium: Secondary | ICD-10-CM

## 2020-06-28 DIAGNOSIS — M79675 Pain in left toe(s): Secondary | ICD-10-CM

## 2020-06-28 DIAGNOSIS — E11610 Type 2 diabetes mellitus with diabetic neuropathic arthropathy: Secondary | ICD-10-CM | POA: Diagnosis not present

## 2020-06-28 NOTE — Progress Notes (Signed)
This patient returns to my office for at risk foot care.  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  This patient is unable to cut nails himself since the patient cannot reach his nails.These nails are painful walking and wearing shoes.  This patient presents for at risk foot care today.  General Appearance  Alert, conversant and in no acute stress.  Vascular  Dorsalis pedis and posterior tibial  pulses are palpable  bilaterally.  Capillary return is within normal limits  bilaterally. Temperature is within normal limits  bilaterally.  Neurologic  Senn-Weinstein monofilament wire test within normal limits  bilaterally. Muscle power within normal limits bilaterally.  Nails Thick disfigured discolored nails with subungual debris  from hallux to fifth toes bilaterally. No evidence of bacterial infection or drainage bilaterally.  Orthopedic  No limitations of motion  feet .  No crepitus or effusions noted.  No bony pathology or digital deformities noted.  Asymptomatic  HAV.  Limited ROM STJ right foot.  Skin  normotropic skin with no porokeratosis noted bilaterally.  No signs of infections or ulcers noted.     Onychomycosis  Pain in right toes  Pain in left toes  Consent was obtained for treatment procedures.   Mechanical debridement of nails 1-5  bilaterally performed with a nail nipper.  Filed with dremel without incident.    Return office visit    3 months                  Told patient to return for periodic foot care and evaluation due to potential at risk complications.   Fidel Landon Bassford DPM  

## 2020-06-29 DIAGNOSIS — E785 Hyperlipidemia, unspecified: Secondary | ICD-10-CM | POA: Diagnosis not present

## 2020-06-29 DIAGNOSIS — I10 Essential (primary) hypertension: Secondary | ICD-10-CM | POA: Diagnosis not present

## 2020-06-29 DIAGNOSIS — E1151 Type 2 diabetes mellitus with diabetic peripheral angiopathy without gangrene: Secondary | ICD-10-CM | POA: Diagnosis not present

## 2020-06-29 DIAGNOSIS — I739 Peripheral vascular disease, unspecified: Secondary | ICD-10-CM | POA: Diagnosis not present

## 2020-06-29 DIAGNOSIS — M48062 Spinal stenosis, lumbar region with neurogenic claudication: Secondary | ICD-10-CM | POA: Diagnosis not present

## 2020-06-30 DIAGNOSIS — M5416 Radiculopathy, lumbar region: Secondary | ICD-10-CM | POA: Diagnosis not present

## 2020-06-30 DIAGNOSIS — M545 Low back pain, unspecified: Secondary | ICD-10-CM | POA: Diagnosis not present

## 2020-07-03 DIAGNOSIS — M5416 Radiculopathy, lumbar region: Secondary | ICD-10-CM | POA: Diagnosis not present

## 2020-07-03 DIAGNOSIS — M545 Low back pain, unspecified: Secondary | ICD-10-CM | POA: Diagnosis not present

## 2020-07-05 DIAGNOSIS — L918 Other hypertrophic disorders of the skin: Secondary | ICD-10-CM | POA: Diagnosis not present

## 2020-07-05 DIAGNOSIS — L570 Actinic keratosis: Secondary | ICD-10-CM | POA: Diagnosis not present

## 2020-07-05 DIAGNOSIS — L814 Other melanin hyperpigmentation: Secondary | ICD-10-CM | POA: Diagnosis not present

## 2020-07-05 DIAGNOSIS — D1801 Hemangioma of skin and subcutaneous tissue: Secondary | ICD-10-CM | POA: Diagnosis not present

## 2020-07-05 DIAGNOSIS — L821 Other seborrheic keratosis: Secondary | ICD-10-CM | POA: Diagnosis not present

## 2020-07-05 DIAGNOSIS — D692 Other nonthrombocytopenic purpura: Secondary | ICD-10-CM | POA: Diagnosis not present

## 2020-07-05 DIAGNOSIS — Z85828 Personal history of other malignant neoplasm of skin: Secondary | ICD-10-CM | POA: Diagnosis not present

## 2020-07-06 DIAGNOSIS — M5416 Radiculopathy, lumbar region: Secondary | ICD-10-CM | POA: Diagnosis not present

## 2020-07-06 DIAGNOSIS — M545 Low back pain, unspecified: Secondary | ICD-10-CM | POA: Diagnosis not present

## 2020-07-11 DIAGNOSIS — M545 Low back pain, unspecified: Secondary | ICD-10-CM | POA: Diagnosis not present

## 2020-07-11 DIAGNOSIS — M5416 Radiculopathy, lumbar region: Secondary | ICD-10-CM | POA: Diagnosis not present

## 2020-07-13 DIAGNOSIS — M5416 Radiculopathy, lumbar region: Secondary | ICD-10-CM | POA: Diagnosis not present

## 2020-07-13 DIAGNOSIS — M545 Low back pain, unspecified: Secondary | ICD-10-CM | POA: Diagnosis not present

## 2020-07-25 DIAGNOSIS — M5416 Radiculopathy, lumbar region: Secondary | ICD-10-CM | POA: Diagnosis not present

## 2020-07-25 DIAGNOSIS — M545 Low back pain, unspecified: Secondary | ICD-10-CM | POA: Diagnosis not present

## 2020-07-27 DIAGNOSIS — M5416 Radiculopathy, lumbar region: Secondary | ICD-10-CM | POA: Diagnosis not present

## 2020-07-27 DIAGNOSIS — M545 Low back pain, unspecified: Secondary | ICD-10-CM | POA: Diagnosis not present

## 2020-07-31 DIAGNOSIS — E1151 Type 2 diabetes mellitus with diabetic peripheral angiopathy without gangrene: Secondary | ICD-10-CM | POA: Diagnosis not present

## 2020-07-31 DIAGNOSIS — M48062 Spinal stenosis, lumbar region with neurogenic claudication: Secondary | ICD-10-CM | POA: Diagnosis not present

## 2020-07-31 DIAGNOSIS — I10 Essential (primary) hypertension: Secondary | ICD-10-CM | POA: Diagnosis not present

## 2020-07-31 DIAGNOSIS — I739 Peripheral vascular disease, unspecified: Secondary | ICD-10-CM | POA: Diagnosis not present

## 2020-07-31 DIAGNOSIS — M109 Gout, unspecified: Secondary | ICD-10-CM | POA: Diagnosis not present

## 2020-08-01 DIAGNOSIS — M5416 Radiculopathy, lumbar region: Secondary | ICD-10-CM | POA: Diagnosis not present

## 2020-08-01 DIAGNOSIS — E1121 Type 2 diabetes mellitus with diabetic nephropathy: Secondary | ICD-10-CM | POA: Diagnosis not present

## 2020-08-01 DIAGNOSIS — E785 Hyperlipidemia, unspecified: Secondary | ICD-10-CM | POA: Diagnosis not present

## 2020-08-01 DIAGNOSIS — M545 Low back pain, unspecified: Secondary | ICD-10-CM | POA: Diagnosis not present

## 2020-08-01 DIAGNOSIS — I10 Essential (primary) hypertension: Secondary | ICD-10-CM | POA: Diagnosis not present

## 2020-08-03 DIAGNOSIS — M545 Low back pain, unspecified: Secondary | ICD-10-CM | POA: Diagnosis not present

## 2020-08-03 DIAGNOSIS — M5416 Radiculopathy, lumbar region: Secondary | ICD-10-CM | POA: Diagnosis not present

## 2020-08-08 DIAGNOSIS — M545 Low back pain, unspecified: Secondary | ICD-10-CM | POA: Diagnosis not present

## 2020-08-08 DIAGNOSIS — M5416 Radiculopathy, lumbar region: Secondary | ICD-10-CM | POA: Diagnosis not present

## 2020-08-10 DIAGNOSIS — M5416 Radiculopathy, lumbar region: Secondary | ICD-10-CM | POA: Diagnosis not present

## 2020-08-10 DIAGNOSIS — M545 Low back pain, unspecified: Secondary | ICD-10-CM | POA: Diagnosis not present

## 2020-08-15 DIAGNOSIS — M5416 Radiculopathy, lumbar region: Secondary | ICD-10-CM | POA: Diagnosis not present

## 2020-08-15 DIAGNOSIS — M545 Low back pain, unspecified: Secondary | ICD-10-CM | POA: Diagnosis not present

## 2020-08-17 DIAGNOSIS — M545 Low back pain, unspecified: Secondary | ICD-10-CM | POA: Diagnosis not present

## 2020-08-17 DIAGNOSIS — M5416 Radiculopathy, lumbar region: Secondary | ICD-10-CM | POA: Diagnosis not present

## 2020-08-22 DIAGNOSIS — M545 Low back pain, unspecified: Secondary | ICD-10-CM | POA: Diagnosis not present

## 2020-08-22 DIAGNOSIS — M5416 Radiculopathy, lumbar region: Secondary | ICD-10-CM | POA: Diagnosis not present

## 2020-08-24 DIAGNOSIS — M545 Low back pain, unspecified: Secondary | ICD-10-CM | POA: Diagnosis not present

## 2020-08-24 DIAGNOSIS — M5416 Radiculopathy, lumbar region: Secondary | ICD-10-CM | POA: Diagnosis not present

## 2020-08-25 DIAGNOSIS — H35373 Puckering of macula, bilateral: Secondary | ICD-10-CM | POA: Diagnosis not present

## 2020-08-25 DIAGNOSIS — E119 Type 2 diabetes mellitus without complications: Secondary | ICD-10-CM | POA: Diagnosis not present

## 2020-08-25 DIAGNOSIS — H04123 Dry eye syndrome of bilateral lacrimal glands: Secondary | ICD-10-CM | POA: Diagnosis not present

## 2020-08-25 DIAGNOSIS — H26491 Other secondary cataract, right eye: Secondary | ICD-10-CM | POA: Diagnosis not present

## 2020-08-25 DIAGNOSIS — Z961 Presence of intraocular lens: Secondary | ICD-10-CM | POA: Diagnosis not present

## 2020-08-29 DIAGNOSIS — M545 Low back pain, unspecified: Secondary | ICD-10-CM | POA: Diagnosis not present

## 2020-08-29 DIAGNOSIS — M5416 Radiculopathy, lumbar region: Secondary | ICD-10-CM | POA: Diagnosis not present

## 2020-08-31 DIAGNOSIS — M545 Low back pain, unspecified: Secondary | ICD-10-CM | POA: Diagnosis not present

## 2020-08-31 DIAGNOSIS — M5416 Radiculopathy, lumbar region: Secondary | ICD-10-CM | POA: Diagnosis not present

## 2020-09-05 DIAGNOSIS — M545 Low back pain, unspecified: Secondary | ICD-10-CM | POA: Diagnosis not present

## 2020-09-05 DIAGNOSIS — M5416 Radiculopathy, lumbar region: Secondary | ICD-10-CM | POA: Diagnosis not present

## 2020-09-07 DIAGNOSIS — M5416 Radiculopathy, lumbar region: Secondary | ICD-10-CM | POA: Diagnosis not present

## 2020-09-07 DIAGNOSIS — M545 Low back pain, unspecified: Secondary | ICD-10-CM | POA: Diagnosis not present

## 2020-09-08 DIAGNOSIS — M545 Low back pain, unspecified: Secondary | ICD-10-CM | POA: Diagnosis not present

## 2020-09-12 DIAGNOSIS — M5416 Radiculopathy, lumbar region: Secondary | ICD-10-CM | POA: Diagnosis not present

## 2020-09-12 DIAGNOSIS — M545 Low back pain, unspecified: Secondary | ICD-10-CM | POA: Diagnosis not present

## 2020-09-14 DIAGNOSIS — M545 Low back pain, unspecified: Secondary | ICD-10-CM | POA: Diagnosis not present

## 2020-09-14 DIAGNOSIS — M5416 Radiculopathy, lumbar region: Secondary | ICD-10-CM | POA: Diagnosis not present

## 2020-09-19 DIAGNOSIS — C61 Malignant neoplasm of prostate: Secondary | ICD-10-CM | POA: Diagnosis not present

## 2020-09-20 DIAGNOSIS — M5416 Radiculopathy, lumbar region: Secondary | ICD-10-CM | POA: Diagnosis not present

## 2020-09-20 DIAGNOSIS — M545 Low back pain, unspecified: Secondary | ICD-10-CM | POA: Diagnosis not present

## 2020-09-21 DIAGNOSIS — M545 Low back pain, unspecified: Secondary | ICD-10-CM | POA: Diagnosis not present

## 2020-09-21 DIAGNOSIS — M5416 Radiculopathy, lumbar region: Secondary | ICD-10-CM | POA: Diagnosis not present

## 2020-09-25 DIAGNOSIS — N5201 Erectile dysfunction due to arterial insufficiency: Secondary | ICD-10-CM | POA: Diagnosis not present

## 2020-09-25 DIAGNOSIS — C61 Malignant neoplasm of prostate: Secondary | ICD-10-CM | POA: Diagnosis not present

## 2020-09-25 DIAGNOSIS — N393 Stress incontinence (female) (male): Secondary | ICD-10-CM | POA: Diagnosis not present

## 2020-09-26 DIAGNOSIS — M5416 Radiculopathy, lumbar region: Secondary | ICD-10-CM | POA: Diagnosis not present

## 2020-09-26 DIAGNOSIS — M545 Low back pain, unspecified: Secondary | ICD-10-CM | POA: Diagnosis not present

## 2020-09-28 DIAGNOSIS — M545 Low back pain, unspecified: Secondary | ICD-10-CM | POA: Diagnosis not present

## 2020-09-28 DIAGNOSIS — M5416 Radiculopathy, lumbar region: Secondary | ICD-10-CM | POA: Diagnosis not present

## 2020-10-02 DIAGNOSIS — M5416 Radiculopathy, lumbar region: Secondary | ICD-10-CM | POA: Diagnosis not present

## 2020-10-02 DIAGNOSIS — M545 Low back pain, unspecified: Secondary | ICD-10-CM | POA: Diagnosis not present

## 2020-10-03 DIAGNOSIS — E1121 Type 2 diabetes mellitus with diabetic nephropathy: Secondary | ICD-10-CM | POA: Diagnosis not present

## 2020-10-03 DIAGNOSIS — I10 Essential (primary) hypertension: Secondary | ICD-10-CM | POA: Diagnosis not present

## 2020-10-03 DIAGNOSIS — E785 Hyperlipidemia, unspecified: Secondary | ICD-10-CM | POA: Diagnosis not present

## 2020-10-04 ENCOUNTER — Ambulatory Visit (INDEPENDENT_AMBULATORY_CARE_PROVIDER_SITE_OTHER): Payer: Medicare Other | Admitting: Podiatry

## 2020-10-04 ENCOUNTER — Other Ambulatory Visit: Payer: Self-pay

## 2020-10-04 ENCOUNTER — Encounter: Payer: Self-pay | Admitting: Podiatry

## 2020-10-04 DIAGNOSIS — M79675 Pain in left toe(s): Secondary | ICD-10-CM | POA: Diagnosis not present

## 2020-10-04 DIAGNOSIS — B351 Tinea unguium: Secondary | ICD-10-CM

## 2020-10-04 DIAGNOSIS — E11610 Type 2 diabetes mellitus with diabetic neuropathic arthropathy: Secondary | ICD-10-CM

## 2020-10-04 DIAGNOSIS — D689 Coagulation defect, unspecified: Secondary | ICD-10-CM

## 2020-10-04 DIAGNOSIS — M79674 Pain in right toe(s): Secondary | ICD-10-CM

## 2020-10-04 NOTE — Progress Notes (Signed)
This patient returns to my office for at risk foot care.  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  This patient is unable to cut nails himself since the patient cannot reach his nails.These nails are painful walking and wearing shoes.  This patient presents for at risk foot care today.  General Appearance  Alert, conversant and in no acute stress.  Vascular  Dorsalis pedis and posterior tibial  pulses are palpable  bilaterally.  Capillary return is within normal limits  bilaterally. Temperature is within normal limits  bilaterally.  Neurologic  Senn-Weinstein monofilament wire test within normal limits  bilaterally. Muscle power within normal limits bilaterally.  Nails Thick disfigured discolored nails with subungual debris  from hallux to fifth toes bilaterally. No evidence of bacterial infection or drainage bilaterally.  Orthopedic  No limitations of motion  feet .  No crepitus or effusions noted.  No bony pathology or digital deformities noted.  Asymptomatic  HAV.  Limited ROM STJ right foot.  Skin  normotropic skin with no porokeratosis noted bilaterally.  No signs of infections or ulcers noted.     Onychomycosis  Pain in right toes  Pain in left toes  Consent was obtained for treatment procedures.   Mechanical debridement of nails 1-5  bilaterally performed with a nail nipper.  Filed with dremel without incident.    Return office visit    3 months                  Told patient to return for periodic foot care and evaluation due to potential at risk complications.   Sir Janelie Goltz DPM  

## 2020-10-05 DIAGNOSIS — M545 Low back pain, unspecified: Secondary | ICD-10-CM | POA: Diagnosis not present

## 2020-10-05 DIAGNOSIS — M5416 Radiculopathy, lumbar region: Secondary | ICD-10-CM | POA: Diagnosis not present

## 2020-10-10 DIAGNOSIS — M5416 Radiculopathy, lumbar region: Secondary | ICD-10-CM | POA: Diagnosis not present

## 2020-10-10 DIAGNOSIS — M545 Low back pain, unspecified: Secondary | ICD-10-CM | POA: Diagnosis not present

## 2020-10-11 DIAGNOSIS — H26491 Other secondary cataract, right eye: Secondary | ICD-10-CM | POA: Diagnosis not present

## 2020-10-11 DIAGNOSIS — H35373 Puckering of macula, bilateral: Secondary | ICD-10-CM | POA: Diagnosis not present

## 2020-10-11 DIAGNOSIS — Z961 Presence of intraocular lens: Secondary | ICD-10-CM | POA: Diagnosis not present

## 2020-10-11 DIAGNOSIS — H04123 Dry eye syndrome of bilateral lacrimal glands: Secondary | ICD-10-CM | POA: Diagnosis not present

## 2020-10-11 DIAGNOSIS — H524 Presbyopia: Secondary | ICD-10-CM | POA: Diagnosis not present

## 2020-10-12 DIAGNOSIS — M5416 Radiculopathy, lumbar region: Secondary | ICD-10-CM | POA: Diagnosis not present

## 2020-10-12 DIAGNOSIS — M545 Low back pain, unspecified: Secondary | ICD-10-CM | POA: Diagnosis not present

## 2020-10-17 DIAGNOSIS — M545 Low back pain, unspecified: Secondary | ICD-10-CM | POA: Diagnosis not present

## 2020-10-17 DIAGNOSIS — M5416 Radiculopathy, lumbar region: Secondary | ICD-10-CM | POA: Diagnosis not present

## 2020-10-19 DIAGNOSIS — M5416 Radiculopathy, lumbar region: Secondary | ICD-10-CM | POA: Diagnosis not present

## 2020-10-19 DIAGNOSIS — M545 Low back pain, unspecified: Secondary | ICD-10-CM | POA: Diagnosis not present

## 2020-10-24 DIAGNOSIS — M5416 Radiculopathy, lumbar region: Secondary | ICD-10-CM | POA: Diagnosis not present

## 2020-10-24 DIAGNOSIS — M545 Low back pain, unspecified: Secondary | ICD-10-CM | POA: Diagnosis not present

## 2020-10-26 DIAGNOSIS — M545 Low back pain, unspecified: Secondary | ICD-10-CM | POA: Diagnosis not present

## 2020-10-26 DIAGNOSIS — M5416 Radiculopathy, lumbar region: Secondary | ICD-10-CM | POA: Diagnosis not present

## 2020-10-31 DIAGNOSIS — E785 Hyperlipidemia, unspecified: Secondary | ICD-10-CM | POA: Diagnosis not present

## 2020-10-31 DIAGNOSIS — Z8546 Personal history of malignant neoplasm of prostate: Secondary | ICD-10-CM | POA: Diagnosis not present

## 2020-10-31 DIAGNOSIS — I739 Peripheral vascular disease, unspecified: Secondary | ICD-10-CM | POA: Diagnosis not present

## 2020-10-31 DIAGNOSIS — I10 Essential (primary) hypertension: Secondary | ICD-10-CM | POA: Diagnosis not present

## 2020-10-31 DIAGNOSIS — M48062 Spinal stenosis, lumbar region with neurogenic claudication: Secondary | ICD-10-CM | POA: Diagnosis not present

## 2020-10-31 DIAGNOSIS — R2681 Unsteadiness on feet: Secondary | ICD-10-CM | POA: Diagnosis not present

## 2020-10-31 DIAGNOSIS — E1151 Type 2 diabetes mellitus with diabetic peripheral angiopathy without gangrene: Secondary | ICD-10-CM | POA: Diagnosis not present

## 2020-11-06 ENCOUNTER — Other Ambulatory Visit: Payer: Self-pay | Admitting: Urology

## 2020-11-15 ENCOUNTER — Telehealth: Payer: Self-pay

## 2020-11-15 NOTE — Telephone Encounter (Signed)
Patient called wanting a RX refill for trospium (SANCTURA) 20 MG tablet [[id number]]. Per- Ashlyn Bruning- PA-C, patient instructed to contact [facility] for an office visit to continue this specific medication. Patient verbalized understanding.

## 2020-12-12 DIAGNOSIS — H04123 Dry eye syndrome of bilateral lacrimal glands: Secondary | ICD-10-CM | POA: Diagnosis not present

## 2020-12-26 DIAGNOSIS — H04122 Dry eye syndrome of left lacrimal gland: Secondary | ICD-10-CM | POA: Diagnosis not present

## 2020-12-26 DIAGNOSIS — H04121 Dry eye syndrome of right lacrimal gland: Secondary | ICD-10-CM | POA: Diagnosis not present

## 2021-01-02 DIAGNOSIS — E785 Hyperlipidemia, unspecified: Secondary | ICD-10-CM | POA: Diagnosis not present

## 2021-01-02 DIAGNOSIS — E669 Obesity, unspecified: Secondary | ICD-10-CM | POA: Diagnosis not present

## 2021-01-02 DIAGNOSIS — I10 Essential (primary) hypertension: Secondary | ICD-10-CM | POA: Diagnosis not present

## 2021-01-02 DIAGNOSIS — E1121 Type 2 diabetes mellitus with diabetic nephropathy: Secondary | ICD-10-CM | POA: Diagnosis not present

## 2021-01-03 ENCOUNTER — Encounter: Payer: Self-pay | Admitting: Podiatry

## 2021-01-03 ENCOUNTER — Other Ambulatory Visit: Payer: Self-pay

## 2021-01-03 ENCOUNTER — Ambulatory Visit (INDEPENDENT_AMBULATORY_CARE_PROVIDER_SITE_OTHER): Payer: Medicare Other | Admitting: Podiatry

## 2021-01-03 DIAGNOSIS — E11610 Type 2 diabetes mellitus with diabetic neuropathic arthropathy: Secondary | ICD-10-CM

## 2021-01-03 DIAGNOSIS — M79674 Pain in right toe(s): Secondary | ICD-10-CM | POA: Diagnosis not present

## 2021-01-03 DIAGNOSIS — M79675 Pain in left toe(s): Secondary | ICD-10-CM

## 2021-01-03 DIAGNOSIS — D689 Coagulation defect, unspecified: Secondary | ICD-10-CM

## 2021-01-03 DIAGNOSIS — B351 Tinea unguium: Secondary | ICD-10-CM | POA: Diagnosis not present

## 2021-01-03 NOTE — Progress Notes (Signed)
This patient returns to my office for at risk foot care.  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  This patient is unable to cut nails himself since the patient cannot reach his nails.These nails are painful walking and wearing shoes.  This patient presents for at risk foot care today.  General Appearance  Alert, conversant and in no acute stress.  Vascular  Dorsalis pedis and posterior tibial  pulses are palpable  bilaterally.  Capillary return is within normal limits  bilaterally. Temperature is within normal limits  bilaterally.  Neurologic  Senn-Weinstein monofilament wire test within normal limits  bilaterally. Muscle power within normal limits bilaterally.  Nails Thick disfigured discolored nails with subungual debris  from hallux to fifth toes bilaterally. No evidence of bacterial infection or drainage bilaterally.  Orthopedic  No limitations of motion  feet .  No crepitus or effusions noted.  No bony pathology or digital deformities noted.  Asymptomatic  HAV.  Limited ROM STJ right foot.  Skin  normotropic skin with no porokeratosis noted bilaterally.  No signs of infections or ulcers noted.     Onychomycosis  Pain in right toes  Pain in left toes  Consent was obtained for treatment procedures.   Mechanical debridement of nails 1-5  bilaterally performed with a nail nipper.  Filed with dremel without incident.    Return office visit    3 months                  Told patient to return for periodic foot care and evaluation due to potential at risk complications.   Kaylan Seva Chancy DPM  

## 2021-01-05 DIAGNOSIS — C61 Malignant neoplasm of prostate: Secondary | ICD-10-CM | POA: Diagnosis not present

## 2021-01-05 DIAGNOSIS — R3915 Urgency of urination: Secondary | ICD-10-CM | POA: Diagnosis not present

## 2021-01-05 DIAGNOSIS — N393 Stress incontinence (female) (male): Secondary | ICD-10-CM | POA: Diagnosis not present

## 2021-04-04 ENCOUNTER — Encounter: Payer: Self-pay | Admitting: Podiatry

## 2021-04-04 ENCOUNTER — Ambulatory Visit (INDEPENDENT_AMBULATORY_CARE_PROVIDER_SITE_OTHER): Payer: Medicare PPO | Admitting: Podiatry

## 2021-04-04 ENCOUNTER — Other Ambulatory Visit: Payer: Self-pay

## 2021-04-04 DIAGNOSIS — M79675 Pain in left toe(s): Secondary | ICD-10-CM | POA: Diagnosis not present

## 2021-04-04 DIAGNOSIS — M79674 Pain in right toe(s): Secondary | ICD-10-CM

## 2021-04-04 DIAGNOSIS — E11610 Type 2 diabetes mellitus with diabetic neuropathic arthropathy: Secondary | ICD-10-CM | POA: Diagnosis not present

## 2021-04-04 DIAGNOSIS — D689 Coagulation defect, unspecified: Secondary | ICD-10-CM

## 2021-04-04 DIAGNOSIS — B351 Tinea unguium: Secondary | ICD-10-CM

## 2021-04-04 NOTE — Progress Notes (Signed)
This patient returns to my office for at risk foot care.  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  This patient is unable to cut nails himself since the patient cannot reach his nails.These nails are painful walking and wearing shoes.  This patient presents for at risk foot care today.  General Appearance  Alert, conversant and in no acute stress.  Vascular  Dorsalis pedis and posterior tibial  pulses are palpable  bilaterally.  Capillary return is within normal limits  bilaterally. Temperature is within normal limits  bilaterally.  Neurologic  Senn-Weinstein monofilament wire test within normal limits  bilaterally. Muscle power within normal limits bilaterally.  Nails Thick disfigured discolored nails with subungual debris  from hallux to fifth toes bilaterally. No evidence of bacterial infection or drainage bilaterally.  Orthopedic  No limitations of motion  feet .  No crepitus or effusions noted.  No bony pathology or digital deformities noted.  Asymptomatic  HAV.  Limited ROM STJ right foot.  Skin  normotropic skin with no porokeratosis noted bilaterally.  No signs of infections or ulcers noted.     Onychomycosis  Pain in right toes  Pain in left toes  Consent was obtained for treatment procedures.   Mechanical debridement of nails 1-5  bilaterally performed with a nail nipper.  Filed with dremel without incident.    Return office visit    3 months                  Told patient to return for periodic foot care and evaluation due to potential at risk complications.   Miro Cristiano Capri DPM  

## 2021-05-02 IMAGING — CT CT SHOULDER*L* W/O CM
3 series · 13 of 34 positions shown, 16 images · non-contrast
Comparison: None.

CLINICAL DATA: Left shoulder pain and decreased range of motion. No
known injury.

EXAM:
CT OF THE UPPER LEFT EXTREMITY WITHOUT CONTRAST
TECHNIQUE: Multidetector CT imaging of the upper left extremity was performed
according to the standard protocol.

[Series 2: shoulder 2.00 br40 s3 axial soft · axial · 0.56mm/px · z∈[-784,-654]mm · 5 of 95 slices shown, 7 images]
[im 15/95  soft-tissue]
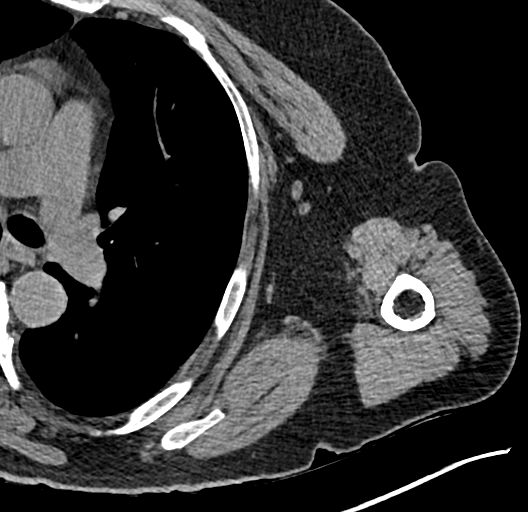
[im 15/95  bone]
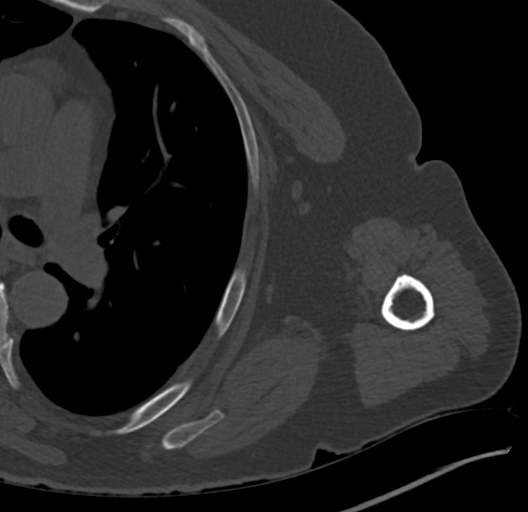
[im 29/95  bone]
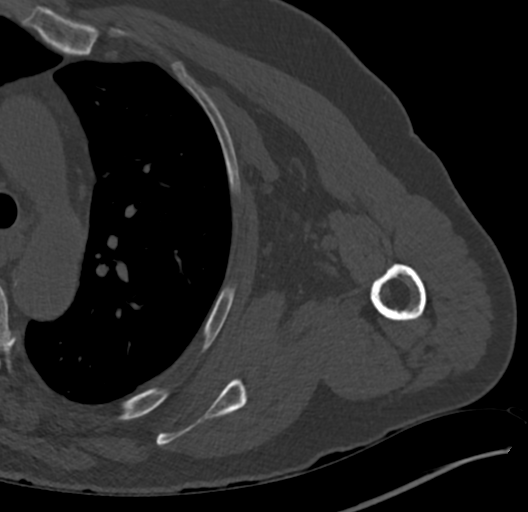
[im 51/95  bone]
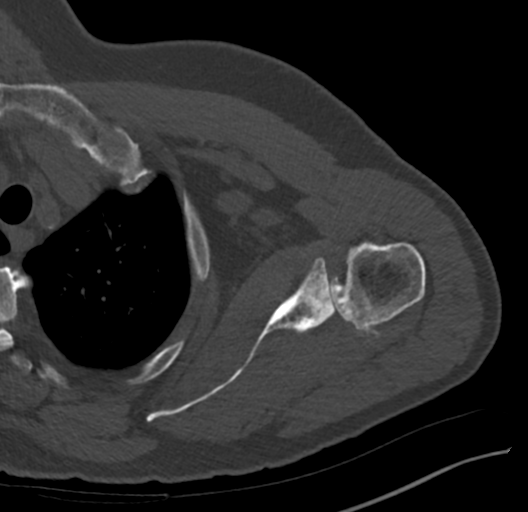
[im 66/95  bone]
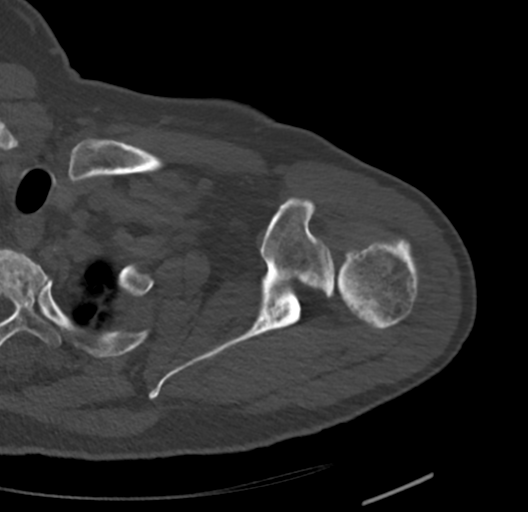
[im 80/95  soft-tissue]
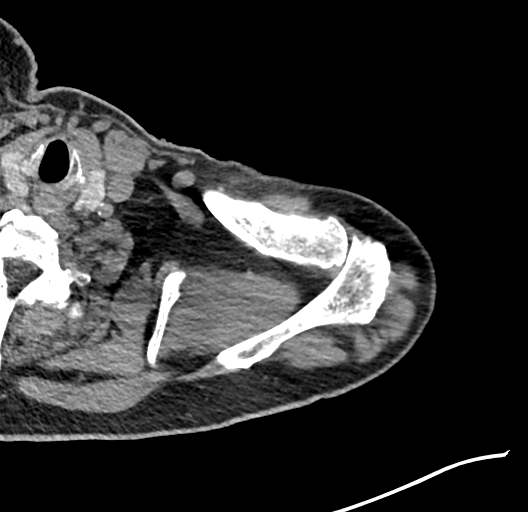
[im 80/95  bone]
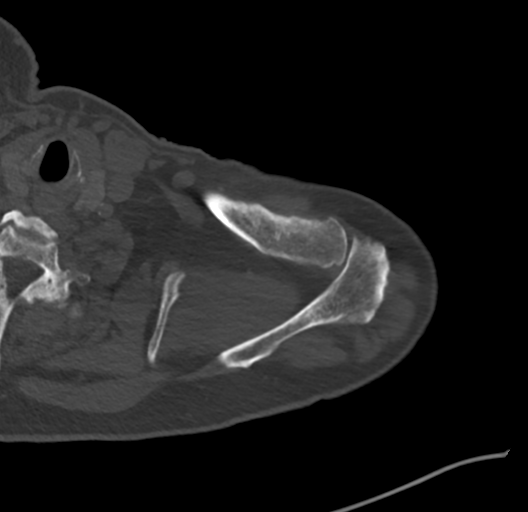

[Series 6: shoulder 2.00 br40 s3 cor soft · coronal · 0.37mm/px · 3 of 140 slices shown]
[im 28/140  bone]
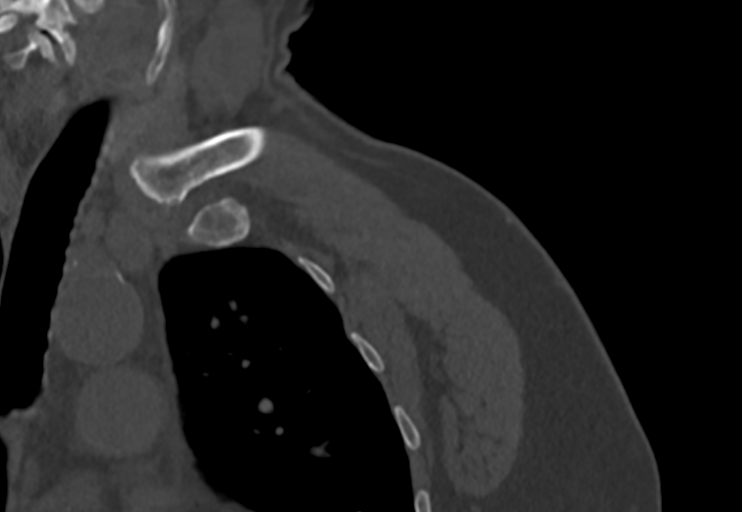
[im 56/140  bone]
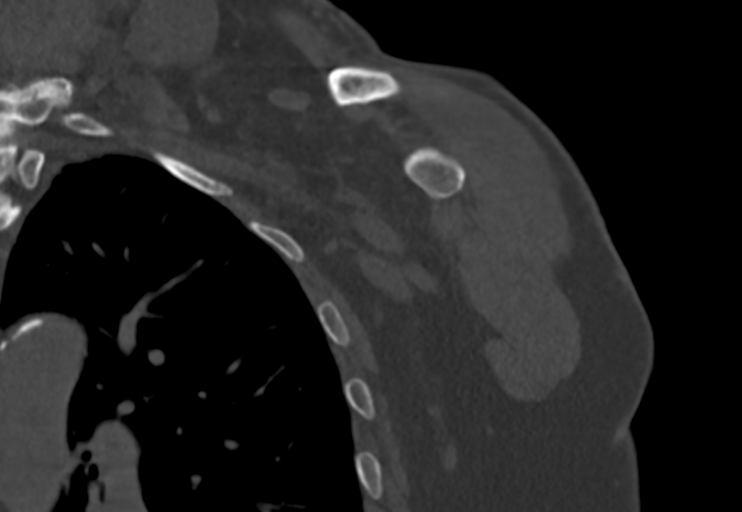
[im 84/140  bone]
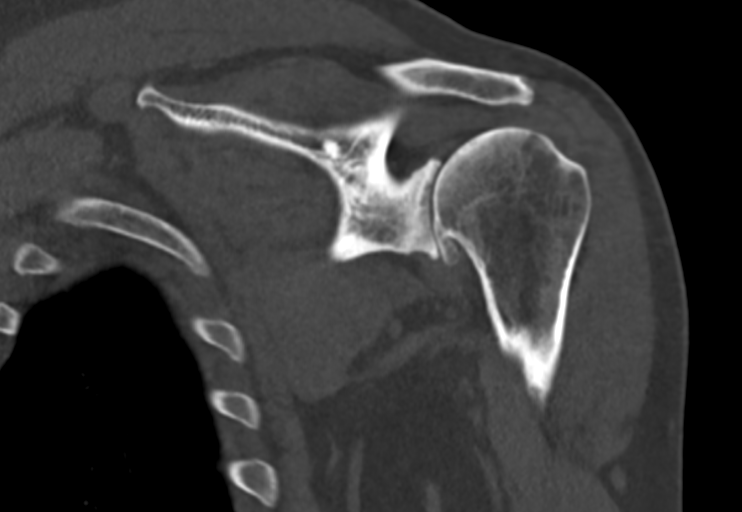

[Series 10: shoulder 2.00 br40 s3 sag soft · sagittal · 0.37mm/px · 5 of 138 slices shown, 6 images]
[im 46/138  bone]
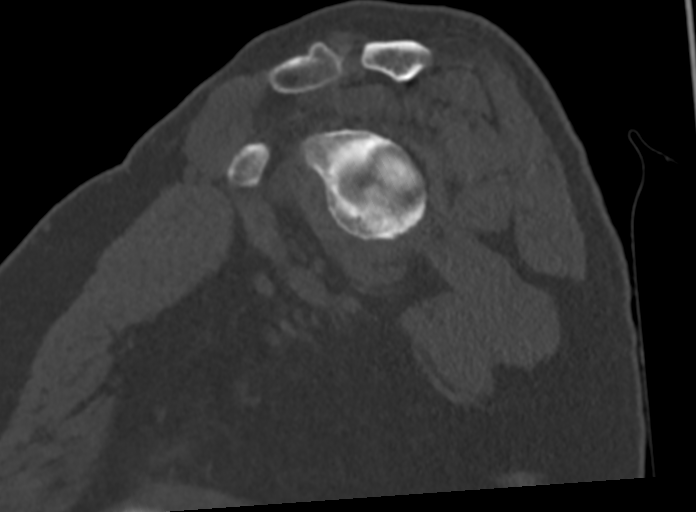
[im 58/138  bone]
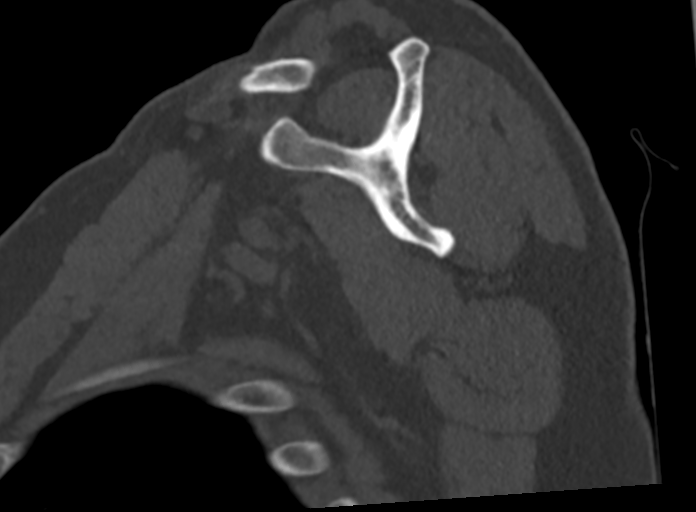
[im 69/138  soft-tissue]
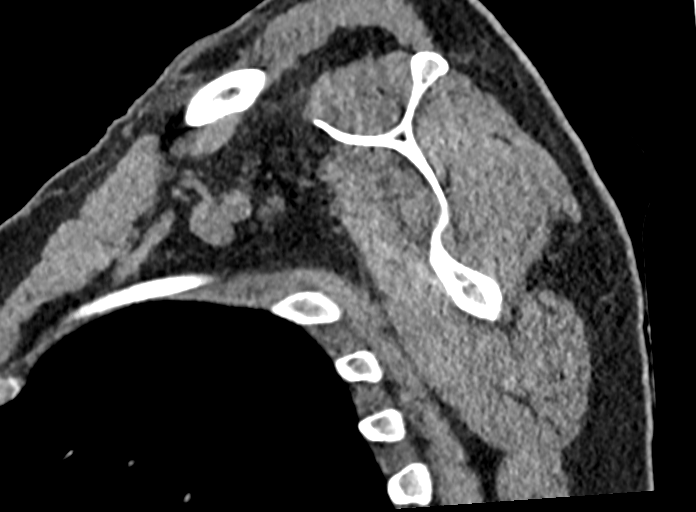
[im 69/138  bone]
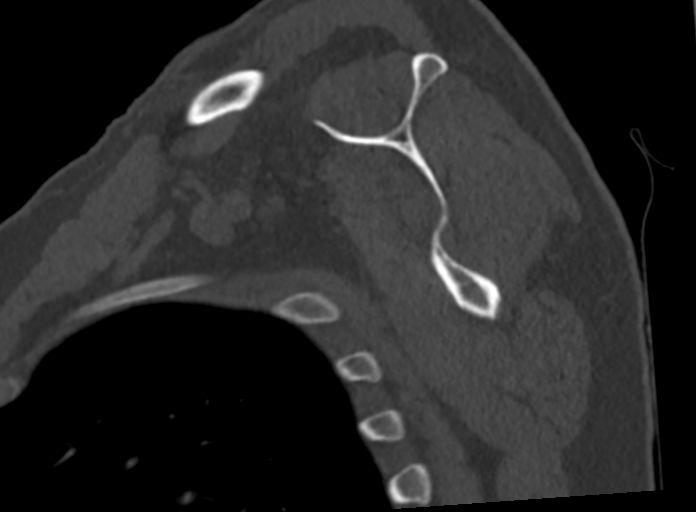
[im 80/138  bone]
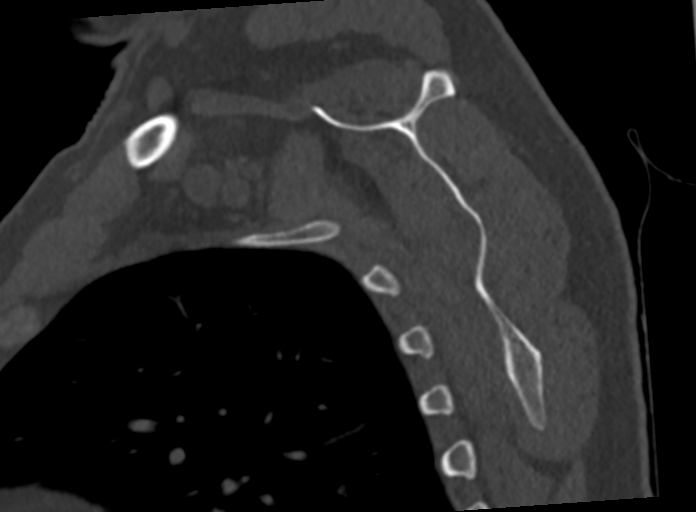
[im 92/138  bone]
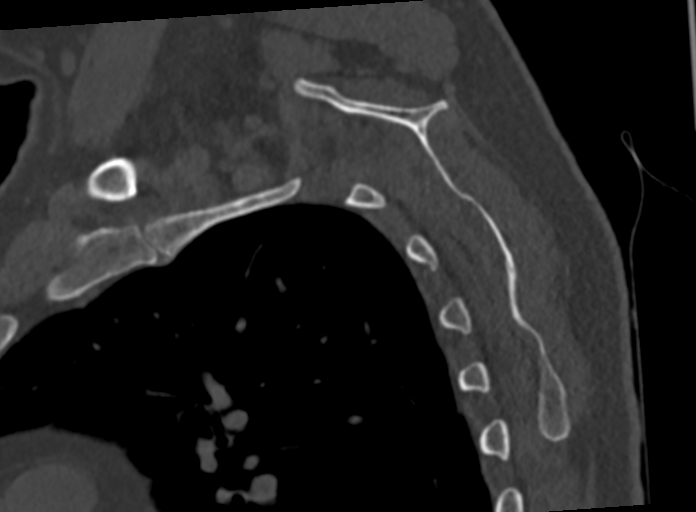

[13 of 34 positions shown; findings below may reference images not displayed]

FINDINGS: Bones/Joint/Cartilage

There is bone-on-bone glenohumeral joint space narrowing and a large
osteophyte off the humeral head. Sclerosis is seen in the posterior
glenoid and there is mild posterior subluxation of the humeral head
on the glenoid. No notable subchondral cyst formation about the
joint.

No acute bony or joint abnormality is seen. Mild-to-moderate
acromioclavicular osteoarthritis is noted. The acromion is type 2.
There is partial visualization of multilevel cervical and thoracic
degenerative change. No lytic or sclerotic lesion is identified.

Ligaments

Suboptimally assessed by CT.

Muscles and Tendons

Musculature of the shoulder girdle is preserved and the rotator cuff
appears intact.

Soft tissues

No fluid collection or mass. Imaged lung parenchyma is clear.
Extensive aortic and coronary atherosclerosis noted.
IMPRESSION: 1. Severe glenohumeral osteoarthritis with bone-on-bone glenohumeral
joint space narrowing and a large osteophyte off the humeral head.
Mild posterior subluxation of the humeral head on the glenoid is
suggestive of joint instability.
2. Mild-to-moderate the acromioclavicular osteoarthritis.
3. Calcific aortic coronary atherosclerosis.

Aortic Atherosclerosis ([id number]-QFH.H).

## 2021-05-31 IMAGING — DX DG SHOULDER 1V*L*
2 series · 2 of 2 positions shown · non-contrast
Comparison: 01/04/2019 left shoulder CT

CLINICAL DATA: Reverse total left shoulder arthroplasty

EXAM:
LEFT SHOULDER

[shoulder ap]
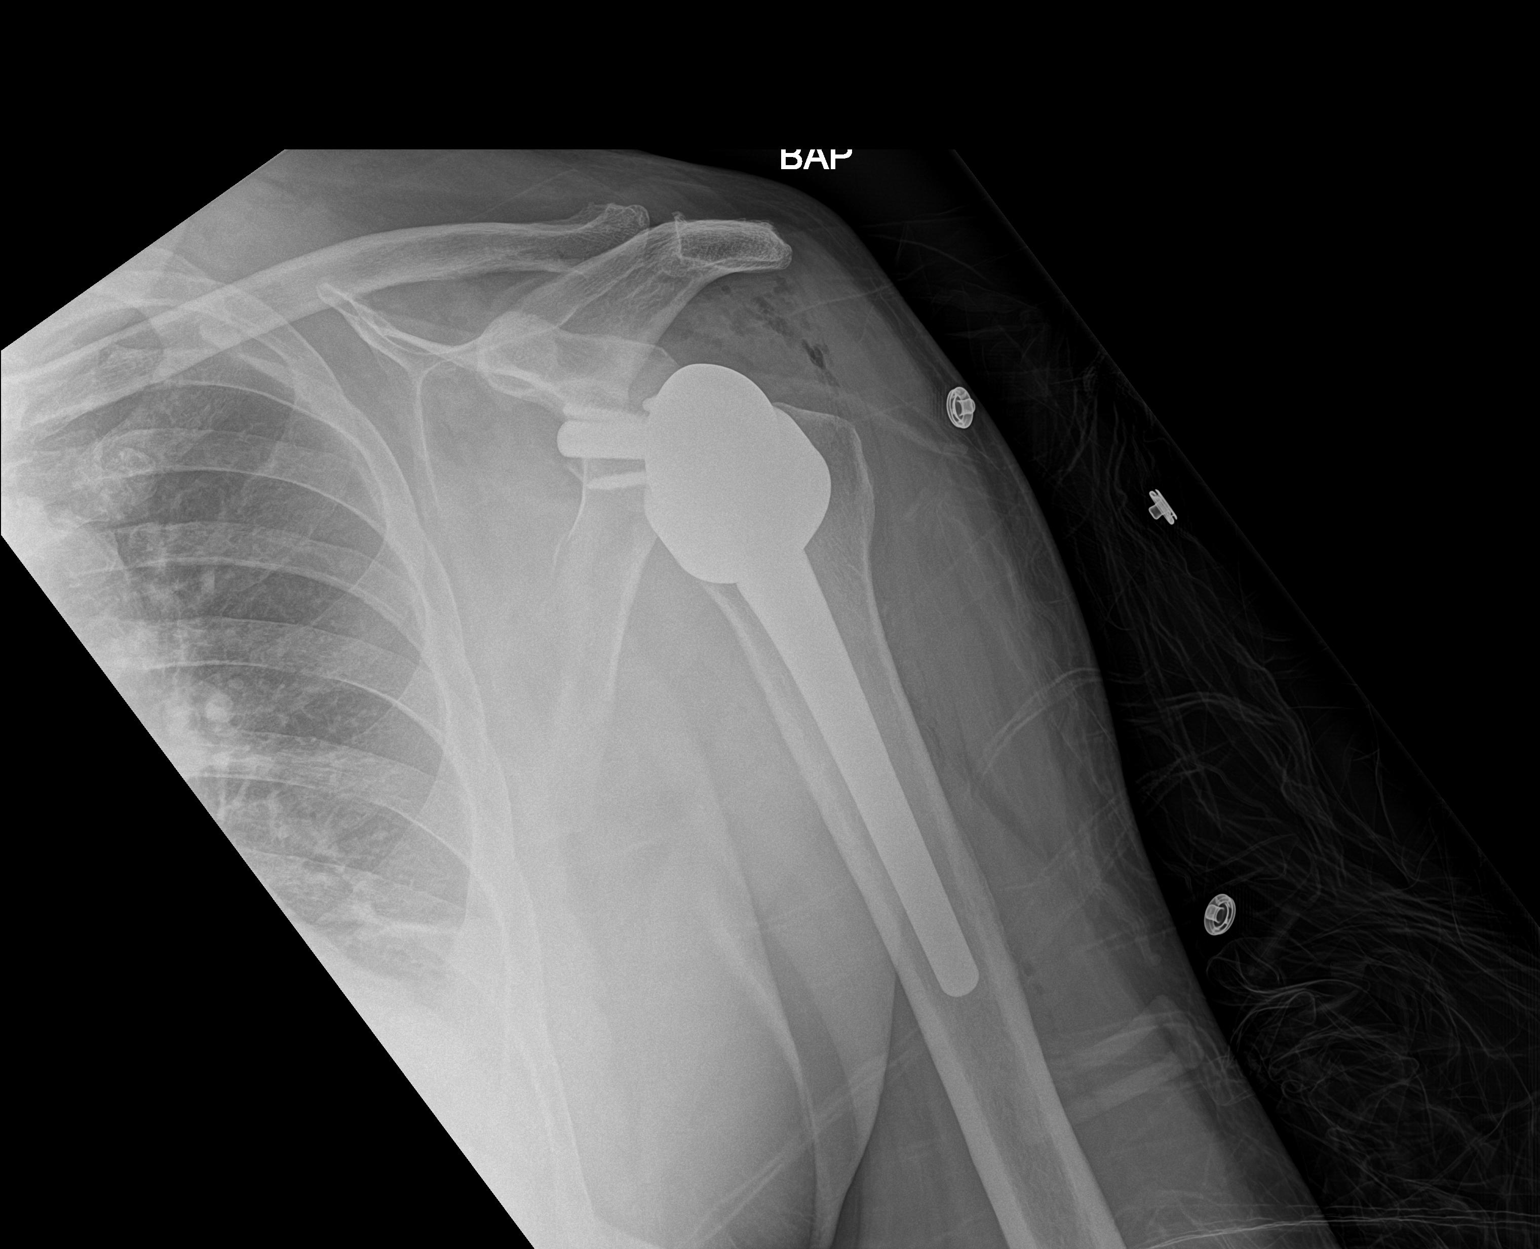

[shoulder obl]
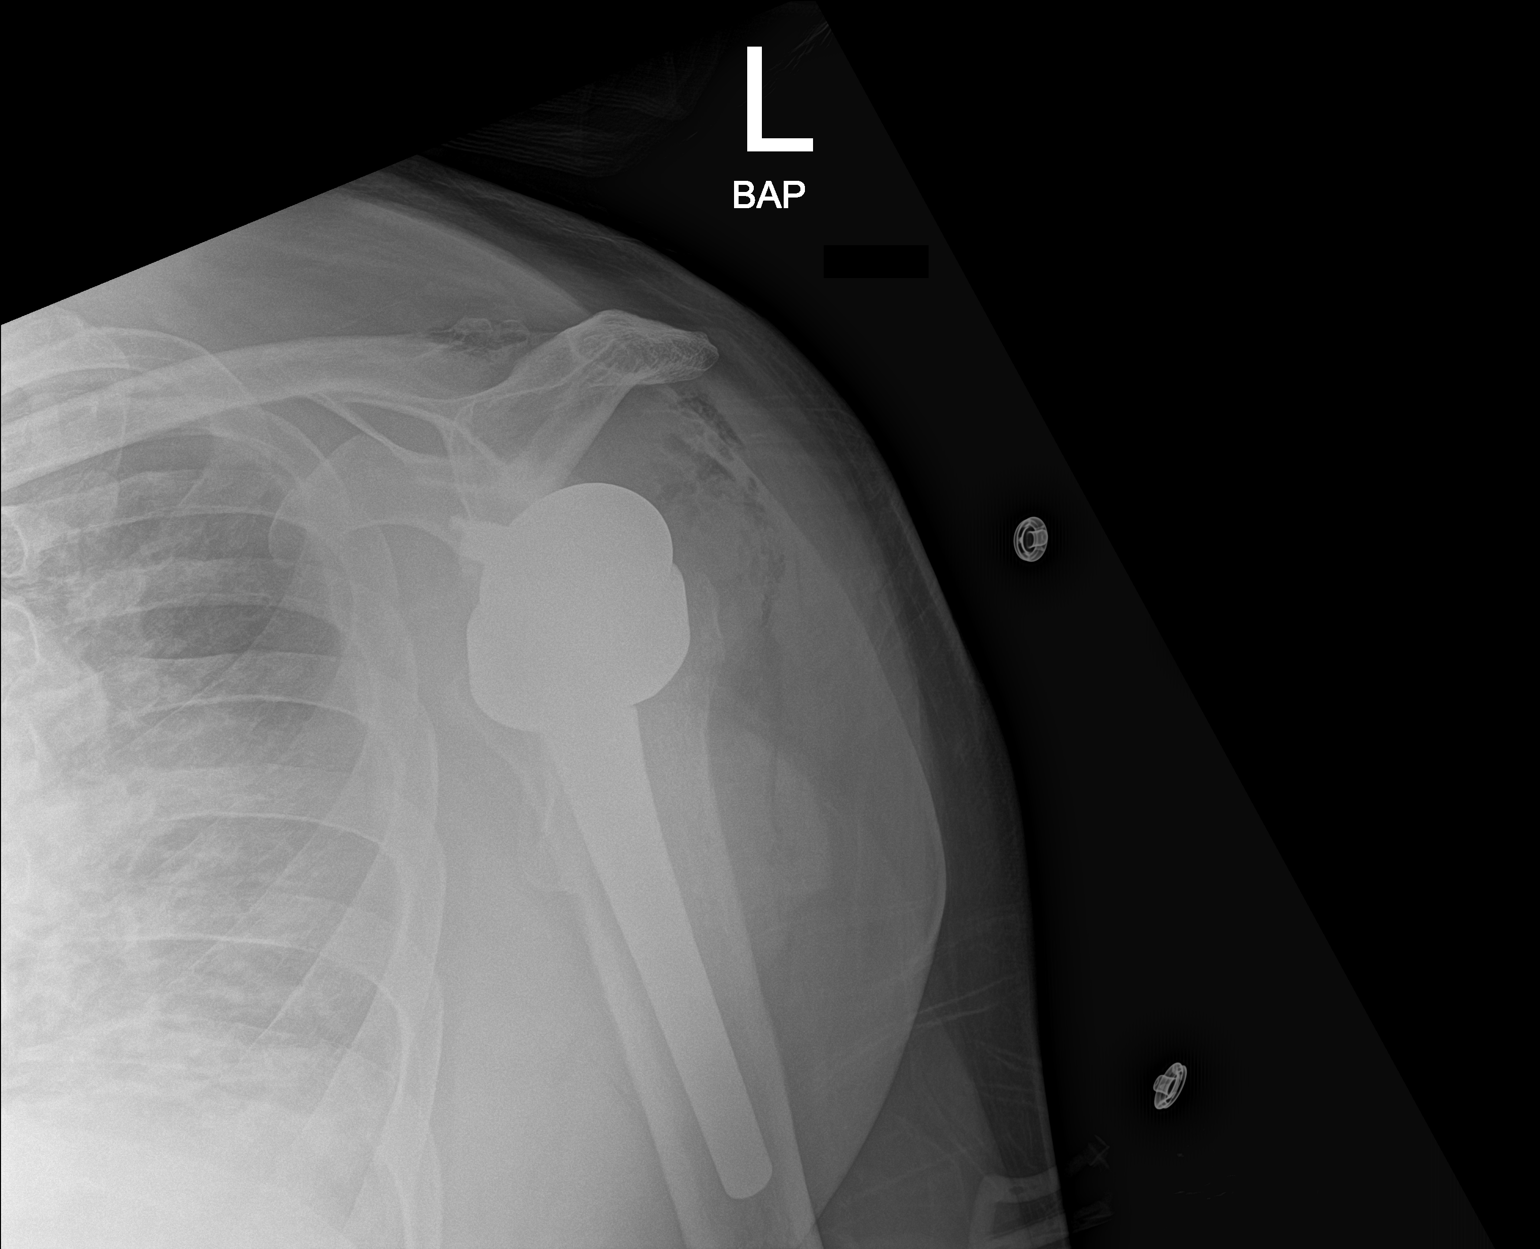

[2 of 2 positions shown; findings below may reference images not displayed]

FINDINGS: Interval reverse left total shoulder arthroplasty with no evidence
of hardware fracture or loosening and no dislocation at the
glenohumeral joint. No acromioclavicular offset. No acute osseous
fracture. No suspicious focal osseous lesions. Expected soft tissue
gas surrounding the left shoulder.
IMPRESSION: Satisfactory immediate postoperative appearance status post reverse
total left shoulder arthroplasty.

## 2021-06-20 DIAGNOSIS — E785 Hyperlipidemia, unspecified: Secondary | ICD-10-CM | POA: Diagnosis not present

## 2021-06-20 DIAGNOSIS — I10 Essential (primary) hypertension: Secondary | ICD-10-CM | POA: Diagnosis not present

## 2021-06-20 DIAGNOSIS — E669 Obesity, unspecified: Secondary | ICD-10-CM | POA: Diagnosis not present

## 2021-06-20 DIAGNOSIS — E1121 Type 2 diabetes mellitus with diabetic nephropathy: Secondary | ICD-10-CM | POA: Diagnosis not present

## 2021-07-02 DIAGNOSIS — M7061 Trochanteric bursitis, right hip: Secondary | ICD-10-CM | POA: Diagnosis not present

## 2021-07-02 DIAGNOSIS — M7062 Trochanteric bursitis, left hip: Secondary | ICD-10-CM | POA: Diagnosis not present

## 2021-07-04 DIAGNOSIS — M7062 Trochanteric bursitis, left hip: Secondary | ICD-10-CM | POA: Diagnosis not present

## 2021-07-05 DIAGNOSIS — D2271 Melanocytic nevi of right lower limb, including hip: Secondary | ICD-10-CM | POA: Diagnosis not present

## 2021-07-05 DIAGNOSIS — D225 Melanocytic nevi of trunk: Secondary | ICD-10-CM | POA: Diagnosis not present

## 2021-07-05 DIAGNOSIS — Z85828 Personal history of other malignant neoplasm of skin: Secondary | ICD-10-CM | POA: Diagnosis not present

## 2021-07-05 DIAGNOSIS — D692 Other nonthrombocytopenic purpura: Secondary | ICD-10-CM | POA: Diagnosis not present

## 2021-07-05 DIAGNOSIS — L218 Other seborrheic dermatitis: Secondary | ICD-10-CM | POA: Diagnosis not present

## 2021-07-05 DIAGNOSIS — L821 Other seborrheic keratosis: Secondary | ICD-10-CM | POA: Diagnosis not present

## 2021-07-06 ENCOUNTER — Encounter: Payer: Self-pay | Admitting: Podiatry

## 2021-07-06 ENCOUNTER — Ambulatory Visit (INDEPENDENT_AMBULATORY_CARE_PROVIDER_SITE_OTHER): Payer: Medicare PPO | Admitting: Podiatry

## 2021-07-06 DIAGNOSIS — E11610 Type 2 diabetes mellitus with diabetic neuropathic arthropathy: Secondary | ICD-10-CM

## 2021-07-06 DIAGNOSIS — D689 Coagulation defect, unspecified: Secondary | ICD-10-CM | POA: Diagnosis not present

## 2021-07-06 DIAGNOSIS — M79675 Pain in left toe(s): Secondary | ICD-10-CM

## 2021-07-06 DIAGNOSIS — M79674 Pain in right toe(s): Secondary | ICD-10-CM | POA: Diagnosis not present

## 2021-07-06 DIAGNOSIS — B351 Tinea unguium: Secondary | ICD-10-CM

## 2021-07-26 DIAGNOSIS — M545 Low back pain, unspecified: Secondary | ICD-10-CM | POA: Diagnosis not present

## 2021-07-26 DIAGNOSIS — M4316 Spondylolisthesis, lumbar region: Secondary | ICD-10-CM | POA: Diagnosis not present

## 2021-08-08 ENCOUNTER — Other Ambulatory Visit: Payer: Self-pay | Admitting: Orthopaedic Surgery

## 2021-08-08 DIAGNOSIS — M47892 Other spondylosis, cervical region: Secondary | ICD-10-CM

## 2021-08-13 ENCOUNTER — Ambulatory Visit
Admission: RE | Admit: 2021-08-13 | Discharge: 2021-08-13 | Disposition: A | Discharge status: Home / Self Care | Payer: Medicare PPO | Source: Ambulatory Visit | Attending: Orthopaedic Surgery | Admitting: Orthopaedic Surgery

## 2021-08-13 DIAGNOSIS — M4802 Spinal stenosis, cervical region: Secondary | ICD-10-CM | POA: Diagnosis not present

## 2021-08-13 DIAGNOSIS — M47812 Spondylosis without myelopathy or radiculopathy, cervical region: Secondary | ICD-10-CM | POA: Diagnosis not present

## 2021-08-13 DIAGNOSIS — M542 Cervicalgia: Secondary | ICD-10-CM | POA: Diagnosis not present

## 2021-08-13 DIAGNOSIS — M47892 Other spondylosis, cervical region: Secondary | ICD-10-CM

## 2021-08-15 DIAGNOSIS — E1140 Type 2 diabetes mellitus with diabetic neuropathy, unspecified: Secondary | ICD-10-CM | POA: Diagnosis not present

## 2021-08-15 DIAGNOSIS — M48062 Spinal stenosis, lumbar region with neurogenic claudication: Secondary | ICD-10-CM | POA: Diagnosis not present

## 2021-08-15 DIAGNOSIS — Z8546 Personal history of malignant neoplasm of prostate: Secondary | ICD-10-CM | POA: Diagnosis not present

## 2021-08-21 DIAGNOSIS — M545 Low back pain, unspecified: Secondary | ICD-10-CM | POA: Diagnosis not present

## 2021-08-23 DIAGNOSIS — M4316 Spondylolisthesis, lumbar region: Secondary | ICD-10-CM | POA: Diagnosis not present

## 2021-08-23 DIAGNOSIS — M545 Low back pain, unspecified: Secondary | ICD-10-CM | POA: Diagnosis not present

## 2021-08-30 ENCOUNTER — Other Ambulatory Visit: Payer: Self-pay | Admitting: Orthopaedic Surgery

## 2021-08-30 DIAGNOSIS — M4316 Spondylolisthesis, lumbar region: Secondary | ICD-10-CM

## 2021-08-30 DIAGNOSIS — M545 Low back pain, unspecified: Secondary | ICD-10-CM | POA: Diagnosis not present

## 2021-09-03 ENCOUNTER — Ambulatory Visit
Admission: RE | Admit: 2021-09-03 | Discharge: 2021-09-03 | Disposition: A | Discharge status: Home / Self Care | Payer: Medicare PPO | Source: Ambulatory Visit | Attending: Orthopaedic Surgery | Admitting: Orthopaedic Surgery

## 2021-09-03 DIAGNOSIS — M4316 Spondylolisthesis, lumbar region: Secondary | ICD-10-CM

## 2021-09-03 DIAGNOSIS — M545 Low back pain, unspecified: Secondary | ICD-10-CM | POA: Diagnosis not present

## 2021-09-03 DIAGNOSIS — M48061 Spinal stenosis, lumbar region without neurogenic claudication: Secondary | ICD-10-CM | POA: Diagnosis not present

## 2021-09-04 DIAGNOSIS — M545 Low back pain, unspecified: Secondary | ICD-10-CM | POA: Diagnosis not present

## 2021-09-07 DIAGNOSIS — R2689 Other abnormalities of gait and mobility: Secondary | ICD-10-CM | POA: Diagnosis not present

## 2021-09-07 DIAGNOSIS — M545 Low back pain, unspecified: Secondary | ICD-10-CM | POA: Diagnosis not present

## 2021-09-12 DIAGNOSIS — R2689 Other abnormalities of gait and mobility: Secondary | ICD-10-CM | POA: Diagnosis not present

## 2021-09-12 DIAGNOSIS — M545 Low back pain, unspecified: Secondary | ICD-10-CM | POA: Diagnosis not present

## 2021-09-14 DIAGNOSIS — R2689 Other abnormalities of gait and mobility: Secondary | ICD-10-CM | POA: Diagnosis not present

## 2021-09-14 DIAGNOSIS — M545 Low back pain, unspecified: Secondary | ICD-10-CM | POA: Diagnosis not present

## 2021-09-18 DIAGNOSIS — E1140 Type 2 diabetes mellitus with diabetic neuropathy, unspecified: Secondary | ICD-10-CM | POA: Diagnosis not present

## 2021-09-18 DIAGNOSIS — R2681 Unsteadiness on feet: Secondary | ICD-10-CM | POA: Diagnosis not present

## 2021-09-18 DIAGNOSIS — I739 Peripheral vascular disease, unspecified: Secondary | ICD-10-CM | POA: Diagnosis not present

## 2021-09-18 DIAGNOSIS — G4762 Sleep related leg cramps: Secondary | ICD-10-CM | POA: Diagnosis not present

## 2021-09-18 DIAGNOSIS — M48062 Spinal stenosis, lumbar region with neurogenic claudication: Secondary | ICD-10-CM | POA: Diagnosis not present

## 2021-09-18 DIAGNOSIS — I10 Essential (primary) hypertension: Secondary | ICD-10-CM | POA: Diagnosis not present

## 2021-09-18 DIAGNOSIS — E785 Hyperlipidemia, unspecified: Secondary | ICD-10-CM | POA: Diagnosis not present

## 2021-09-18 DIAGNOSIS — F5101 Primary insomnia: Secondary | ICD-10-CM | POA: Diagnosis not present

## 2021-09-19 DIAGNOSIS — C61 Malignant neoplasm of prostate: Secondary | ICD-10-CM | POA: Diagnosis not present

## 2021-09-19 DIAGNOSIS — M545 Low back pain, unspecified: Secondary | ICD-10-CM | POA: Diagnosis not present

## 2021-09-19 DIAGNOSIS — R2689 Other abnormalities of gait and mobility: Secondary | ICD-10-CM | POA: Diagnosis not present

## 2021-09-20 DIAGNOSIS — M4316 Spondylolisthesis, lumbar region: Secondary | ICD-10-CM | POA: Diagnosis not present

## 2021-09-21 DIAGNOSIS — M545 Low back pain, unspecified: Secondary | ICD-10-CM | POA: Diagnosis not present

## 2021-09-24 DIAGNOSIS — N393 Stress incontinence (female) (male): Secondary | ICD-10-CM | POA: Diagnosis not present

## 2021-09-24 DIAGNOSIS — N5201 Erectile dysfunction due to arterial insufficiency: Secondary | ICD-10-CM | POA: Diagnosis not present

## 2021-09-24 DIAGNOSIS — C61 Malignant neoplasm of prostate: Secondary | ICD-10-CM | POA: Diagnosis not present

## 2021-09-28 DIAGNOSIS — M545 Low back pain, unspecified: Secondary | ICD-10-CM | POA: Diagnosis not present

## 2021-09-28 DIAGNOSIS — R2689 Other abnormalities of gait and mobility: Secondary | ICD-10-CM | POA: Diagnosis not present

## 2021-10-01 DIAGNOSIS — M25551 Pain in right hip: Secondary | ICD-10-CM | POA: Diagnosis not present

## 2021-10-01 DIAGNOSIS — M25552 Pain in left hip: Secondary | ICD-10-CM | POA: Diagnosis not present

## 2021-10-03 DIAGNOSIS — M4316 Spondylolisthesis, lumbar region: Secondary | ICD-10-CM | POA: Diagnosis not present

## 2021-10-03 DIAGNOSIS — Z6834 Body mass index (BMI) 34.0-34.9, adult: Secondary | ICD-10-CM | POA: Diagnosis not present

## 2021-10-03 DIAGNOSIS — M48062 Spinal stenosis, lumbar region with neurogenic claudication: Secondary | ICD-10-CM | POA: Diagnosis not present

## 2021-10-10 ENCOUNTER — Ambulatory Visit (INDEPENDENT_AMBULATORY_CARE_PROVIDER_SITE_OTHER): Payer: Medicare PPO | Admitting: Podiatry

## 2021-10-10 ENCOUNTER — Encounter: Payer: Self-pay | Admitting: Podiatry

## 2021-10-10 DIAGNOSIS — E11610 Type 2 diabetes mellitus with diabetic neuropathic arthropathy: Secondary | ICD-10-CM | POA: Diagnosis not present

## 2021-10-10 DIAGNOSIS — B351 Tinea unguium: Secondary | ICD-10-CM

## 2021-10-10 DIAGNOSIS — M79674 Pain in right toe(s): Secondary | ICD-10-CM | POA: Diagnosis not present

## 2021-10-10 DIAGNOSIS — M25551 Pain in right hip: Secondary | ICD-10-CM | POA: Diagnosis not present

## 2021-10-10 DIAGNOSIS — M25552 Pain in left hip: Secondary | ICD-10-CM | POA: Diagnosis not present

## 2021-10-10 DIAGNOSIS — D689 Coagulation defect, unspecified: Secondary | ICD-10-CM | POA: Diagnosis not present

## 2021-10-10 DIAGNOSIS — M79675 Pain in left toe(s): Secondary | ICD-10-CM | POA: Diagnosis not present

## 2021-10-10 NOTE — Progress Notes (Signed)
This patient returns to my office for at risk foot care.  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  This patient is unable to cut nails himself since the patient cannot reach his nails.These nails are painful walking and wearing shoes.  This patient presents for at risk foot care today.  General Appearance  Alert, conversant and in no acute stress.  Vascular  Dorsalis pedis and posterior tibial  pulses are palpable  bilaterally.  Capillary return is within normal limits  bilaterally. Temperature is within normal limits  bilaterally.  Neurologic  Senn-Weinstein monofilament wire test within normal limits  bilaterally. Muscle power within normal limits bilaterally.  Nails Thick disfigured discolored nails with subungual debris  from hallux to fifth toes bilaterally. No evidence of bacterial infection or drainage bilaterally.  Orthopedic  No limitations of motion  feet .  No crepitus or effusions noted.  No bony pathology or digital deformities noted.  Asymptomatic  HAV.  Limited ROM STJ right foot.  Skin  normotropic skin with no porokeratosis noted bilaterally.  No signs of infections or ulcers noted.     Onychomycosis  Pain in right toes  Pain in left toes  Consent was obtained for treatment procedures.   Mechanical debridement of nails 1-5  bilaterally performed with a nail nipper.  Filed with dremel without incident.    Return office visit    3 months                  Told patient to return for periodic foot care and evaluation due to potential at risk complications.   Gardiner Barefoot DPM

## 2021-10-11 DIAGNOSIS — H40013 Open angle with borderline findings, low risk, bilateral: Secondary | ICD-10-CM | POA: Diagnosis not present

## 2021-10-11 DIAGNOSIS — H04123 Dry eye syndrome of bilateral lacrimal glands: Secondary | ICD-10-CM | POA: Diagnosis not present

## 2021-10-11 DIAGNOSIS — H35373 Puckering of macula, bilateral: Secondary | ICD-10-CM | POA: Diagnosis not present

## 2021-10-11 DIAGNOSIS — E119 Type 2 diabetes mellitus without complications: Secondary | ICD-10-CM | POA: Diagnosis not present

## 2021-10-16 ENCOUNTER — Encounter: Payer: Self-pay | Admitting: Neurology

## 2021-10-16 ENCOUNTER — Ambulatory Visit (INDEPENDENT_AMBULATORY_CARE_PROVIDER_SITE_OTHER): Payer: Medicare PPO | Admitting: Neurology

## 2021-10-16 VITALS — BP 143/78 | HR 77 | Ht 72.0 in | Wt 265.6 lb

## 2021-10-16 DIAGNOSIS — C61 Malignant neoplasm of prostate: Secondary | ICD-10-CM

## 2021-10-16 DIAGNOSIS — Z6836 Body mass index (BMI) 36.0-36.9, adult: Secondary | ICD-10-CM

## 2021-10-16 DIAGNOSIS — G63 Polyneuropathy in diseases classified elsewhere: Secondary | ICD-10-CM

## 2021-10-16 DIAGNOSIS — E349 Endocrine disorder, unspecified: Secondary | ICD-10-CM | POA: Insufficient documentation

## 2021-10-16 DIAGNOSIS — R351 Nocturia: Secondary | ICD-10-CM | POA: Diagnosis not present

## 2021-10-16 NOTE — Progress Notes (Signed)
SLEEP MEDICINE CLINIC    Provider:  Larey Seat, MD  Primary Care Physician:  Donnajean Lopes, [place] [address]     Referring Provider: Donnajean Lopes, [place],  [place] [address]          Chief Complaint according to patient   Patient presents with:     New Patient (Initial Visit)           HISTORY OF PRESENT ILLNESS:  Raymond Herring is a 71 y.o. year old White or Caucasian male patient seen here as a referral on 10/16/2021 from Dr Philip Aspen for a Sleep Medicine Consult .  Chief concern according to patient :  I go to PCP with DM regularly, obesity, prostate cancer and related Nocturia- every 90-120 minutes at night. "   I have the pleasure of seeing Raymond Herring today, a left-handed White or Caucasian male with a nocturia sleep disorder.  He   has a past medical history of Anxiety, Arthritis, Diabetes mellitus, Diverticulosis, Headache, Hyperlipidemia, Hypertension, Neuropathy, Prostate cancer ([place]), Sarcoidosis of central nervous system, and Skin cancer. Had covid twice - first time 2020. Repeat infection in 2021- treated with monoclonal AB 12-2019.   has  charcot marie tooth, Back problems and neuropathy, feet pain.   Sleep relevant medical history: Nocturia 3-4 times, dental implants.  Family medical /sleep history: No other family member on CPAP with OSA, insomnia, sleep walkers.  Social history:  Patient is retired from Associate Professor / Aeronautical engineer, Hotel manager, travelling 120 days a year-  and lives in a household with spouse, who snores.   with 4 adult children, 3 grandchildren.  Tobacco use: quit 40 years ago.  ETOH use - one drink a day,  Caffeine intake in form of Coffee( 2-3 a week) Soda( /) Tea ( /) or energy drinks.   Hobbies :hunting, fishing.       Sleep habits are as follows: The patient's dinner time is between 6-7 PM.  The patient goes to bed at 8-9 PM and asleep by 9.15-9.30 PM , he continues to  sleep for intervals of 2 hours, wakes for several bathroom breaks, the first time at 12 AM.   Waking up after 2-3 AM makes it harder to go to sleep again, TV on all night in the bedroom.   The preferred sleep position is  laterally, right., with the support of 2 pillows.  Dreams are reportedly  infrequent/ not vivid.   6-7 AM is the usual rise time. The patient wakes up spontaneously.  He reports not feeling refreshed or restored in AM, with symptoms( rarely) dry mouth, morning headaches, and residual fatigue. Naps are taken rarely / infrequently, lasting from 1-2 hours  while on hunting trips,  he takes no longer power naps.    Review of Systems: Out of a complete 14 system review, the patient complains of only the following symptoms, and all other reviewed systems are negative.:  Fatigue, sleepiness , snoring, fragmented sleep,  Insomnia    How likely are you to doze in the following situations: 0 = not likely, 1 = slight chance, 2 = moderate chance, 3 = high chance   Sitting and Reading? Watching Television? Sitting inactive in a public place (theater or meeting)? As a passenger in a car for an hour without a break? Lying down in the afternoon when circumstances permit? Sitting and talking to someone? Sitting quietly after lunch without alcohol? In  a car, while stopped for a few minutes in traffic?   Total = 6/ 24 points   FSS endorsed at 13/ 63 points.   Social History   Socioeconomic History   Marital status: Married    Spouse name: Adele   Number of children: 4   Years of education: 12   Highest education level: Not on file  Occupational History   Occupation: retired  Tobacco Use   Smoking status: Former    Packs/day: 2.00    Years: 25.00    Total pack years: 50.00    Types: Cigarettes    Quit date: 01/24/1981    Years since quitting: 40.7   Smokeless tobacco: Never  Vaping Use   Vaping Use: Never used  Substance and Sexual Activity   Alcohol use: Yes     Alcohol/week: 35.0 standard drinks of alcohol    Types: 35 Cans of beer per week    Comment: maybe 1 a day or less   Drug use: No   Sexual activity: Not Currently  Other Topics Concern   Not on file  Social History Narrative   Lives with Lou Miner (wife)   Caffeine use: 1 cup coffee or less per day   Left handed    Social Determinants of Health   Financial Resource Strain: Not on file  Food Insecurity: Not on file  Transportation Needs: Not on file  Physical Activity: Not on file  Stress: Not on file  Social Connections: Not on file    Family History  Problem Relation Age of Onset   Heart disease Father        Pacemaker   CVA Father    Alzheimer's disease Father    Colon cancer Neg Hx    Breast cancer Neg Hx    Pancreatic cancer Neg Hx    Prostate cancer Neg Hx     Past Medical History:  Diagnosis Date   Anxiety    Arthritis    Diabetes mellitus    Diverticulosis    Headache    Hyperlipidemia    Hypertension    Neuropathy    feet and fingers   Prostate cancer ([place])    Sarcoidosis of central nervous system    negatively affects gait   Skin cancer    Multiple areas removed. Managed by Dr. Lois Huxley.    Past Surgical History:  Procedure Laterality Date   APPENDECTOMY     KNEE CARTILAGE SURGERY Bilateral 0272,5366   right and left knee   LAPAROSCOPIC APPENDECTOMY  2014   LYMPHADENECTOMY Bilateral 06/17/2018   Procedure: LYMPHADENECTOMY;  Surgeon: Alexis Frock, MD;  Location: [facility];  Service: Urology;  Laterality: Bilateral;   REVERSE SHOULDER ARTHROPLASTY Left 02/02/2019   Procedure: REVERSE SHOULDER ARTHROPLASTY;  Surgeon: Nicholes Stairs, MD;  Location: [facility];  Service: Orthopedics;  Laterality: Left;  2.5hrs   ROBOT ASSISTED LAPAROSCOPIC RADICAL PROSTATECTOMY N/A 06/17/2018   Procedure: XI ROBOTIC ASSISTED LAPAROSCOPIC RADICAL PROSTATECTOMY;  Surgeon: Alexis Frock, MD;  Location: [facility];  Service: Urology;  Laterality: N/A;  3 HRS   TOTAL KNEE  ARTHROPLASTY  01/30/2011   Procedure: TOTAL KNEE ARTHROPLASTY;  Surgeon: Tobi Bastos, MD;  Location: [facility];  Service: Orthopedics;  Laterality: Right;   TOTAL KNEE ARTHROPLASTY Left 01/31/2016   Procedure: LEFT TOTAL KNEE ARTHROPLASTY;  Surgeon: Latanya Maudlin, MD;  Location: [facility];  Service: Orthopedics;  Laterality: Left;     Current Outpatient Medications on File Prior to Visit  Medication Sig  Dispense Refill   acetaminophen (TYLENOL) 500 MG tablet 650 mg. Patient states that he takes '650mg'$  4 times per day     allopurinol (ZYLOPRIM) 300 MG tablet Take 300 mg by mouth daily.  12   amLODipine (NORVASC) 2.5 MG tablet Take 2.5 mg by mouth daily.     Ascorbic Acid (VITAMIN C PO) Take 3,000 mg by mouth daily.     celecoxib (CELEBREX) 200 MG capsule Take 200 mg by mouth 2 (two) times daily.     cephALEXin (KEFLEX) 500 MG capsule Take 2,000 mg by mouth See admin instructions. Take 2000 mg 1 hour prior to dental work     Cholecalciferol (DIALYVITE VITAMIN D 5000) 125 MCG (5000 UT) capsule Take 5,000 Units by mouth daily.     cilostazol (PLETAL) 100 MG tablet Take 100 mg by mouth daily.     clobetasol (OLUX) 0.05 % topical foam Apply 1 application topically daily as needed (irritation).      colchicine 0.6 MG tablet Colcrys 0.6 mg tablet   0.6 mg by oral route.     Cyanocobalamin (B-12) 3000 MCG CAPS Take 3,000 mcg by mouth daily.     fluorometholone (FML) 0.1 % ophthalmic suspension SMARTSIG:In Eye(s)     gabapentin (NEURONTIN) 300 MG capsule Take 300-600 mg by mouth See admin instructions. Take 300 mg in the morning and 300 mg at night, may increase night dose to 600 mg as needed for pain     Hypromellose 0.3 % SOLN Place 1-2 drops into both eyes 3 (three) times daily as needed (for dry eyes). RETAINE HPMC 0.3%     Lancets (ONETOUCH DELICA PLUS [id number]) MISC SMARTSIG:Topical 2-4 Times Daily     losartan (COZAAR) 100 MG tablet Take 100 mg by mouth daily.     metFORMIN (GLUCOPHAGE) 500 MG  tablet Take 500 mg by mouth 2 (two) times daily.      Multiple Vitamin (MULTIVITAMIN WITH MINERALS) TABS tablet Take 1 tablet by mouth daily.     olmesartan (BENICAR) 20 MG tablet Take 20 mg by mouth daily.     ONE TOUCH ULTRA TEST test strip      pravastatin (PRAVACHOL) 20 MG tablet Take 20 mg by mouth daily.     saccharomyces boulardii (FLORASTOR) 250 MG capsule Take 250 mg by mouth 2 (two) times daily.     Semaglutide, 1 MG/DOSE, (OZEMPIC, 1 MG/DOSE,) 4 MG/3ML SOPN Inject 1 mg into the skin once a week.     Wheat Dextrin (BENEFIBER DRINK MIX PO) Take 1 Dose by mouth daily.      zinc gluconate 50 MG tablet Take 50 mg by mouth daily.     zolpidem (AMBIEN) 10 MG tablet Take 10 mg by mouth at bedtime as needed for sleep.      No current facility-administered medications on file prior to visit.    No Known Allergies  Physical exam:  Today's Vitals   10/16/21 1248  BP: (!) 143/78  Pulse: 77  SpO2: 99%  Weight: 265 lb 9.6 oz (120.5 kg)  Height: 6' (1.829 m)   Body mass index is 36.02 kg/m.   Wt Readings from Last 3 Encounters:  10/16/21 265 lb 9.6 oz (120.5 kg)  07/27/19 278 lb 12.8 oz (126.5 kg)  02/02/19 260 lb (117.9 kg)     Ht Readings from Last 3 Encounters:  10/16/21 6' (1.829 m)  07/27/19 '5\' 11"'$  (1.803 m)  02/02/19 '5\' 11"'$  (1.803 m)      General: The  patient is awake, alert and appears not in acute distress, appears relaxed.  The patient is well groomed. Postnasal drip.  Head: Normocephalic, atraumatic. Neck is supple. Mallampati 2-3,  neck circumference: 20 inches . Nasal airflow  patent.   Retrognathia is not seen. Facial hair.  Dental status: biological  Cardiovascular:  Regular rate and cardiac rhythm by pulse,  without distended neck veins. Respiratory: Lungs are clear.  Skin:  Without evidence of ankle edema, or rash. Trunk: The patient's posture is erect.   Neurologic exam : The patient is awake and alert, oriented to place and time.   Memory subjective  described as intact.  Attention span & concentration ability appears normal.  Speech is fluent,  without  dysarthria, dysphonia or aphasia.  Mood and affect are appropriate.   Cranial nerves: no loss of smell or taste reported  Pupils are equal and briskly reactive to light. Funduscopic exam deferred. .  Extraocular movements in vertical and horizontal planes were intact and without nystagmus. No Diplopia. Visual fields by finger perimetry are intact. Hearing was intact to soft voice and finger rubbing.    Facial sensation intact to fine touch.  Facial motor strength is symmetric and tongue and uvula move midline.  Neck ROM : rotation, tilt and flexion extension were normal for age and shoulder shrug was symmetrical.    Motor exam:  Symmetric bulk, tone and ROM.   Normal tone without cog wheeling, symmetric grip strength .   Sensory:  Fine touch, pinprick and vibration were abnormal, numbness extend though feet, ankles and up to patella. .  Proprioception tested in the upper extremities was normal.   Coordination: Rapid alternating movements in the fingers/hands were of normal speed.  The Finger-to-nose maneuver was intact without evidence of ataxia, dysmetria or tremor.   Gait and station: Patient could rise unassisted from a seated position, walked without assistive device.  Stance is of normal width/ base and the patient turned with 3 steps.  Toe and heel walk were deferred.  Deep tendon reflexes: in the  upper and lower extremities are symmetrically attenuated and intact.  Bilateral knee replacement- 2013. Babinski response was deferred .       After spending a total time of  40  minutes face to face and additional time for physical and neurologic examination, review of laboratory studies,  personal review of imaging studies, reports and results of other testing and review of referral information / records as far as provided in visit, I have established the following  assessments:  1) nocturia  - frequent since prostate cancer Gleeson score 4, 38 radiations and prostatectomy.  2) the patient keeps busy all the time if he is not fatigued according to his fatigue severity score he is not excessively daytime sleepy either with a six-point score on Epworth Sleepiness Scale.  The geriatric depression score was endorsed at 2 out of 15 points and is not indicative of clinical depression. 3) there is a longer standing history of diabetic neuropathy, spinal stenosis and degenerative spine disease and Charcot-Marie-Tooth  and GOUT condition.  The patient reports that he is restless during the day he does not have specifically the irresistible urge to move at bedtime or while at rest. 4) physical risk risk factors for OSA are present, BMI, neck size, Mallampati, postnasal drip, phlegm  5) cervical spine DDD.   My Plan is to proceed with:  1) patient will be screened for OSA by [facility] or may come in under [facility] for  SPLIT night .  2) Split night protocol would be split at [facility] 20/h.  3) patient is seeing Dr. Zada Finders for spinal stenosis.  4) he should be avoiding TV at night.   I would like to thank  Donnajean Lopes, Md [address] [place],  [place] [address] for allowing me to meet with and to take care of this pleasant patient.   In short, Terryl Molinelli Janczak is presenting with non restorative sleep and risk factors for OSA but mainly with nocturia related sleep interruption. He is habitually a short sleeper, since his youth, less than 6 hours. I plan to follow up either personally or through our NP within 2-4 months.   CC: I will share my notes with PCP .  Electronically signed by: Larey Seat, MD 10/16/2021 1:20 PM  [facility] certified by The [employer] of Sleep Medicine and Diplomate of the [employer] of Sleep Medicine. Board certified In Neurology through the [employer], Fellow of the [employer] of  Neurology. Medical Director of [employer].

## 2021-10-16 NOTE — Patient Instructions (Signed)
After spending a total time of  40  minutes face to face and additional time for physical and neurologic examination, review of laboratory studies,  personal review of imaging studies, reports and results of other testing and review of referral information / records as far as provided in visit, I have established the following assessments:  1) nocturia  - frequent since prostate cancer Gleeson score 4, 38 radiations and prostatectomy.  2) the patient keeps busy all the time if he is not fatigued according to his fatigue severity score he is not excessively daytime sleepy either with a six-point score on Epworth Sleepiness Scale.  The geriatric depression score was endorsed at 2 out of 15 points and is not indicative of clinical depression. 3) there is a longer standing history of diabetic neuropathy, spinal stenosis and degenerative spine disease and Charcot-Marie-Tooth  and GOUT condition.  The patient reports that he is restless during the day he does not have specifically the irresistible urge to move at bedtime or while at rest. 4) physical risk risk factors for OSA are present, BMI, neck size, Mallampati, postnasal drip, phlegm  5) cervical spine DDD.   My Plan is to proceed with:  1) patient will be screened for OSA by HST or may come in under [facility]are for SPLIT night .  2) Split night protocol would be split at [facility] 20/h.  3) patient is seeing Dr. Zada Finders for spinal stenosis.  4) he should be avoiding TV at night.

## 2021-10-17 DIAGNOSIS — M25551 Pain in right hip: Secondary | ICD-10-CM | POA: Diagnosis not present

## 2021-10-17 DIAGNOSIS — M25552 Pain in left hip: Secondary | ICD-10-CM | POA: Diagnosis not present

## 2021-10-31 DIAGNOSIS — M25552 Pain in left hip: Secondary | ICD-10-CM | POA: Diagnosis not present

## 2021-10-31 DIAGNOSIS — M25551 Pain in right hip: Secondary | ICD-10-CM | POA: Diagnosis not present

## 2021-11-12 DIAGNOSIS — M160 Bilateral primary osteoarthritis of hip: Secondary | ICD-10-CM | POA: Diagnosis not present

## 2021-11-14 DIAGNOSIS — M4316 Spondylolisthesis, lumbar region: Secondary | ICD-10-CM | POA: Diagnosis not present

## 2021-11-30 ENCOUNTER — Other Ambulatory Visit: Payer: Self-pay | Admitting: Neurological Surgery

## 2022-01-02 ENCOUNTER — Encounter (HOSPITAL_COMMUNITY): Payer: Self-pay

## 2022-01-02 NOTE — Pre-Procedure Instructions (Signed)
Surgical Instructions    Your procedure is scheduled on Thursday, January 4th.  Report to [facility] Main Entrance "A" at 05:30 A.M., then check in with the Admitting office.  Call this number if you have problems the morning of surgery:  [phone]   If you have any questions prior to your surgery date call [phone]: Open Monday-Friday 8am-4pm    Remember:  Do not eat after midnight the night before your surgery  You may drink clear liquids until 04:30 AM the morning of your surgery.   Clear liquids allowed are: Water, Non-Citrus Juices (without pulp), Carbonated Beverages, Clear Tea, Black Coffee Only (NO MILK, CREAM OR POWDERED CREAMER of any kind), and Gatorade.    Take these medicines the morning of surgery with A SIP OF WATER  acetaminophen (TYLENOL)  allopurinol (ZYLOPRIM)  amLODipine (NORVASC)  fluorometholone (FML) ophthalmic suspension  gabapentin (NEURONTIN)  Hypromellose SOLN  pravastatin (PRAVACHOL)    If needed: colchicine    Follow your surgeon's instructions on when to stop cilostazol (PLETAL).  If no instructions were given by your surgeon then you will need to call the office to get those instructions.     As of today, STOP taking any Aspirin (unless otherwise instructed by your surgeon) Aleve, Naproxen, Ibuprofen, Motrin, Advil, Goody's, BC's, all herbal medications, fish oil, and all vitamins. This includes celecoxib (CELEBREX) and diclofenac Sodium (VOLTAREN) gel.  WHAT DO I DO ABOUT MY DIABETES MEDICATION?   Do not take metFORMIN (GLUCOPHAGE) the morning of surgery.  HOLD SEMAGLUTIDE FOR 7 DAYS PRIOR TO SURGERY         HOW TO MANAGE YOUR DIABETES BEFORE AND AFTER SURGERY  Why is it important to control my blood sugar before and after surgery? Improving blood sugar levels before and after surgery helps healing and can limit problems. A way of improving blood sugar control is eating a healthy diet by:  Eating less sugar and carbohydrates   Increasing activity/exercise  Talking with your doctor about reaching your blood sugar goals High blood sugars (greater than 180 mg/dL) can raise your risk of infections and slow your recovery, so you will need to focus on controlling your diabetes during the weeks before surgery. Make sure that the doctor who takes care of your diabetes knows about your planned surgery including the date and location.  How do I manage my blood sugar before surgery? Check your blood sugar at least 4 times a day, starting 2 days before surgery, to make sure that the level is not too high or low.  Check your blood sugar the morning of your surgery when you wake up and every 2 hours until you get to the Short Stay unit.  If your blood sugar is less than 70 mg/dL, you will need to treat for low blood sugar: Do not take insulin. Treat a low blood sugar (less than 70 mg/dL) with  cup of clear juice (cranberry or apple), 4 glucose tablets, OR glucose gel. Recheck blood sugar in 15 minutes after treatment (to make sure it is greater than 70 mg/dL). If your blood sugar is not greater than 70 mg/dL on recheck, call [phone] for further instructions. Report your blood sugar to the short stay nurse when you get to Short Stay.  If you are admitted to the hospital after surgery: Your blood sugar will be checked by the staff and you will probably be given insulin after surgery (instead of oral diabetes medicines) to make sure you have good blood sugar  levels. The goal for blood sugar control after surgery is 80-180 mg/dL.                     Do NOT Smoke (Tobacco/Vaping) for 24 hours prior to your procedure.  If you use a CPAP at night, you may bring your mask/headgear for your overnight stay.   Contacts, glasses, piercing's, hearing aid's, dentures or partials may not be worn into surgery, please bring cases for these belongings.    For patients admitted to the hospital, discharge time will be determined by your  treatment team.   Patients discharged the day of surgery will not be allowed to drive home, and someone needs to stay with them for 24 hours.  SURGICAL WAITING ROOM VISITATION Patients having surgery or a procedure may have no more than 2 support people in the waiting area - these visitors may rotate.   Children under the age of 92 must have an adult with them who is not the patient. If the patient needs to stay at the hospital during part of their recovery, the visitor guidelines for inpatient rooms apply. Pre-op nurse will coordinate an appropriate time for 1 support person to accompany patient in pre-op.  This support person may not rotate.   Please refer to the [facility] website for the visitor guidelines for Inpatients (after your surgery is over and you are in a regular room).    Special instructions:   [facility]- Preparing For Surgery  Before surgery, you can play an important role. Because skin is not sterile, your skin needs to be as free of germs as possible. You can reduce the number of germs on your skin by washing with CHG (chlorahexidine gluconate) Soap before surgery.  CHG is an antiseptic cleaner which kills germs and bonds with the skin to continue killing germs even after washing.    Oral Hygiene is also important to reduce your risk of infection.  Remember - BRUSH YOUR TEETH THE MORNING OF SURGERY WITH YOUR REGULAR TOOTHPASTE  Please do not use if you have an allergy to CHG or antibacterial soaps. If your skin becomes reddened/irritated stop using the CHG.  Do not shave (including legs and underarms) for at least 48 hours prior to first CHG shower. It is OK to shave your face.  Please follow these instructions carefully.   Shower the NIGHT BEFORE SURGERY and the MORNING OF SURGERY  If you chose to wash your hair, wash your hair first as usual with your normal shampoo.  After you shampoo, rinse your hair and body thoroughly to remove the shampoo.  Use CHG Soap as  you would any other liquid soap. You can apply CHG directly to the skin and wash gently with a scrungie or a clean washcloth.   Apply the CHG Soap to your body ONLY FROM THE NECK DOWN.  Do not use on open wounds or open sores. Avoid contact with your eyes, ears, mouth and genitals (private parts). Wash Face and genitals (private parts)  with your normal soap.   Wash thoroughly, paying special attention to the area where your surgery will be performed.  Thoroughly rinse your body with warm water from the neck down.  DO NOT shower/wash with your normal soap after using and rinsing off the CHG Soap.  Pat yourself dry with a CLEAN TOWEL.  Wear CLEAN PAJAMAS to bed the night before surgery  Place CLEAN SHEETS on your bed the night before your surgery  DO NOT  SLEEP WITH PETS.   Day of Surgery: Take a shower with CHG soap. Do not wear jewelry  Do not wear lotions, powders, colognes, or deodorant. Men may shave face and neck. Do not bring valuables to the hospital. [facility] is not responsible for any belongings or valuables.  Wear Clean/Comfortable clothing the morning of surgery Remember to brush your teeth WITH YOUR REGULAR TOOTHPASTE.   Please read over the following fact sheets that you were given.    If you received a COVID test during your pre-op visit  it is requested that you wear a mask when out in public, stay away from anyone that may not be feeling well and notify your surgeon if you develop symptoms. If you have been in contact with anyone that has tested positive in the last 10 days please notify you surgeon.

## 2022-01-03 ENCOUNTER — Other Ambulatory Visit: Payer: Self-pay

## 2022-01-03 ENCOUNTER — Encounter (HOSPITAL_COMMUNITY): Payer: Self-pay

## 2022-01-03 ENCOUNTER — Encounter (HOSPITAL_COMMUNITY)
Admission: RE | Admit: 2022-01-03 | Discharge: 2022-01-03 | Disposition: A | Discharge status: Home / Self Care | Payer: Medicare PPO | Source: Ambulatory Visit | Attending: Neurological Surgery | Admitting: Neurological Surgery

## 2022-01-03 VITALS — BP 134/84 | HR 78 | Temp 97.7°F | Resp 18 | Ht 73.0 in | Wt 258.8 lb

## 2022-01-03 DIAGNOSIS — Z01818 Encounter for other preprocedural examination: Secondary | ICD-10-CM | POA: Insufficient documentation

## 2022-01-03 DIAGNOSIS — E1149 Type 2 diabetes mellitus with other diabetic neurological complication: Secondary | ICD-10-CM | POA: Diagnosis not present

## 2022-01-03 HISTORY — DX: Umbilical hernia without obstruction or gangrene: K42.9

## 2022-01-03 LAB — SURGICAL PCR SCREEN
MRSA, PCR: POSITIVE — AB
Staphylococcus aureus: POSITIVE — AB

## 2022-01-03 LAB — BASIC METABOLIC PANEL
Anion gap: 7 (ref 5–15)
BUN: 25 mg/dL — ABNORMAL HIGH (ref 8–23)
CO2: 23 mmol/L (ref 22–32)
Calcium: 10.3 mg/dL (ref 8.9–10.3)
Chloride: 107 mmol/L (ref 98–111)
Creatinine, Ser: 1.17 mg/dL (ref 0.61–1.24)
GFR, Estimated: >60 mL/min (ref >60)
Glucose, Bld: 91 mg/dL (ref 70–99)
Potassium: 5.3 mmol/L — ABNORMAL HIGH (ref 3.5–5.1)
Sodium: 137 mmol/L (ref 135–145)

## 2022-01-03 LAB — CBC
HCT: 37.3 % — ABNORMAL LOW (ref 39.0–52.0)
Hemoglobin: 12.3 g/dL — ABNORMAL LOW (ref 13.0–17.0)
MCH: 33.6 pg (ref 26.0–34.0)
MCHC: 33 g/dL (ref 30.0–36.0)
MCV: 101.9 fL — ABNORMAL HIGH (ref 80.0–100.0)
Platelets: 187 10*3/uL (ref 150–400)
RBC: 3.66 MIL/uL — ABNORMAL LOW (ref 4.22–5.81)
RDW: 13 % (ref 11.5–15.5)
WBC: 6.1 10*3/uL (ref 4.0–10.5)
nRBC: 0 % (ref 0.0–0.2)

## 2022-01-03 LAB — TYPE AND SCREEN
ABO/RH(D): B POS
Antibody Screen: NEGATIVE

## 2022-01-03 LAB — GLUCOSE, CAPILLARY: Glucose-Capillary: 140 mg/dL — ABNORMAL HIGH (ref 70–99)

## 2022-01-03 NOTE — Progress Notes (Signed)
PCP - daniel paterson Cardiologist - denies  PPM/ICD - denies   Chest x-ray - denies EKG - 01/03/22 Stress Test - 09/25/12 ECHO - 09/25/12 Cardiac Cath - denies  Sleep Study - denies   Fasting Blood Sugar - 100-110 Checks Blood Sugar once a day  Last dose of GLP1 agonist-  ozempic- last dose on 12/31/21 GLP1 instructions: do not take on 01/10/22  Follow your surgeon's instructions on when to stop cilostazol (PLETAL).  If no instructions were given by your surgeon then you will need to call the office to get those instructions.      As of today, STOP taking any Aspirin (unless otherwise instructed by your surgeon) Aleve, Naproxen, Ibuprofen, Motrin, Advil, Goody's, BC's, all herbal medications, fish oil, and all vitamins. This includes celecoxib (CELEBREX) and diclofenac Sodium (VOLTAREN) gel.  ERAS Protcol -yes PRE-SURGERY Ensure or G2- not ordered  COVID TEST- not needed   Anesthesia review: no  Patient denies shortness of breath, fever, cough and chest pain at PAT appointment   All instructions explained to the patient, with a verbal understanding of the material. Patient agrees to go over the instructions while at home for a better understanding. Patient also instructed to self quarantine after being tested for COVID-19. The opportunity to ask questions was provided.

## 2022-01-03 NOTE — Progress Notes (Signed)
Notified dr. Zada Finders of mrsa swab results.

## 2022-01-04 LAB — HEMOGLOBIN A1C
Hgb A1c MFr Bld: 5.9 % — ABNORMAL HIGH (ref 4.8–5.6)
Mean Plasma Glucose: 123 mg/dL

## 2022-01-09 ENCOUNTER — Encounter: Payer: Self-pay | Admitting: Podiatry

## 2022-01-09 ENCOUNTER — Ambulatory Visit (INDEPENDENT_AMBULATORY_CARE_PROVIDER_SITE_OTHER): Payer: Medicare PPO | Admitting: Podiatry

## 2022-01-09 ENCOUNTER — Ambulatory Visit: Payer: Medicare PPO | Admitting: Podiatry

## 2022-01-09 DIAGNOSIS — E11610 Type 2 diabetes mellitus with diabetic neuropathic arthropathy: Secondary | ICD-10-CM

## 2022-01-09 DIAGNOSIS — M79674 Pain in right toe(s): Secondary | ICD-10-CM | POA: Diagnosis not present

## 2022-01-09 DIAGNOSIS — M79675 Pain in left toe(s): Secondary | ICD-10-CM

## 2022-01-09 DIAGNOSIS — B351 Tinea unguium: Secondary | ICD-10-CM

## 2022-01-09 DIAGNOSIS — E1121 Type 2 diabetes mellitus with diabetic nephropathy: Secondary | ICD-10-CM

## 2022-01-09 DIAGNOSIS — D689 Coagulation defect, unspecified: Secondary | ICD-10-CM

## 2022-01-09 NOTE — Progress Notes (Signed)
This patient returns to my office for at risk foot care.  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  This patient is unable to cut nails himself since the patient cannot reach his nails.These nails are painful walking and wearing shoes.  This patient presents for at risk foot care today.  General Appearance  Alert, conversant and in no acute stress.  Vascular  Dorsalis pedis and posterior tibial  pulses are palpable  bilaterally.  Capillary return is within normal limits  bilaterally. Temperature is within normal limits  bilaterally.  Neurologic  Senn-Weinstein monofilament wire test within normal limits  bilaterally. Muscle power within normal limits bilaterally.  Nails Thick disfigured discolored nails with subungual debris  from hallux to fifth toes bilaterally. No evidence of bacterial infection or drainage bilaterally.  Hallux nail left foot is completely unattached to nail bed right hallux.  Orthopedic  No limitations of motion  feet .  No crepitus or effusions noted.  No bony pathology or digital deformities noted.  Asymptomatic  HAV.  Limited ROM STJ right foot.  Skin  normotropic skin with no porokeratosis noted bilaterally.  No signs of infections or ulcers noted.     Onychomycosis  Pain in right toes  Pain in left toes  Consent was obtained for treatment procedures.   Mechanical debridement of nails 1-5  bilaterally performed with a nail nipper.  Filed with dremel without incident. Removal of left hallux toenail.   Return office visit    3 months                  Told patient to return for periodic foot care and evaluation due to potential at risk complications.   Gardiner Barefoot DPM

## 2022-01-16 NOTE — Anesthesia Preprocedure Evaluation (Signed)
Anesthesia Evaluation  Patient identified by MRN, date of birth, ID band Patient awake    Reviewed: Allergy & Precautions, NPO status , Patient's Chart, lab work & pertinent test results  Airway Mallampati: II  TM Distance: >3 FB Neck ROM: Full    Dental  (+) Teeth Intact, Dental Advisory Given, Caps,    Pulmonary former smoker   Pulmonary exam normal breath sounds clear to auscultation       Cardiovascular hypertension, Pt. on medications (-) angina + Peripheral Vascular Disease  (-) Past MI Normal cardiovascular exam Rhythm:Regular Rate:Normal     Neuro/Psych  Headaches PSYCHIATRIC DISORDERS Anxiety     Spondylolisthesis, Lumbar region  Sarcoidosis of central nervous system  Neuromuscular disease    GI/Hepatic negative GI ROS, Neg liver ROS,,,  Endo/Other  diabetes, Type 2, Oral Hypoglycemic Agents  Obesity   Renal/GU Renal InsufficiencyRenal disease   Prostate cancer     Musculoskeletal  (+) Arthritis ,    Abdominal   Peds  Hematology  (+) Blood dyscrasia, anemia   Anesthesia Other Findings   Reproductive/Obstetrics                             Anesthesia Physical Anesthesia Plan  ASA: 3  Anesthesia Plan: General   Post-op Pain Management: Tylenol PO (pre-op)*   Induction: Intravenous  PONV Risk Score and Plan: 2 and Dexamethasone and Ondansetron  Airway Management Planned: Oral ETT  Additional Equipment: Arterial line  Intra-op Plan:   Post-operative Plan: Extubation in OR  Informed Consent: I have reviewed the patients History and Physical, chart, labs and discussed the procedure including the risks, benefits and alternatives for the proposed anesthesia with the patient or authorized representative who has indicated his/her understanding and acceptance.     Dental advisory given  Plan Discussed with: CRNA  Anesthesia Plan Comments: (2nd PIV)         Anesthesia Quick Evaluation

## 2022-01-17 ENCOUNTER — Other Ambulatory Visit: Payer: Self-pay

## 2022-01-17 ENCOUNTER — Inpatient Hospital Stay (HOSPITAL_COMMUNITY): Payer: Medicare PPO | Admitting: Certified Registered"

## 2022-01-17 ENCOUNTER — Inpatient Hospital Stay (HOSPITAL_COMMUNITY): Payer: Medicare PPO

## 2022-01-17 ENCOUNTER — Encounter (HOSPITAL_COMMUNITY)
Admission: RE | Disposition: A | Discharge status: Home / Self Care | Payer: Self-pay | Source: Home / Self Care | Attending: Neurological Surgery

## 2022-01-17 ENCOUNTER — Encounter (HOSPITAL_COMMUNITY): Payer: Self-pay | Admitting: Neurological Surgery

## 2022-01-17 ENCOUNTER — Inpatient Hospital Stay (HOSPITAL_COMMUNITY)
Admission: RE | Admit: 2022-01-17 | Discharge: 2022-01-18 | DRG: 455 | Disposition: A | Discharge status: Home / Self Care | Payer: Medicare PPO | Attending: Neurological Surgery | Admitting: Neurological Surgery

## 2022-01-17 DIAGNOSIS — Z87891 Personal history of nicotine dependence: Secondary | ICD-10-CM | POA: Diagnosis not present

## 2022-01-17 DIAGNOSIS — M48061 Spinal stenosis, lumbar region without neurogenic claudication: Secondary | ICD-10-CM | POA: Diagnosis not present

## 2022-01-17 DIAGNOSIS — I10 Essential (primary) hypertension: Secondary | ICD-10-CM | POA: Diagnosis not present

## 2022-01-17 DIAGNOSIS — E669 Obesity, unspecified: Secondary | ICD-10-CM | POA: Diagnosis not present

## 2022-01-17 DIAGNOSIS — Z820 Family history of epilepsy and other diseases of the nervous system: Secondary | ICD-10-CM | POA: Diagnosis not present

## 2022-01-17 DIAGNOSIS — E1149 Type 2 diabetes mellitus with other diabetic neurological complication: Secondary | ICD-10-CM

## 2022-01-17 DIAGNOSIS — Z79899 Other long term (current) drug therapy: Secondary | ICD-10-CM | POA: Diagnosis not present

## 2022-01-17 DIAGNOSIS — Z8546 Personal history of malignant neoplasm of prostate: Secondary | ICD-10-CM

## 2022-01-17 DIAGNOSIS — E785 Hyperlipidemia, unspecified: Secondary | ICD-10-CM | POA: Diagnosis not present

## 2022-01-17 DIAGNOSIS — Z8249 Family history of ischemic heart disease and other diseases of the circulatory system: Secondary | ICD-10-CM

## 2022-01-17 DIAGNOSIS — G629 Polyneuropathy, unspecified: Secondary | ICD-10-CM | POA: Diagnosis present

## 2022-01-17 DIAGNOSIS — Z6832 Body mass index (BMI) 32.0-32.9, adult: Secondary | ICD-10-CM

## 2022-01-17 DIAGNOSIS — M5136 Other intervertebral disc degeneration, lumbar region: Secondary | ICD-10-CM | POA: Diagnosis not present

## 2022-01-17 DIAGNOSIS — F419 Anxiety disorder, unspecified: Secondary | ICD-10-CM | POA: Diagnosis not present

## 2022-01-17 DIAGNOSIS — Z823 Family history of stroke: Secondary | ICD-10-CM

## 2022-01-17 DIAGNOSIS — M4316 Spondylolisthesis, lumbar region: Principal | ICD-10-CM | POA: Diagnosis present

## 2022-01-17 DIAGNOSIS — Z85828 Personal history of other malignant neoplasm of skin: Secondary | ICD-10-CM | POA: Diagnosis not present

## 2022-01-17 DIAGNOSIS — Z7984 Long term (current) use of oral hypoglycemic drugs: Secondary | ICD-10-CM

## 2022-01-17 DIAGNOSIS — E119 Type 2 diabetes mellitus without complications: Secondary | ICD-10-CM | POA: Diagnosis present

## 2022-01-17 HISTORY — PX: TRANSFORAMINAL LUMBAR INTERBODY FUSION (TLIF) WITH PEDICLE SCREW FIXATION 2 LEVEL: SHX6142

## 2022-01-17 LAB — GLUCOSE, CAPILLARY
Glucose-Capillary: 122 mg/dL — ABNORMAL HIGH (ref 70–99)
Glucose-Capillary: 156 mg/dL — ABNORMAL HIGH (ref 70–99)
Glucose-Capillary: 239 mg/dL — ABNORMAL HIGH (ref 70–99)
Glucose-Capillary: 302 mg/dL — ABNORMAL HIGH (ref 70–99)

## 2022-01-17 SURGERY — TRANSFORAMINAL LUMBAR INTERBODY FUSION (TLIF) WITH PEDICLE SCREW FIXATION 2 LEVEL
Anesthesia: General | Site: Back

## 2022-01-17 MED ORDER — ONDANSETRON HCL 4 MG PO TABS: 4.0000 mg | ORAL_TABLET | Freq: Four times a day (QID) | ORAL | Status: DC | PRN

## 2022-01-17 MED ORDER — SODIUM CHLORIDE 0.9 % IV SOLN
250.0000 mL | INTRAVENOUS | Status: DC
  Administered 2022-01-17: 250 mL via INTRAVENOUS

## 2022-01-17 MED ORDER — SODIUM CHLORIDE 0.9% FLUSH: 3.0000 mL | INTRAVENOUS | Status: DC | PRN

## 2022-01-17 MED ORDER — THROMBIN 5000 UNITS EX SOLR
CUTANEOUS | Status: AC
  Filled 2022-01-17: qty 5000

## 2022-01-17 MED ORDER — PROPOFOL 10 MG/ML IV BOLUS
INTRAVENOUS | Status: DC | PRN
  Administered 2022-01-17: 160 mg via INTRAVENOUS

## 2022-01-17 MED ORDER — LIDOCAINE 2% (20 MG/ML) 5 ML SYRINGE
INTRAMUSCULAR | Status: DC | PRN
  Administered 2022-01-17: 80 mg via INTRAVENOUS

## 2022-01-17 MED ORDER — FLUOROMETHOLONE 0.1 % OP SUSP
1.0000 [drp] | Freq: Two times a day (BID) | OPHTHALMIC | Status: DC
  Administered 2022-01-17: 1 [drp] via OPHTHALMIC
  Filled 2022-01-17: qty 5

## 2022-01-17 MED ORDER — INSULIN ASPART 100 UNIT/ML IJ SOLN
0.0000 [IU] | Freq: Three times a day (TID) | INTRAMUSCULAR | Status: DC
  Administered 2022-01-17: 2 [IU] via SUBCUTANEOUS
  Administered 2022-01-18: 3 [IU] via SUBCUTANEOUS

## 2022-01-17 MED ORDER — SUGAMMADEX SODIUM 200 MG/2ML IV SOLN
INTRAVENOUS | Status: DC | PRN
  Administered 2022-01-17: 230 mg via INTRAVENOUS

## 2022-01-17 MED ORDER — CILOSTAZOL 100 MG PO TABS
100.0000 mg | ORAL_TABLET | Freq: Every day | ORAL | Status: DC
  Administered 2022-01-17 – 2022-01-18 (×2): 100 mg via ORAL
  Filled 2022-01-17 (×2): qty 1

## 2022-01-17 MED ORDER — FENTANYL CITRATE (PF) 250 MCG/5ML IJ SOLN
INTRAMUSCULAR | Status: AC
  Filled 2022-01-17: qty 5

## 2022-01-17 MED ORDER — PHENYLEPHRINE HCL-NACL 20-0.9 MG/250ML-% IV SOLN
INTRAVENOUS | Status: DC | PRN
  Administered 2022-01-17: 20 ug/min via INTRAVENOUS

## 2022-01-17 MED ORDER — HYPROMELLOSE 0.3 % OP SOLN: 1.0000 [drp] | Freq: Two times a day (BID) | OPHTHALMIC | Status: DC

## 2022-01-17 MED ORDER — HYDROMORPHONE HCL 1 MG/ML IJ SOLN
1.0000 mg | INTRAMUSCULAR | Status: DC | PRN
  Administered 2022-01-17: 1 mg via INTRAVENOUS
  Filled 2022-01-17: qty 1

## 2022-01-17 MED ORDER — 0.9 % SODIUM CHLORIDE (POUR BTL) OPTIME
TOPICAL | Status: DC | PRN
  Administered 2022-01-17: 1000 mL

## 2022-01-17 MED ORDER — INSULIN ASPART 100 UNIT/ML IJ SOLN: 0.0000 [IU] | INTRAMUSCULAR | Status: DC | PRN

## 2022-01-17 MED ORDER — ACETAMINOPHEN 325 MG PO TABS
650.0000 mg | ORAL_TABLET | ORAL | Status: DC | PRN
  Filled 2022-01-17: qty 2

## 2022-01-17 MED ORDER — SODIUM CHLORIDE 0.9% FLUSH: 3.0000 mL | Freq: Two times a day (BID) | INTRAVENOUS | Status: DC

## 2022-01-17 MED ORDER — OXYCODONE HCL 5 MG PO TABS
10.0000 mg | ORAL_TABLET | ORAL | Status: DC | PRN
  Administered 2022-01-17 – 2022-01-18 (×4): 10 mg via ORAL
  Filled 2022-01-17 (×4): qty 2

## 2022-01-17 MED ORDER — DOCUSATE SODIUM 100 MG PO CAPS
100.0000 mg | ORAL_CAPSULE | Freq: Two times a day (BID) | ORAL | Status: DC
  Administered 2022-01-17 – 2022-01-18 (×3): 100 mg via ORAL
  Filled 2022-01-17 (×3): qty 1

## 2022-01-17 MED ORDER — KETAMINE HCL 10 MG/ML IJ SOLN
INTRAMUSCULAR | Status: DC | PRN
  Administered 2022-01-17 (×2): 20 mg via INTRAVENOUS
  Administered 2022-01-17: 10 mg via INTRAVENOUS

## 2022-01-17 MED ORDER — ONDANSETRON HCL 4 MG/2ML IJ SOLN
INTRAMUSCULAR | Status: DC | PRN
  Administered 2022-01-17: 4 mg via INTRAVENOUS

## 2022-01-17 MED ORDER — CYCLOBENZAPRINE HCL 10 MG PO TABS
10.0000 mg | ORAL_TABLET | Freq: Three times a day (TID) | ORAL | Status: DC | PRN
  Administered 2022-01-17 – 2022-01-18 (×4): 10 mg via ORAL
  Filled 2022-01-17 (×4): qty 1

## 2022-01-17 MED ORDER — CHLORHEXIDINE GLUCONATE 0.12 % MT SOLN
15.0000 mL | Freq: Once | OROMUCOSAL | Status: AC
  Administered 2022-01-17: 15 mL via OROMUCOSAL
  Filled 2022-01-17: qty 15

## 2022-01-17 MED ORDER — PROPOFOL 10 MG/ML IV BOLUS
INTRAVENOUS | Status: AC
  Filled 2022-01-17: qty 20

## 2022-01-17 MED ORDER — ACETAMINOPHEN 500 MG PO TABS
1000.0000 mg | ORAL_TABLET | Freq: Once | ORAL | Status: DC
  Filled 2022-01-17: qty 2

## 2022-01-17 MED ORDER — CHLORHEXIDINE GLUCONATE CLOTH 2 % EX PADS: 6.0000 | MEDICATED_PAD | Freq: Once | CUTANEOUS | Status: DC

## 2022-01-17 MED ORDER — CEFAZOLIN SODIUM-DEXTROSE 2-4 GM/100ML-% IV SOLN
2.0000 g | Freq: Three times a day (TID) | INTRAVENOUS | Status: AC
  Administered 2022-01-17 (×2): 2 g via INTRAVENOUS
  Filled 2022-01-17 (×2): qty 100

## 2022-01-17 MED ORDER — GABAPENTIN 300 MG PO CAPS
300.0000 mg | ORAL_CAPSULE | Freq: Two times a day (BID) | ORAL | Status: DC
  Administered 2022-01-17 – 2022-01-18 (×2): 300 mg via ORAL
  Filled 2022-01-17 (×3): qty 1

## 2022-01-17 MED ORDER — ACETAMINOPHEN 650 MG RE SUPP: 650.0000 mg | RECTAL | Status: DC | PRN

## 2022-01-17 MED ORDER — PRAVASTATIN SODIUM 10 MG PO TABS
20.0000 mg | ORAL_TABLET | Freq: Every day | ORAL | Status: DC
  Administered 2022-01-17 – 2022-01-18 (×2): 20 mg via ORAL
  Filled 2022-01-17 (×2): qty 2

## 2022-01-17 MED ORDER — DEXAMETHASONE SODIUM PHOSPHATE 10 MG/ML IJ SOLN
INTRAMUSCULAR | Status: DC | PRN
  Administered 2022-01-17: 10 mg via INTRAVENOUS

## 2022-01-17 MED ORDER — LIDOCAINE-EPINEPHRINE 1 %-1:100000 IJ SOLN
INTRAMUSCULAR | Status: AC
  Filled 2022-01-17: qty 1

## 2022-01-17 MED ORDER — MENTHOL 3 MG MT LOZG: 1.0000 | LOZENGE | OROMUCOSAL | Status: DC | PRN

## 2022-01-17 MED ORDER — ZOLPIDEM TARTRATE 5 MG PO TABS: 10.0000 mg | ORAL_TABLET | Freq: Every evening | ORAL | Status: DC | PRN

## 2022-01-17 MED ORDER — FENTANYL CITRATE (PF) 250 MCG/5ML IJ SOLN
INTRAMUSCULAR | Status: DC | PRN
  Administered 2022-01-17: 100 ug via INTRAVENOUS
  Administered 2022-01-17: 50 ug via INTRAVENOUS

## 2022-01-17 MED ORDER — FENTANYL CITRATE (PF) 100 MCG/2ML IJ SOLN
25.0000 ug | INTRAMUSCULAR | Status: DC | PRN
  Administered 2022-01-17 (×3): 50 ug via INTRAVENOUS

## 2022-01-17 MED ORDER — LACTATED RINGERS IV SOLN: INTRAVENOUS | Status: DC

## 2022-01-17 MED ORDER — THROMBIN 5000 UNITS EX SOLR: OROMUCOSAL | Status: DC | PRN

## 2022-01-17 MED ORDER — AMLODIPINE BESYLATE 5 MG PO TABS
2.5000 mg | ORAL_TABLET | Freq: Every day | ORAL | Status: DC
  Administered 2022-01-18: 2.5 mg via ORAL
  Filled 2022-01-17: qty 1

## 2022-01-17 MED ORDER — ORAL CARE MOUTH RINSE: 15.0000 mL | Freq: Once | OROMUCOSAL | Status: AC

## 2022-01-17 MED ORDER — CEFAZOLIN SODIUM-DEXTROSE 2-4 GM/100ML-% IV SOLN
2.0000 g | INTRAVENOUS | Status: AC
  Administered 2022-01-17: 2 g via INTRAVENOUS
  Filled 2022-01-17: qty 100

## 2022-01-17 MED ORDER — ONDANSETRON HCL 4 MG/2ML IJ SOLN: 4.0000 mg | Freq: Four times a day (QID) | INTRAMUSCULAR | Status: DC | PRN

## 2022-01-17 MED ORDER — ALLOPURINOL 300 MG PO TABS
300.0000 mg | ORAL_TABLET | Freq: Every day | ORAL | Status: DC
  Administered 2022-01-17 – 2022-01-18 (×2): 300 mg via ORAL
  Filled 2022-01-17 (×2): qty 1

## 2022-01-17 MED ORDER — LIDOCAINE-EPINEPHRINE 1 %-1:100000 IJ SOLN
INTRAMUSCULAR | Status: DC | PRN
  Administered 2022-01-17: 10 mL via SURGICAL_CAVITY

## 2022-01-17 MED ORDER — PHENOL 1.4 % MT LIQD: 1.0000 | OROMUCOSAL | Status: DC | PRN

## 2022-01-17 MED ORDER — OXYCODONE HCL 5 MG PO TABS: 5.0000 mg | ORAL_TABLET | ORAL | Status: DC | PRN

## 2022-01-17 MED ORDER — ROCURONIUM BROMIDE 10 MG/ML (PF) SYRINGE
PREFILLED_SYRINGE | INTRAVENOUS | Status: DC | PRN
  Administered 2022-01-17: 20 mg via INTRAVENOUS
  Administered 2022-01-17: 30 mg via INTRAVENOUS
  Administered 2022-01-17: 70 mg via INTRAVENOUS
  Administered 2022-01-17: 30 mg via INTRAVENOUS

## 2022-01-17 MED ORDER — POLYETHYLENE GLYCOL 3350 17 G PO PACK: 17.0000 g | PACK | Freq: Every day | ORAL | Status: DC | PRN

## 2022-01-17 MED ORDER — FENTANYL CITRATE (PF) 100 MCG/2ML IJ SOLN
INTRAMUSCULAR | Status: AC
  Filled 2022-01-17: qty 2

## 2022-01-17 MED ORDER — ONDANSETRON HCL 4 MG/2ML IJ SOLN: 4.0000 mg | Freq: Once | INTRAMUSCULAR | Status: DC | PRN

## 2022-01-17 MED ORDER — INSULIN ASPART 100 UNIT/ML IJ SOLN
0.0000 [IU] | Freq: Every day | INTRAMUSCULAR | Status: DC
  Administered 2022-01-17: 4 [IU] via SUBCUTANEOUS

## 2022-01-17 MED ORDER — CELECOXIB 200 MG PO CAPS
200.0000 mg | ORAL_CAPSULE | Freq: Two times a day (BID) | ORAL | Status: DC
  Administered 2022-01-17 – 2022-01-18 (×3): 200 mg via ORAL
  Filled 2022-01-17 (×3): qty 1

## 2022-01-17 MED ORDER — BUPIVACAINE HCL (PF) 0.5 % IJ SOLN
INTRAMUSCULAR | Status: AC
  Filled 2022-01-17: qty 30

## 2022-01-17 MED ORDER — KETAMINE HCL 50 MG/5ML IJ SOSY
PREFILLED_SYRINGE | INTRAMUSCULAR | Status: AC
  Filled 2022-01-17: qty 5

## 2022-01-17 MED ORDER — PHENYLEPHRINE 80 MCG/ML (10ML) SYRINGE FOR IV PUSH (FOR BLOOD PRESSURE SUPPORT)
PREFILLED_SYRINGE | INTRAVENOUS | Status: DC | PRN
  Administered 2022-01-17 (×2): 80 ug via INTRAVENOUS

## 2022-01-17 SURGICAL SUPPLY — 75 items
BAG COUNTER SPONGE SURGICOUNT (BAG) ×2 IMPLANT
BAND RUBBER #18 3X1/16 STRL (MISCELLANEOUS) ×4 IMPLANT
BASKET BONE COLLECTION (BASKET) ×2 IMPLANT
BENZOIN TINCTURE PRP APPL 2/3 (GAUZE/BANDAGES/DRESSINGS) IMPLANT
BLADE BONE MILL MEDIUM (MISCELLANEOUS) IMPLANT
BLADE CLIPPER SURG (BLADE) IMPLANT
BLADE SURG 11 STRL SS (BLADE) ×2 IMPLANT
BUR 14 MATCH 3 (BUR) IMPLANT
BUR MATCHSTICK NEURO 3.0 LAGG (BURR) ×2 IMPLANT
BUR MR8 14CM BALL SYMTRI 5 (BUR) IMPLANT
BUR PRECISION FLUTE 5.0 (BURR) ×2 IMPLANT
BURR 14 MATCH 3 (BUR) ×2
BURR MR8 14CM BALL SYMTRI 5 (BUR)
CAGE EXP CATALYFT 9 (Plate) IMPLANT
CAGE INTERBODY PL SHT 7X22.5 (Plate) IMPLANT
CANISTER SUCT 3000ML PPV (MISCELLANEOUS) ×2 IMPLANT
CNTNR URN SCR LID CUP LEK RST (MISCELLANEOUS) ×2 IMPLANT
CONT SPEC 4OZ STRL OR WHT (MISCELLANEOUS) ×2
COVER BACK TABLE 60X90IN (DRAPES) ×2 IMPLANT
COVERAGE SUPPORT O-ARM STEALTH (MISCELLANEOUS) ×2 IMPLANT
DERMABOND ADVANCED .7 DNX12 (GAUZE/BANDAGES/DRESSINGS) ×2 IMPLANT
DRAPE C-ARM 42X72 X-RAY (DRAPES) IMPLANT
DRAPE C-ARMOR (DRAPES) IMPLANT
DRAPE LAPAROTOMY 100X72X124 (DRAPES) ×2 IMPLANT
DRAPE MICROSCOPE SLANT 54X150 (MISCELLANEOUS) ×2 IMPLANT
DRAPE SHEET LG 3/4 BI-LAMINATE (DRAPES) ×8 IMPLANT
DRAPE SURG 17X23 STRL (DRAPES) ×2 IMPLANT
DURAPREP 26ML APPLICATOR (WOUND CARE) ×2 IMPLANT
ELECT REM PT RETURN 9FT ADLT (ELECTROSURGICAL) ×2
ELECTRODE REM PT RTRN 9FT ADLT (ELECTROSURGICAL) ×2 IMPLANT
FEE COVERAGE SUPPORT O-ARM (MISCELLANEOUS) ×2 IMPLANT
GAUZE 4X4 16PLY ~~LOC~~+RFID DBL (SPONGE) IMPLANT
GAUZE SPONGE 4X4 12PLY STRL (GAUZE/BANDAGES/DRESSINGS) IMPLANT
GLOVE BIOGEL PI IND STRL 7.5 (GLOVE) ×4 IMPLANT
GLOVE ECLIPSE 7.5 STRL STRAW (GLOVE) ×4 IMPLANT
GLOVE EXAM NITRILE LRG STRL (GLOVE) IMPLANT
GLOVE EXAM NITRILE XL STR (GLOVE) IMPLANT
GLOVE EXAM NITRILE XS STR PU (GLOVE) IMPLANT
GOWN STRL REUS W/ TWL LRG LVL3 (GOWN DISPOSABLE) ×8 IMPLANT
GOWN STRL REUS W/ TWL XL LVL3 (GOWN DISPOSABLE) IMPLANT
GOWN STRL REUS W/TWL 2XL LVL3 (GOWN DISPOSABLE) IMPLANT
GOWN STRL REUS W/TWL LRG LVL3 (GOWN DISPOSABLE) ×8
GOWN STRL REUS W/TWL XL LVL3 (GOWN DISPOSABLE)
HEMOSTAT POWDER KIT SURGIFOAM (HEMOSTASIS) ×2 IMPLANT
KIT BASIN OR (CUSTOM PROCEDURE TRAY) ×2 IMPLANT
KIT INFUSE SMALL (Orthopedic Implant) IMPLANT
KIT POSITION SURG JACKSON T1 (MISCELLANEOUS) ×2 IMPLANT
KIT TURNOVER KIT B (KITS) ×2 IMPLANT
MARKER SPHERE PSV REFLC NDI (MISCELLANEOUS) ×10 IMPLANT
MILL BONE PREP (MISCELLANEOUS) IMPLANT
NDL HYPO 18GX1.5 BLUNT FILL (NEEDLE) IMPLANT
NDL SPNL 18GX3.5 QUINCKE PK (NEEDLE) IMPLANT
NEEDLE HYPO 18GX1.5 BLUNT FILL (NEEDLE) IMPLANT
NEEDLE HYPO 22GX1.5 SAFETY (NEEDLE) ×2 IMPLANT
NEEDLE SPNL 18GX3.5 QUINCKE PK (NEEDLE) IMPLANT
NS IRRIG 1000ML POUR BTL (IV SOLUTION) ×2 IMPLANT
PACK LAMINECTOMY NEURO (CUSTOM PROCEDURE TRAY) ×2 IMPLANT
PAD ARMBOARD 7.5X6 YLW CONV (MISCELLANEOUS) ×6 IMPLANT
ROD PED SOLERA 5.5X60 (Rod) IMPLANT
SCREW MAS/SET HEAD FOR 5.5 ROD (Screw) IMPLANT
SCREW MAS/SET STERILE 4PK (Screw) IMPLANT
SCREW SHANK NL TITAN 6.5X45 (Screw) IMPLANT
SPIKE FLUID TRANSFER (MISCELLANEOUS) ×2 IMPLANT
SPONGE SURGIFOAM ABS GEL 100 (HEMOSTASIS) IMPLANT
SPONGE T-LAP 4X18 ~~LOC~~+RFID (SPONGE) IMPLANT
STRIP CLOSURE SKIN 1/2X4 (GAUZE/BANDAGES/DRESSINGS) IMPLANT
SUT MNCRL AB 3-0 PS2 18 (SUTURE) ×2 IMPLANT
SUT VIC AB 0 CT1 18XCR BRD8 (SUTURE) ×2 IMPLANT
SUT VIC AB 0 CT1 8-18 (SUTURE) ×2
SUT VIC AB 2-0 CP2 18 (SUTURE) ×2 IMPLANT
SYR 3ML LL SCALE MARK (SYRINGE) IMPLANT
TOWEL GREEN STERILE (TOWEL DISPOSABLE) ×2 IMPLANT
TOWEL GREEN STERILE FF (TOWEL DISPOSABLE) ×2 IMPLANT
TRAY FOLEY MTR SLVR 16FR STAT (SET/KITS/TRAYS/PACK) ×2 IMPLANT
WATER STERILE IRR 1000ML POUR (IV SOLUTION) ×2 IMPLANT

## 2022-01-17 NOTE — H&P (Signed)
Surgical H&P Update  HPI: 72 y.o. with a history of low back pain. Workup showed multi-level spondylosis with spondylolisthesis worst at L2-3 and L3-4. No changes in health since they were last seen. Still having the above and wishes to proceed with surgery.  PMHx:  Past Medical History:  Diagnosis Date   Anxiety    Arthritis    Diabetes mellitus    Diverticulosis    Headache    Hyperlipidemia    Hypertension    Neuropathy    feet and fingers   Prostate cancer ([place])    Sarcoidosis of central nervous system    negatively affects gait   Skin cancer    Multiple areas removed. Managed by Dr. Lois Huxley.   Umbilical hernia    FamHx:  Family History  Problem Relation Age of Onset   Heart disease Father        Pacemaker   CVA Father    Alzheimer's disease Father    Colon cancer Neg Hx    Breast cancer Neg Hx    Pancreatic cancer Neg Hx    Prostate cancer Neg Hx    SocHx:  reports that he quit smoking about 41 years ago. His smoking use included cigarettes. He has a 50.00 pack-year smoking history. He has never used smokeless tobacco. He reports current alcohol use of about 3.0 - 5.0 standard drinks of alcohol per week. He reports that he does not use drugs.  Physical Exam: Strength 5/5 x4 and SILTx4 except +b/l stocking-glove numbness  Assesment/Plan: 72 y.o. man with 2 level spondylolisthesis, here for open 2 level decompression and TLIF/PLF. Risks, benefits, and alternatives discussed and the patient would like to continue with surgery.  -OR today -3C post-op  Judith Part, MD 01/17/22 7:17 AM

## 2022-01-17 NOTE — Anesthesia Procedure Notes (Signed)
Procedure Name: Intubation Date/Time: 01/17/2022 7:35 AM  Performed by: Amadeo Garnet, CRNAPre-anesthesia Checklist: Patient identified, Emergency Drugs available, Suction available and Patient being monitored Patient Re-evaluated:Patient Re-evaluated prior to induction Oxygen Delivery Method: Circle system utilized Preoxygenation: Pre-oxygenation with 100% oxygen Induction Type: IV induction Ventilation: Mask ventilation without difficulty Laryngoscope Size: Mac and 4 Grade View: Grade II Tube type: Oral Number of attempts: 1 Airway Equipment and Method: Stylet and Oral airway Placement Confirmation: ETT inserted through vocal cords under direct vision, positive ETCO2 and breath sounds checked- equal and bilateral Secured at: 23 cm Tube secured with: Tape Dental Injury: Teeth and Oropharynx as per pre-operative assessment

## 2022-01-17 NOTE — Op Note (Signed)
PATIENT: Raymond Herring  DAY OF SURGERY: 01/17/22   PRE-OPERATIVE DIAGNOSIS:  L2-3, L3-4 lumbar spondylolisthesis and stenosis   POST-OPERATIVE DIAGNOSIS:  Same   PROCEDURE:  L2-3, L3-4 open laminectomy, transforaminal lumbar interbody fusion, posterolateral instrumented fusion   SURGEON:  Surgeon(s) and Role:    Kellin Fifer, Joyice Faster, MD     Ashok Pall MD - Assistant    Norm Parcel PA - Assistant   ANESTHESIA: ETGA   BRIEF HISTORY: This is a 72 year old man who presented with progressive low back pain. Workup showed interval progression of spondylolisthesis at L2-3 and L3-4. This was discussed with the patient as well as risks, benefits, and alternatives and wished to proceed with surgery.   OPERATIVE DETAIL: The patient was taken to the operating room and anesthesia was induced by the anesthesia team. They were placed on the OR table in the prone position with padding of all pressure points. A formal time out was performed with two patient identifiers and confirmed the operative site. The operative site was marked, hair was clipped with surgical clippers, the area was then prepped and draped in a sterile fashion. Fluoro was used to localize the operative level and a midline incision was placed to expose from L2 to L4. Subperiosteal dissection was performed bilaterally to expose the posterior elements.   A spinous process clamp was applied and secured, followed by attachment of a reference array. The field was draped and the O-arm was brought into the field. An intra-op CT was performed, sent to the Stealth navigation station, registered to the patient's anatomy, and confirmed with landmarks with acceptable fit. Stereotactic spinal navigation was utilized throughout the procedure for planning and placement of pedicle screw trajectories.  Instrumentation was then performed. Frameless stereotaxy was used to guide placement of bilateral pedicle screws ([employer]) at L2, L3, and L4.  These were placed with navigated instruments by localizing the pedicle, drilling a pilot hole, cannulating the pedicle with an awl-tap, palpating for pedicle wall breaches, and then placing the screw.   TLIF was then performed on the left at L2-3 and L3-4. At each level, a complete facetectomy was performed, the exiting and traversing roots were identified and protected, a discectomy was performed, endplates were prepped, the disc space was packed with bone graft, and an expandable titanium interbody was placed, backfilled with bone graft, and released, using navigated instruments.  The reference array was removed and decompressive laminectomies were performed at L2-3 and L3-4 with a combination of high speed drill and rongeurs.   The pedicle screw caps were placed and connected with rods bilaterally and final tightened according to manufacturer torque specifications. The bone was thoroughly decorticated over the fusion surface and the previously resected bone fragments were morselized and used as autograft with the addition of rBMP.   All instrument and sponge counts were correct, the incision was then closed in layers. The patient was then returned to anesthesia for emergence. No apparent complications at the completion of the procedure.   EBL:  374m   DRAINS: none   SPECIMENS: none   TJudith Part MD 01/17/22 7:20 AM

## 2022-01-17 NOTE — Transfer of Care (Signed)
Immediate Anesthesia Transfer of Care Note  Patient: Raymond Herring  Procedure(s) Performed: L2-3 L3-4 Open Laminectomy/TLIF/posterolateral fusion (Back) Application of O-Arm  Patient Location: PACU  Anesthesia Type:General  Level of Consciousness: awake, alert , and oriented  Airway & Oxygen Therapy: Patient Spontanous Breathing and Patient connected to face mask oxygen  Post-op Assessment: Report given to RN, Post -op Vital signs reviewed and stable, and Patient moving all extremities  Post vital signs: Reviewed and stable  Last Vitals:  Vitals Value Taken Time  BP 125/82 01/17/22 1139  Temp    Pulse 73 01/17/22 1140  Resp 18 01/17/22 1140  SpO2 99 % 01/17/22 1140  Vitals shown include unvalidated device data.  Last Pain:  Vitals:   01/17/22 0617  TempSrc:   PainSc: 2       Patients Stated Pain Goal: 2 (33/43/56 8616)  Complications: No notable events documented.

## 2022-01-18 ENCOUNTER — Encounter (HOSPITAL_COMMUNITY): Payer: Self-pay | Admitting: Neurological Surgery

## 2022-01-18 LAB — GLUCOSE, CAPILLARY: Glucose-Capillary: 162 mg/dL — ABNORMAL HIGH (ref 70–99)

## 2022-01-18 MED ORDER — OXYCODONE HCL 5 MG PO TABS: 5.0000 mg | ORAL_TABLET | ORAL | 0 refills | Status: DC | PRN

## 2022-01-18 NOTE — Evaluation (Signed)
Occupational Therapy Evaluation Patient Details Name: Raymond Herring MRN: [id number] DOB: 03/24/1950 Today's Date: 01/18/2022   History of Present Illness Pt is a 72 y.o. male s/p L2-4 open laminectomy, transforaminal interbody fision, posterolateral instrumented fusion. PMH significant for diverticulosis, HLD, HTN, neuropathy, prostate cancer, sarcoidosis of central nervous system.   Clinical Impression   PTA, pt performing ADL and IADL with mod I for increased time and use of compensatory techniques. Upon eval, pt performing UB ADL with set-up and LB ADL with up to min guard A and AE to optimize adherence to spinal precautions. Pt educated and demonstrating use of compensatory techniques for bed mobility, transfers, shower transfer, LB dressing, LB bathing, and toileting within precautions. Recommending discharge home with no OT follow up at this time. OT to sign off. Thank you for this order.      Recommendations for follow up therapy are one component of a multi-disciplinary discharge planning process, led by the attending physician.  Recommendations may be updated based on patient status, additional functional criteria and insurance authorization.   Follow Up Recommendations  No OT follow up     Assistance Recommended at Discharge Intermittent Supervision/Assistance  Patient can return home with the following A little help with bathing/dressing/bathroom;A little help with walking and/or transfers;Assistance with cooking/housework;Help with stairs or ramp for entrance;Assist for transportation    Functional Status Assessment  Patient has had a recent decline in their functional status and demonstrates the ability to make significant improvements in function in a reasonable and predictable amount of time.  Equipment Recommendations  None recommended by OT    Recommendations for Other Services       Precautions / Restrictions Precautions Precautions: Back Precaution Booklet  Issued: Yes (comment) Precaution Comments: All precautions reviewed within the context of ADL Required Braces or Orthoses:  (no brace needed orders) Restrictions Weight Bearing Restrictions: No      Mobility Bed Mobility Overal bed mobility: Needs Assistance Bed Mobility: Rolling, Sidelying to Sit, Sit to Sidelying Rolling: Supervision Sidelying to sit: Min guard     Sit to sidelying: Min assist General bed mobility comments: Min A for guidance into bed and technique    Transfers Overall transfer level: Needs assistance Equipment used: Rolling walker (2 wheels) Transfers: Sit to/from Stand Sit to Stand: Supervision           General transfer comment: Initially min guard A progressing to supervision for safety      Balance Overall balance assessment: Mild deficits observed, not formally tested                                         ADL either performed or assessed with clinical judgement   ADL Overall ADL's : Needs assistance/impaired Eating/Feeding: Modified independent;Sitting   Grooming: Supervision/safety;Standing   Upper Body Bathing: Set up;Sitting   Lower Body Bathing: Sit to/from stand;Supervison/ safety   Upper Body Dressing : Set up;Sitting   Lower Body Dressing: Sit to/from stand;With adaptive equipment;Supervision/safety Lower Body Dressing Details (indicate cue type and reason): Pt educated regarding AE due to unable to reach distal foot without bending forward Toilet Transfer: Ambulation;Rolling walker (2 wheels);Comfort height toilet;Min guard     Toileting - Clothing Manipulation Details (indicate cue type and reason): reviewing compensatory techniques Tub/ Shower Transfer: Walk-in shower;Supervision/safety;Adhering to back precautions;Rolling walker (2 wheels);Ambulation;Shower seat   Functional mobility during ADLs: Supervision/safety;Min guard;Rolling walker (2  wheels) General ADL Comments: Pt observed to kick RW  intermittently due to bil LE neuropathy. Encouraged pt to wear shoes and compensatory techniques to protect feet. PT notified.     Vision Baseline Vision/History: 1 Wears glasses Ability to See in Adequate Light: 0 Adequate Patient Visual Report: No change from baseline Vision Assessment?: No apparent visual deficits Additional Comments: donning glasses to read handout     Perception     Praxis      Pertinent Vitals/Pain Pain Assessment Pain Assessment: Faces Faces Pain Scale: Hurts a little bit Pain Location: back Pain Descriptors / Indicators: Aching, Discomfort, Guarding Pain Intervention(s): Limited activity within patient's tolerance, Monitored during session     Hand Dominance     Extremity/Trunk Assessment Upper Extremity Assessment Upper Extremity Assessment: Overall WFL for tasks assessed   Lower Extremity Assessment Lower Extremity Assessment: Defer to PT evaluation   Cervical / Trunk Assessment Cervical / Trunk Assessment: Back Surgery   Communication Communication Communication: No difficulties   Cognition Arousal/Alertness: Awake/alert Behavior During Therapy: WFL for tasks assessed/performed Overall Cognitive Status: Within Functional Limits for tasks assessed                                       General Comments  VSS    Exercises     Shoulder Instructions      Home Living Family/patient expects to be discharged to:: Private residence Living Arrangements: Spouse/significant other Available Help at Discharge: Family Type of Home: House Home Access: Stairs to enter [employer] of Steps: 2 steps small threshold Entrance Stairs-Rails: Can reach both;Left;Right Home Layout: Two level;Bed/bath upstairs Alternate Level Stairs-Number of Steps: flight Alternate Level Stairs-Rails: Left Bathroom Shower/Tub: Occupational psychologist: Handicapped height Bathroom Accessibility: Yes How Accessible: Accessible via  walker Home Equipment: Cane - single point;Shower seat - built in;Grab bars - toilet          Prior Functioning/Environment Prior Level of Function : Independent/Modified Independent;Driving             Mobility Comments: Pt reports that he keeps a cane with him ADLs Comments: Mod I for compensatory techniques and increased time        OT Problem List: Decreased strength;Decreased activity tolerance;Impaired balance (sitting and/or standing);Decreased knowledge of precautions;Decreased knowledge of use of DME or AE;Impaired sensation;Pain      OT Treatment/Interventions:      OT Goals(Current goals can be found in the care plan section) Acute Rehab OT Goals Patient Stated Goal: get back to hunting OT Goal Formulation: With patient  OT Frequency:      Co-evaluation              AM-PAC OT "6 Clicks" Daily Activity     Outcome Measure Help from another person eating meals?: None Help from another person taking care of personal grooming?: A Little Help from another person toileting, which includes using toliet, bedpan, or urinal?: A Little Help from another person bathing (including washing, rinsing, drying)?: A Little Help from another person to put on and taking off regular upper body clothing?: A Little Help from another person to put on and taking off regular lower body clothing?: A Little 6 Click Score: 19   End of Session Equipment Utilized During Treatment: Gait belt;Rolling walker (2 wheels) Nurse Communication: Mobility status  Activity Tolerance: Patient tolerated treatment well Patient left: in bed;with call bell/phone within  reach  OT Visit Diagnosis: Unsteadiness on feet (R26.81);Muscle weakness (generalized) (M62.81);Other abnormalities of gait and mobility (R26.89);Pain Pain - part of body:  (back)                Time: 0712-5247 OT Time Calculation (min): 33 min Charges:  OT General Charges $OT Visit: 1 Visit OT Evaluation $OT Eval Low  Complexity: 1 Low OT Treatments $Self Care/Home Management : 8-22 mins  Elder Cyphers, OTR/L [facility] Acute Rehabilitation Office: [phone]   Magnus Ivan 01/18/2022, 9:25 AM

## 2022-01-18 NOTE — Anesthesia Postprocedure Evaluation (Signed)
Anesthesia Post Note  Patient: Raymond Herring  Procedure(s) Performed: L2-3 L3-4 Open Laminectomy/TLIF/posterolateral fusion (Back) Application of O-Arm     Patient location during evaluation: PACU Anesthesia Type: General Level of consciousness: awake and alert Pain management: pain level controlled Vital Signs Assessment: post-procedure vital signs reviewed and stable Respiratory status: spontaneous breathing, nonlabored ventilation, respiratory function stable and patient connected to nasal cannula oxygen Cardiovascular status: blood pressure returned to baseline and stable Postop Assessment: no apparent nausea or vomiting Anesthetic complications: no   No notable events documented.  Last Vitals:  Vitals:   01/17/22 2303 01/18/22 0334  BP: 102/70 118/68  Pulse: 76 73  Resp: 18 18  Temp: 36.9 C 36.8 C  SpO2: 94% 99%    Last Pain:  Vitals:   01/18/22 0341  TempSrc:   PainSc: [place]

## 2022-01-18 NOTE — Evaluation (Signed)
Physical Therapy Evaluation  Patient Details Name: Raymond Herring MRN: [id number] DOB: [birth year] Today's Date: 01/18/2022  History of Present Illness  Pt is a 72 y.o. male s/p L2-4 open laminectomy, transforaminal interbody fision, posterolateral instrumented fusion. PMH significant for diverticulosis, HLD, HTN, neuropathy, prostate cancer, sarcoidosis of central nervous system.   Clinical Impression  Pt admitted with above diagnosis. At the time of PT eval, pt was able to demonstrate transfers and ambulation with gross supervision for safety and RW for support. Pt was educated on precautions, positioning recommendations, appropriate activity progression, and car transfer. Pt currently with functional limitations due to the deficits listed below (see PT Problem List). Pt will benefit from skilled PT to increase their independence and safety with mobility to allow discharge to the venue listed below.         Recommendations for follow up therapy are one component of a multi-disciplinary discharge planning process, led by the attending physician.  Recommendations may be updated based on patient status, additional functional criteria and insurance authorization.  Follow Up Recommendations No PT follow up      Assistance Recommended at Discharge PRN  Patient can return home with the following  A little help with walking and/or transfers;A little help with bathing/dressing/bathroom;Assistance with cooking/housework;Assist for transportation;Help with stairs or ramp for entrance    Equipment Recommendations None recommended by PT  Recommendations for Other Services       Functional Status Assessment Patient has had a recent decline in their functional status and demonstrates the ability to make significant improvements in function in a reasonable and predictable amount of time.     Precautions / Restrictions Precautions Precautions: Back Precaution Booklet Issued: Yes  (comment) Precaution Comments: Reviewed handout and pt was cued for precautions during functional mobility. Required Braces or Orthoses:  (no brace needed orders) Restrictions Weight Bearing Restrictions: No      Mobility  Bed Mobility Overal bed mobility: Needs Assistance Bed Mobility: Rolling, Sidelying to Sit, Sit to Sidelying Rolling: Supervision Sidelying to sit: Supervision     Sit to sidelying: Supervision General bed mobility comments: HOB flat and rails lowered to simulate home environment. No assist required but VC's throughout for optimal log roll technique.    Transfers Overall transfer level: Needs assistance Equipment used: Rolling walker (2 wheels) Transfers: Sit to/from Stand Sit to Stand: Supervision           General transfer comment: Pt demonstrated proper hand placement on seated surface for safety. No assist required but supervision provided for safety.    Ambulation/Gait Ambulation/Gait assistance: Supervision Gait Distance (Feet): 300 Feet Assistive device: Rolling walker (2 wheels) Gait Pattern/deviations: Step-through pattern, Decreased stride length, Narrow base of support, Trunk flexed Gait velocity: Decreased     General Gait Details: VC's throughout for improved posture, closer walker proximity, and forward gaze. No assist required and no overt LOB noted.  Stairs Stairs: Yes Stairs assistance: Min guard Stair Management: One rail Left, Step to pattern, Forwards Number of Stairs: 10 General stair comments: VC's throughout for sequencing and general safety.  Wheelchair Mobility    Modified Rankin (Stroke Patients Only)       Balance Overall balance assessment: Mild deficits observed, not formally tested                                           Pertinent Vitals/Pain Pain Assessment Pain  Assessment: Faces Faces Pain Scale: Hurts a little bit Pain Location: back Pain Descriptors / Indicators: Aching,  Discomfort, Guarding Pain Intervention(s): Limited activity within patient's tolerance, Monitored during session, Repositioned    Home Living Family/patient expects to be discharged to:: Private residence Living Arrangements: Spouse/significant other Available Help at Discharge: Family Type of Home: House Home Access: Stairs to enter Entrance Stairs-Rails: Can reach both;Left;Right Entrance Stairs-Number of Steps: 2 steps small threshold Alternate Level Stairs-Number of Steps: flight Home Layout: Two level;Bed/bath upstairs Home Equipment: Cane - single point;Shower seat - built in;Grab bars - toilet      Prior Function Prior Level of Function : Independent/Modified Independent;Driving             Mobility Comments: Pt reports that he keeps a cane with him ADLs Comments: Mod I for compensatory techniques and increased time     Hand Dominance   Dominant Hand: Left    Extremity/Trunk Assessment   Upper Extremity Assessment Upper Extremity Assessment: Defer to OT evaluation    Lower Extremity Assessment Lower Extremity Assessment: Generalized weakness    Cervical / Trunk Assessment Cervical / Trunk Assessment: Back Surgery  Communication   Communication: No difficulties  Cognition Arousal/Alertness: Awake/alert Behavior During Therapy: WFL for tasks assessed/performed Overall Cognitive Status: Within Functional Limits for tasks assessed                                          General Comments General comments (skin integrity, edema, etc.): VSS    Exercises     Assessment/Plan    PT Assessment Patient needs continued PT services  PT Problem List Decreased strength;Decreased activity tolerance;Decreased balance;Decreased mobility;Decreased knowledge of use of DME;Decreased safety awareness;Decreased knowledge of precautions;Pain       PT Treatment Interventions DME instruction;Gait training;Functional mobility training;Stair  training;Therapeutic activities;Therapeutic exercise;Balance training;Patient/family education    PT Goals (Current goals can be found in the Care Plan section)  Acute Rehab PT Goals Patient Stated Goal: Home today PT Goal Formulation: With patient Time For Goal Achievement: 01/25/22 Potential to Achieve Goals: Good    Frequency Min 5X/week     Co-evaluation               AM-PAC PT "6 Clicks" Mobility  Outcome Measure Help needed turning from your back to your side while in a flat bed without using bedrails?: A Little Help needed moving from lying on your back to sitting on the side of a flat bed without using bedrails?: A Little Help needed moving to and from a bed to a chair (including a wheelchair)?: A Little Help needed standing up from a chair using your arms (e.g., wheelchair or bedside chair)?: A Little Help needed to walk in hospital room?: A Little Help needed climbing 3-5 steps with a railing? : A Little 6 Click Score: 18    End of Session Equipment Utilized During Treatment: Gait belt Activity Tolerance: Patient tolerated treatment well Patient left: in bed;with call bell/phone within reach Nurse Communication: Mobility status PT Visit Diagnosis: Unsteadiness on feet (R26.81);Pain Pain - part of body:  (back)    Time: 0626-9485 PT Time Calculation (min) (ACUTE ONLY): 18 min   Charges:   PT Evaluation $PT Eval Low Complexity: 1 Low          Rolinda Roan, PT, DPT Acute Rehabilitation Services Secure Chat Preferred Office: [phone]   Clarene Duke  Kori Colin 01/18/2022, 10:28 AM

## 2022-01-18 NOTE — Plan of Care (Signed)
  Problem: Education: Goal: Ability to verbalize activity precautions or restrictions will improve Outcome: Completed/Met Goal: Knowledge of the prescribed therapeutic regimen will improve Outcome: Completed/Met Goal: Understanding of discharge needs will improve Outcome: Completed/Met   Problem: Activity: Goal: Ability to avoid complications of mobility impairment will improve Outcome: Completed/Met Goal: Ability to tolerate increased activity will improve Outcome: Completed/Met Goal: Will remain free from falls Outcome: Completed/Met   Problem: Bowel/Gastric: Goal: Gastrointestinal status for postoperative course will improve Outcome: Completed/Met   Problem: Clinical Measurements: Goal: Ability to maintain clinical measurements within normal limits will improve Outcome: Completed/Met Goal: Postoperative complications will be avoided or minimized Outcome: Completed/Met Goal: Diagnostic test results will improve Outcome: Completed/Met   Problem: Clinical Measurements: Goal: Ability to maintain clinical measurements within normal limits will improve Outcome: Completed/Met Goal: Postoperative complications will be avoided or minimized Outcome: Completed/Met Goal: Diagnostic test results will improve Outcome: Completed/Met   Problem: Clinical Measurements: Goal: Ability to maintain clinical measurements within normal limits will improve Outcome: Completed/Met Goal: Postoperative complications will be avoided or minimized Outcome: Completed/Met Goal: Diagnostic test results will improve Outcome: Completed/Met   Problem: Pain Management: Goal: Pain level will decrease Outcome: Completed/Met   Problem: Pain Management: Goal: Pain level will decrease Outcome: Completed/Met   Problem: Skin Integrity: Goal: Will show signs of wound healing Outcome: Completed/Met   Problem: Health Behavior/Discharge Planning: Goal: Identification of resources available to assist in  meeting health care needs will improve Outcome: Completed/Met   Problem: Bladder/Genitourinary: Goal: Urinary functional status for postoperative course will improve Outcome: Completed/Met  Patient alert and oriented ambulate, void, surgical site clean and dry. D/c instructions explain and given to the patient. Will d/c patient home per order.

## 2022-01-18 NOTE — Progress Notes (Signed)
Neurosurgery Service Progress Note  Subjective: No acute events overnight, back doing well, just complaining of muscle soreness, no new radicular symptoms, ambulating well w/ OT this morning   Objective: Vitals:   01/17/22 1942 01/17/22 2303 01/18/22 0334 01/18/22 0746  BP: (!) 148/77 102/70 118/68 129/80  Pulse: 100 76 73 88  Resp: '18 18 18 20  '$ Temp: 99.6 F (37.6 C) 98.5 F (36.9 C) 98.3 F (36.8 C)   TempSrc: Oral Oral Oral   SpO2: 96% 94% 99% 100%  Weight:      Height:        Physical Exam: Strength 5/5 x4 and SILTx4 except b/l stocking glove numbness  Assessment & Plan: 72 y.o. man s/p 2 level open lami/TLIF/PLF, recovering well.  -discharge home today  Judith Part  01/18/22 8:55 AM

## 2022-01-18 NOTE — Discharge Summary (Signed)
Discharge Summary  Date of Admission: 01/17/2022  Date of Discharge: 01/18/22  Attending Physician: Emelda Brothers, MD  Hospital Course: Patient was admitted following an uncomplicated I7-1/8-5 decompression and PSIF. They were recovered in PACU and transferred to [facility]. Their preop symptoms were improved, their hospital course was uncomplicated and the patient was discharged home. They will follow up in clinic with me in clinic in 2 weeks.  Neurologic exam at discharge:  Strength 5/5 x4 and SILTx4 except b/l stocking glove numbness  Discharge diagnosis: Lumbar spondylolisthesis  Judith Part, MD 01/18/22 8:56 AM

## 2022-01-21 ENCOUNTER — Telehealth: Payer: Self-pay | Admitting: Neurology

## 2022-01-21 NOTE — Telephone Encounter (Signed)
[employer] pending uploaded notes on the portal  

## 2022-01-23 NOTE — Telephone Encounter (Signed)
Rec'd auth from insurance. [employer] or [employer]- [phone] 1/8-04/21/2022  LVM for pt to call us back when he is ready to schedule

## 2022-01-29 NOTE — Telephone Encounter (Signed)
LVM for pt to call back to schedule sleep study.   We have attempted to call the patient two times to schedule sleep study.  Patient has been unavailable at the phone numbers we have on file and has not returned our calls.  If patient calls back we will schedule them for their sleep study.

## 2022-02-28 DIAGNOSIS — L509 Urticaria, unspecified: Secondary | ICD-10-CM | POA: Diagnosis not present

## 2022-02-28 DIAGNOSIS — M48062 Spinal stenosis, lumbar region with neurogenic claudication: Secondary | ICD-10-CM | POA: Diagnosis not present

## 2022-02-28 DIAGNOSIS — Z8546 Personal history of malignant neoplasm of prostate: Secondary | ICD-10-CM | POA: Diagnosis not present

## 2022-03-07 DIAGNOSIS — R3915 Urgency of urination: Secondary | ICD-10-CM | POA: Diagnosis not present

## 2022-03-07 DIAGNOSIS — R351 Nocturia: Secondary | ICD-10-CM | POA: Diagnosis not present

## 2022-03-21 DIAGNOSIS — L820 Inflamed seborrheic keratosis: Secondary | ICD-10-CM | POA: Diagnosis not present

## 2022-03-21 DIAGNOSIS — L218 Other seborrheic dermatitis: Secondary | ICD-10-CM | POA: Diagnosis not present

## 2022-03-21 DIAGNOSIS — D692 Other nonthrombocytopenic purpura: Secondary | ICD-10-CM | POA: Diagnosis not present

## 2022-03-21 DIAGNOSIS — D1723 Benign lipomatous neoplasm of skin and subcutaneous tissue of right leg: Secondary | ICD-10-CM | POA: Diagnosis not present

## 2022-03-21 DIAGNOSIS — Z85828 Personal history of other malignant neoplasm of skin: Secondary | ICD-10-CM | POA: Diagnosis not present

## 2022-03-21 DIAGNOSIS — D225 Melanocytic nevi of trunk: Secondary | ICD-10-CM | POA: Diagnosis not present

## 2022-03-21 DIAGNOSIS — L821 Other seborrheic keratosis: Secondary | ICD-10-CM | POA: Diagnosis not present

## 2022-04-01 DIAGNOSIS — M545 Low back pain, unspecified: Secondary | ICD-10-CM | POA: Diagnosis not present

## 2022-04-01 DIAGNOSIS — R351 Nocturia: Secondary | ICD-10-CM | POA: Diagnosis not present

## 2022-04-01 DIAGNOSIS — R3915 Urgency of urination: Secondary | ICD-10-CM | POA: Diagnosis not present

## 2022-04-05 DIAGNOSIS — M7062 Trochanteric bursitis, left hip: Secondary | ICD-10-CM | POA: Diagnosis not present

## 2022-04-08 DIAGNOSIS — Z125 Encounter for screening for malignant neoplasm of prostate: Secondary | ICD-10-CM | POA: Diagnosis not present

## 2022-04-08 DIAGNOSIS — E785 Hyperlipidemia, unspecified: Secondary | ICD-10-CM | POA: Diagnosis not present

## 2022-04-08 DIAGNOSIS — M109 Gout, unspecified: Secondary | ICD-10-CM | POA: Diagnosis not present

## 2022-04-08 DIAGNOSIS — R7989 Other specified abnormal findings of blood chemistry: Secondary | ICD-10-CM | POA: Diagnosis not present

## 2022-04-08 DIAGNOSIS — E1149 Type 2 diabetes mellitus with other diabetic neurological complication: Secondary | ICD-10-CM | POA: Diagnosis not present

## 2022-04-08 DIAGNOSIS — I10 Essential (primary) hypertension: Secondary | ICD-10-CM | POA: Diagnosis not present

## 2022-04-10 ENCOUNTER — Encounter: Payer: Self-pay | Admitting: Podiatry

## 2022-04-10 ENCOUNTER — Ambulatory Visit (INDEPENDENT_AMBULATORY_CARE_PROVIDER_SITE_OTHER): Payer: Medicare PPO | Admitting: Podiatry

## 2022-04-10 DIAGNOSIS — B351 Tinea unguium: Secondary | ICD-10-CM | POA: Diagnosis not present

## 2022-04-10 DIAGNOSIS — M79674 Pain in right toe(s): Secondary | ICD-10-CM

## 2022-04-10 DIAGNOSIS — E1121 Type 2 diabetes mellitus with diabetic nephropathy: Secondary | ICD-10-CM

## 2022-04-10 DIAGNOSIS — M79675 Pain in left toe(s): Secondary | ICD-10-CM | POA: Diagnosis not present

## 2022-04-10 DIAGNOSIS — M7062 Trochanteric bursitis, left hip: Secondary | ICD-10-CM | POA: Diagnosis not present

## 2022-04-10 NOTE — Progress Notes (Signed)
This patient returns to my office for at risk foot care.  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  This patient is unable to cut nails himself since the patient cannot reach his nails.These nails are painful walking and wearing shoes.  This patient presents for at risk foot care today.  General Appearance  Alert, conversant and in no acute stress.  Vascular  Dorsalis pedis and posterior tibial  pulses are palpable  bilaterally.  Capillary return is within normal limits  bilaterally. Temperature is within normal limits  bilaterally.  Neurologic  Senn-Weinstein monofilament wire test within normal limits  bilaterally. Muscle power within normal limits bilaterally.  Nails Thick disfigured discolored nails with subungual debris  from hallux to fifth toes bilaterally. No evidence of bacterial infection or drainage bilaterally.  Hallux nail left foot is completely unattached to nail bed right hallux.  Orthopedic  No limitations of motion  feet .  No crepitus or effusions noted.  No bony pathology or digital deformities noted.  Asymptomatic  HAV.  Limited ROM STJ right foot.  Skin  normotropic skin with no porokeratosis noted bilaterally.  No signs of infections or ulcers noted.     Onychomycosis  Pain in right toes  Pain in left toes  Consent was obtained for treatment procedures.   Mechanical debridement of nails 1-5  bilaterally performed with a nail nipper.  Filed with dremel without incident. Removal of left hallux toenail.   Return office visit    3 months                  Told patient to return for periodic foot care and evaluation due to potential at risk complications.   Talmadge Grant Swager DPM  

## 2022-04-14 DIAGNOSIS — Z1212 Encounter for screening for malignant neoplasm of rectum: Secondary | ICD-10-CM | POA: Diagnosis not present

## 2022-04-15 DIAGNOSIS — C61 Malignant neoplasm of prostate: Secondary | ICD-10-CM | POA: Diagnosis not present

## 2022-04-15 DIAGNOSIS — E1151 Type 2 diabetes mellitus with diabetic peripheral angiopathy without gangrene: Secondary | ICD-10-CM | POA: Diagnosis not present

## 2022-04-15 DIAGNOSIS — I10 Essential (primary) hypertension: Secondary | ICD-10-CM | POA: Diagnosis not present

## 2022-04-15 DIAGNOSIS — Z0000 Encounter for general adult medical examination without abnormal findings: Secondary | ICD-10-CM | POA: Diagnosis not present

## 2022-04-15 DIAGNOSIS — E785 Hyperlipidemia, unspecified: Secondary | ICD-10-CM | POA: Diagnosis not present

## 2022-04-15 DIAGNOSIS — R82998 Other abnormal findings in urine: Secondary | ICD-10-CM | POA: Diagnosis not present

## 2022-04-15 DIAGNOSIS — E1140 Type 2 diabetes mellitus with diabetic neuropathy, unspecified: Secondary | ICD-10-CM | POA: Diagnosis not present

## 2022-04-15 DIAGNOSIS — Z1339 Encounter for screening examination for other mental health and behavioral disorders: Secondary | ICD-10-CM | POA: Diagnosis not present

## 2022-04-15 DIAGNOSIS — E1149 Type 2 diabetes mellitus with other diabetic neurological complication: Secondary | ICD-10-CM | POA: Diagnosis not present

## 2022-04-15 DIAGNOSIS — E1121 Type 2 diabetes mellitus with diabetic nephropathy: Secondary | ICD-10-CM | POA: Diagnosis not present

## 2022-04-15 DIAGNOSIS — Z1331 Encounter for screening for depression: Secondary | ICD-10-CM | POA: Diagnosis not present

## 2022-04-16 DIAGNOSIS — R3915 Urgency of urination: Secondary | ICD-10-CM | POA: Diagnosis not present

## 2022-04-16 DIAGNOSIS — R351 Nocturia: Secondary | ICD-10-CM | POA: Diagnosis not present

## 2022-04-19 DIAGNOSIS — M25559 Pain in unspecified hip: Secondary | ICD-10-CM | POA: Diagnosis not present

## 2022-04-23 DIAGNOSIS — M48062 Spinal stenosis, lumbar region with neurogenic claudication: Secondary | ICD-10-CM | POA: Diagnosis not present

## 2022-04-23 DIAGNOSIS — R2681 Unsteadiness on feet: Secondary | ICD-10-CM | POA: Diagnosis not present

## 2022-04-23 DIAGNOSIS — E1121 Type 2 diabetes mellitus with diabetic nephropathy: Secondary | ICD-10-CM | POA: Diagnosis not present

## 2022-04-23 DIAGNOSIS — G4762 Sleep related leg cramps: Secondary | ICD-10-CM | POA: Diagnosis not present

## 2022-04-23 DIAGNOSIS — R1031 Right lower quadrant pain: Secondary | ICD-10-CM | POA: Diagnosis not present

## 2022-05-07 DIAGNOSIS — M545 Low back pain, unspecified: Secondary | ICD-10-CM | POA: Diagnosis not present

## 2022-05-07 DIAGNOSIS — R269 Unspecified abnormalities of gait and mobility: Secondary | ICD-10-CM | POA: Diagnosis not present

## 2022-05-22 DIAGNOSIS — R269 Unspecified abnormalities of gait and mobility: Secondary | ICD-10-CM | POA: Diagnosis not present

## 2022-05-22 DIAGNOSIS — M545 Low back pain, unspecified: Secondary | ICD-10-CM | POA: Diagnosis not present

## 2022-05-24 DIAGNOSIS — R269 Unspecified abnormalities of gait and mobility: Secondary | ICD-10-CM | POA: Diagnosis not present

## 2022-05-24 DIAGNOSIS — M545 Low back pain, unspecified: Secondary | ICD-10-CM | POA: Diagnosis not present

## 2022-05-27 DIAGNOSIS — E11621 Type 2 diabetes mellitus with foot ulcer: Secondary | ICD-10-CM | POA: Diagnosis not present

## 2022-05-27 DIAGNOSIS — L97512 Non-pressure chronic ulcer of other part of right foot with fat layer exposed: Secondary | ICD-10-CM | POA: Diagnosis not present

## 2022-05-27 DIAGNOSIS — E1140 Type 2 diabetes mellitus with diabetic neuropathy, unspecified: Secondary | ICD-10-CM | POA: Diagnosis not present

## 2022-05-29 DIAGNOSIS — R269 Unspecified abnormalities of gait and mobility: Secondary | ICD-10-CM | POA: Diagnosis not present

## 2022-05-29 DIAGNOSIS — M545 Low back pain, unspecified: Secondary | ICD-10-CM | POA: Diagnosis not present

## 2022-05-31 DIAGNOSIS — R269 Unspecified abnormalities of gait and mobility: Secondary | ICD-10-CM | POA: Diagnosis not present

## 2022-05-31 DIAGNOSIS — M545 Low back pain, unspecified: Secondary | ICD-10-CM | POA: Diagnosis not present

## 2022-06-05 DIAGNOSIS — M545 Low back pain, unspecified: Secondary | ICD-10-CM | POA: Diagnosis not present

## 2022-06-05 DIAGNOSIS — R269 Unspecified abnormalities of gait and mobility: Secondary | ICD-10-CM | POA: Diagnosis not present

## 2022-06-07 DIAGNOSIS — R269 Unspecified abnormalities of gait and mobility: Secondary | ICD-10-CM | POA: Diagnosis not present

## 2022-06-07 DIAGNOSIS — M545 Low back pain, unspecified: Secondary | ICD-10-CM | POA: Diagnosis not present

## 2022-06-12 DIAGNOSIS — R269 Unspecified abnormalities of gait and mobility: Secondary | ICD-10-CM | POA: Diagnosis not present

## 2022-06-12 DIAGNOSIS — M545 Low back pain, unspecified: Secondary | ICD-10-CM | POA: Diagnosis not present

## 2022-06-14 DIAGNOSIS — Z6834 Body mass index (BMI) 34.0-34.9, adult: Secondary | ICD-10-CM | POA: Diagnosis not present

## 2022-06-14 DIAGNOSIS — M7062 Trochanteric bursitis, left hip: Secondary | ICD-10-CM | POA: Diagnosis not present

## 2022-06-14 DIAGNOSIS — R2689 Other abnormalities of gait and mobility: Secondary | ICD-10-CM | POA: Diagnosis not present

## 2022-06-14 DIAGNOSIS — R269 Unspecified abnormalities of gait and mobility: Secondary | ICD-10-CM | POA: Diagnosis not present

## 2022-06-14 DIAGNOSIS — M4326 Fusion of spine, lumbar region: Secondary | ICD-10-CM | POA: Diagnosis not present

## 2022-06-14 DIAGNOSIS — M545 Low back pain, unspecified: Secondary | ICD-10-CM | POA: Diagnosis not present

## 2022-06-20 DIAGNOSIS — R269 Unspecified abnormalities of gait and mobility: Secondary | ICD-10-CM | POA: Diagnosis not present

## 2022-06-20 DIAGNOSIS — M545 Low back pain, unspecified: Secondary | ICD-10-CM | POA: Diagnosis not present

## 2022-06-20 DIAGNOSIS — R2689 Other abnormalities of gait and mobility: Secondary | ICD-10-CM | POA: Diagnosis not present

## 2022-07-04 DIAGNOSIS — D2271 Melanocytic nevi of right lower limb, including hip: Secondary | ICD-10-CM | POA: Diagnosis not present

## 2022-07-04 DIAGNOSIS — D225 Melanocytic nevi of trunk: Secondary | ICD-10-CM | POA: Diagnosis not present

## 2022-07-04 DIAGNOSIS — L570 Actinic keratosis: Secondary | ICD-10-CM | POA: Diagnosis not present

## 2022-07-04 DIAGNOSIS — L218 Other seborrheic dermatitis: Secondary | ICD-10-CM | POA: Diagnosis not present

## 2022-07-04 DIAGNOSIS — Z85828 Personal history of other malignant neoplasm of skin: Secondary | ICD-10-CM | POA: Diagnosis not present

## 2022-07-04 DIAGNOSIS — L821 Other seborrheic keratosis: Secondary | ICD-10-CM | POA: Diagnosis not present

## 2022-07-10 DIAGNOSIS — R2689 Other abnormalities of gait and mobility: Secondary | ICD-10-CM | POA: Diagnosis not present

## 2022-07-10 DIAGNOSIS — R269 Unspecified abnormalities of gait and mobility: Secondary | ICD-10-CM | POA: Diagnosis not present

## 2022-07-10 DIAGNOSIS — M545 Low back pain, unspecified: Secondary | ICD-10-CM | POA: Diagnosis not present

## 2022-07-11 ENCOUNTER — Ambulatory Visit (INDEPENDENT_AMBULATORY_CARE_PROVIDER_SITE_OTHER): Payer: Medicare PPO | Admitting: Podiatry

## 2022-07-11 ENCOUNTER — Encounter: Payer: Self-pay | Admitting: Podiatry

## 2022-07-11 DIAGNOSIS — M79675 Pain in left toe(s): Secondary | ICD-10-CM

## 2022-07-11 DIAGNOSIS — M79674 Pain in right toe(s): Secondary | ICD-10-CM

## 2022-07-11 DIAGNOSIS — E1121 Type 2 diabetes mellitus with diabetic nephropathy: Secondary | ICD-10-CM | POA: Diagnosis not present

## 2022-07-11 DIAGNOSIS — S90821S Blister (nonthermal), right foot, sequela: Secondary | ICD-10-CM | POA: Diagnosis not present

## 2022-07-11 DIAGNOSIS — L089 Local infection of the skin and subcutaneous tissue, unspecified: Secondary | ICD-10-CM

## 2022-07-11 DIAGNOSIS — B351 Tinea unguium: Secondary | ICD-10-CM | POA: Diagnosis not present

## 2022-07-11 NOTE — Progress Notes (Signed)
This patient returns to my office for at risk foot care.  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  This patient is unable to cut nails himself since the patient cannot reach his nails.These nails are painful walking and wearing shoes.  This patient presents for at risk foot care today.  General Appearance  Alert, conversant and in no acute stress.  Vascular  Dorsalis pedis and posterior tibial  pulses are palpable  bilaterally.  Capillary return is within normal limits  bilaterally. Temperature is within normal limits  bilaterally.  Neurologic  Senn-Weinstein monofilament wire test within normal limits  bilaterally. Muscle power within normal limits bilaterally.  Nails Thick disfigured discolored nails with subungual debris  from hallux to fifth toes bilaterally. No evidence of bacterial infection or drainage bilaterally.  Hallux nail left foot is completely unattached to nail bed right hallux.  Orthopedic  No limitations of motion  feet .  No crepitus or effusions noted.  No bony pathology or digital deformities noted.  Asymptomatic  HAV.  Limited ROM STJ right foot.  Skin  normotropic skin with no porokeratosis noted bilaterally.  No signs of infections or ulcers noted.   Healing skin distal aspect third toe right foot.  Onychomycosis  Pain in right toes  Pain in left toes  Infection 3rd toe left foot.  Consent was obtained for treatment procedures.   Mechanical debridement of nails 1-5  bilaterally performed with a nail nipper.  Filed with dremel without incident. Healing third toe right foot infection  .Padding dispensed.   Return office visit    3 months                  Told patient to return for periodic foot care and evaluation due to potential at risk complications.   Helane Gunther DPM

## 2022-07-17 DIAGNOSIS — R269 Unspecified abnormalities of gait and mobility: Secondary | ICD-10-CM | POA: Diagnosis not present

## 2022-07-17 DIAGNOSIS — M545 Low back pain, unspecified: Secondary | ICD-10-CM | POA: Diagnosis not present

## 2022-07-17 DIAGNOSIS — R2689 Other abnormalities of gait and mobility: Secondary | ICD-10-CM | POA: Diagnosis not present

## 2022-07-19 DIAGNOSIS — R269 Unspecified abnormalities of gait and mobility: Secondary | ICD-10-CM | POA: Diagnosis not present

## 2022-07-19 DIAGNOSIS — M545 Low back pain, unspecified: Secondary | ICD-10-CM | POA: Diagnosis not present

## 2022-07-19 DIAGNOSIS — R2689 Other abnormalities of gait and mobility: Secondary | ICD-10-CM | POA: Diagnosis not present

## 2022-07-24 DIAGNOSIS — R2689 Other abnormalities of gait and mobility: Secondary | ICD-10-CM | POA: Diagnosis not present

## 2022-07-24 DIAGNOSIS — R269 Unspecified abnormalities of gait and mobility: Secondary | ICD-10-CM | POA: Diagnosis not present

## 2022-07-24 DIAGNOSIS — M545 Low back pain, unspecified: Secondary | ICD-10-CM | POA: Diagnosis not present

## 2022-07-26 DIAGNOSIS — R2689 Other abnormalities of gait and mobility: Secondary | ICD-10-CM | POA: Diagnosis not present

## 2022-07-26 DIAGNOSIS — R269 Unspecified abnormalities of gait and mobility: Secondary | ICD-10-CM | POA: Diagnosis not present

## 2022-07-26 DIAGNOSIS — M545 Low back pain, unspecified: Secondary | ICD-10-CM | POA: Diagnosis not present

## 2022-07-31 DIAGNOSIS — M545 Low back pain, unspecified: Secondary | ICD-10-CM | POA: Diagnosis not present

## 2022-07-31 DIAGNOSIS — R2689 Other abnormalities of gait and mobility: Secondary | ICD-10-CM | POA: Diagnosis not present

## 2022-08-20 DIAGNOSIS — M7062 Trochanteric bursitis, left hip: Secondary | ICD-10-CM | POA: Diagnosis not present

## 2022-08-20 DIAGNOSIS — M899 Disorder of bone, unspecified: Secondary | ICD-10-CM | POA: Diagnosis not present

## 2022-08-20 DIAGNOSIS — M160 Bilateral primary osteoarthritis of hip: Secondary | ICD-10-CM | POA: Diagnosis not present

## 2022-08-20 DIAGNOSIS — M7061 Trochanteric bursitis, right hip: Secondary | ICD-10-CM | POA: Diagnosis not present

## 2022-08-29 DIAGNOSIS — M25551 Pain in right hip: Secondary | ICD-10-CM | POA: Diagnosis not present

## 2022-08-29 DIAGNOSIS — M25552 Pain in left hip: Secondary | ICD-10-CM | POA: Diagnosis not present

## 2022-09-02 DIAGNOSIS — M25551 Pain in right hip: Secondary | ICD-10-CM | POA: Diagnosis not present

## 2022-09-02 DIAGNOSIS — M25552 Pain in left hip: Secondary | ICD-10-CM | POA: Diagnosis not present

## 2022-09-05 ENCOUNTER — Ambulatory Visit (INDEPENDENT_AMBULATORY_CARE_PROVIDER_SITE_OTHER): Payer: Medicare PPO | Admitting: Podiatry

## 2022-09-05 DIAGNOSIS — M25551 Pain in right hip: Secondary | ICD-10-CM | POA: Diagnosis not present

## 2022-09-05 DIAGNOSIS — L089 Local infection of the skin and subcutaneous tissue, unspecified: Secondary | ICD-10-CM | POA: Diagnosis not present

## 2022-09-05 DIAGNOSIS — L97509 Non-pressure chronic ulcer of other part of unspecified foot with unspecified severity: Secondary | ICD-10-CM | POA: Insufficient documentation

## 2022-09-05 DIAGNOSIS — L97511 Non-pressure chronic ulcer of other part of right foot limited to breakdown of skin: Secondary | ICD-10-CM | POA: Diagnosis not present

## 2022-09-05 DIAGNOSIS — M25552 Pain in left hip: Secondary | ICD-10-CM | POA: Diagnosis not present

## 2022-09-05 MED ORDER — DOXYCYCLINE HYCLATE 100 MG PO TABS: 100.0000 mg | ORAL_TABLET | Freq: Two times a day (BID) | ORAL | 0 refills | Status: AC

## 2022-09-05 MED ORDER — DOXYCYCLINE HYCLATE 100 MG PO TABS: 100.0000 mg | ORAL_TABLET | Freq: Two times a day (BID) | ORAL | 0 refills | Status: DC

## 2022-09-05 NOTE — Progress Notes (Signed)
This patient returns to my office for  an evaluation of his third toe right foot.  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  He says he has developed an open wound on the tip of third toe due to increased activity.  He has applied bactoban to the open wound site.  He presents to the office for evaluation and treatment.    General Appearance  Alert, conversant and in no acute stress.  Vascular  Dorsalis pedis and posterior tibial  pulses are weaky  palpable  bilaterally.  Capillary return is within normal limits  bilaterally. Temperature is within normal limits  bilaterally.  Neurologic  Senn-Weinstein monofilament wire test within normal limits  bilaterally. Muscle power within normal limits bilaterally.  Nails Thick disfigured discolored nails with subungual debris  from hallux to fifth toes bilaterally. No evidence of bacterial infection or drainage bilaterally.    Orthopedic  No limitations of motion  feet .  No crepitus or effusions noted.  No bony pathology or digital deformities noted.  Asymptomatic  HAV.  Limited ROM STJ right foot.  Skin  normotropic skin with no porokeratosis noted bilaterally.  No signs of infections or ulcers noted.   Open wound tip of third toe right foot.  Redness and swelling over the dorsal distal aspect third toe right foot.  Infected ulcer third toe right foot.  Debride necrotic tissue third toe right foot.  Home instructions using peroxide.  Neosporin/DSD.  Prescribe doxycycline.   Return office visit    prn               Told patient to return for periodic foot care and evaluation due to potential at risk complications.   Helane Gunther DPM

## 2022-09-09 DIAGNOSIS — M25552 Pain in left hip: Secondary | ICD-10-CM | POA: Diagnosis not present

## 2022-09-09 DIAGNOSIS — M25551 Pain in right hip: Secondary | ICD-10-CM | POA: Diagnosis not present

## 2022-09-18 DIAGNOSIS — M4326 Fusion of spine, lumbar region: Secondary | ICD-10-CM | POA: Diagnosis not present

## 2022-09-18 DIAGNOSIS — M25551 Pain in right hip: Secondary | ICD-10-CM | POA: Diagnosis not present

## 2022-09-18 DIAGNOSIS — M25552 Pain in left hip: Secondary | ICD-10-CM | POA: Diagnosis not present

## 2022-09-18 DIAGNOSIS — Z6833 Body mass index (BMI) 33.0-33.9, adult: Secondary | ICD-10-CM | POA: Diagnosis not present

## 2022-09-18 DIAGNOSIS — M7062 Trochanteric bursitis, left hip: Secondary | ICD-10-CM | POA: Diagnosis not present

## 2022-09-20 DIAGNOSIS — M25551 Pain in right hip: Secondary | ICD-10-CM | POA: Diagnosis not present

## 2022-09-20 DIAGNOSIS — M25552 Pain in left hip: Secondary | ICD-10-CM | POA: Diagnosis not present

## 2022-09-23 DIAGNOSIS — C61 Malignant neoplasm of prostate: Secondary | ICD-10-CM | POA: Diagnosis not present

## 2022-09-25 DIAGNOSIS — M25551 Pain in right hip: Secondary | ICD-10-CM | POA: Diagnosis not present

## 2022-09-27 DIAGNOSIS — M25552 Pain in left hip: Secondary | ICD-10-CM | POA: Diagnosis not present

## 2022-09-27 DIAGNOSIS — M25551 Pain in right hip: Secondary | ICD-10-CM | POA: Diagnosis not present

## 2022-09-30 DIAGNOSIS — C61 Malignant neoplasm of prostate: Secondary | ICD-10-CM | POA: Diagnosis not present

## 2022-09-30 DIAGNOSIS — R3915 Urgency of urination: Secondary | ICD-10-CM | POA: Diagnosis not present

## 2022-09-30 DIAGNOSIS — N5201 Erectile dysfunction due to arterial insufficiency: Secondary | ICD-10-CM | POA: Diagnosis not present

## 2022-09-30 DIAGNOSIS — N393 Stress incontinence (female) (male): Secondary | ICD-10-CM | POA: Diagnosis not present

## 2022-10-02 DIAGNOSIS — M25552 Pain in left hip: Secondary | ICD-10-CM | POA: Diagnosis not present

## 2022-10-02 DIAGNOSIS — M25551 Pain in right hip: Secondary | ICD-10-CM | POA: Diagnosis not present

## 2022-10-04 DIAGNOSIS — M7061 Trochanteric bursitis, right hip: Secondary | ICD-10-CM | POA: Diagnosis not present

## 2022-10-04 DIAGNOSIS — M7062 Trochanteric bursitis, left hip: Secondary | ICD-10-CM | POA: Diagnosis not present

## 2022-10-07 DIAGNOSIS — M25551 Pain in right hip: Secondary | ICD-10-CM | POA: Diagnosis not present

## 2022-10-07 DIAGNOSIS — M25552 Pain in left hip: Secondary | ICD-10-CM | POA: Diagnosis not present

## 2022-10-09 DIAGNOSIS — M25552 Pain in left hip: Secondary | ICD-10-CM | POA: Diagnosis not present

## 2022-10-09 DIAGNOSIS — M25551 Pain in right hip: Secondary | ICD-10-CM | POA: Diagnosis not present

## 2022-10-11 ENCOUNTER — Ambulatory Visit (INDEPENDENT_AMBULATORY_CARE_PROVIDER_SITE_OTHER): Payer: Medicare PPO | Admitting: Podiatry

## 2022-10-11 ENCOUNTER — Encounter: Payer: Self-pay | Admitting: Podiatry

## 2022-10-11 DIAGNOSIS — M79674 Pain in right toe(s): Secondary | ICD-10-CM

## 2022-10-11 DIAGNOSIS — M79675 Pain in left toe(s): Secondary | ICD-10-CM | POA: Diagnosis not present

## 2022-10-11 DIAGNOSIS — E1121 Type 2 diabetes mellitus with diabetic nephropathy: Secondary | ICD-10-CM

## 2022-10-11 DIAGNOSIS — B351 Tinea unguium: Secondary | ICD-10-CM | POA: Diagnosis not present

## 2022-10-11 NOTE — Progress Notes (Signed)
This patient returns to my office for  an evaluation of his third toe right foot.  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  He says he has developed an open wound on the tip of third toe due to increased activity.  He has applied bactoban to the open wound site.  He presents to the office for evaluation and treatment.    General Appearance  Alert, conversant and in no acute stress.  Vascular  Dorsalis pedis and posterior tibial  pulses are weaky  palpable  bilaterally.  Capillary return is within normal limits  bilaterally. Temperature is within normal limits  bilaterally.  Neurologic  Senn-Weinstein monofilament wire test within normal limits  bilaterally. Muscle power within normal limits bilaterally.  Nails Thick disfigured discolored nails with subungual debris  from hallux to fifth toes bilaterally. No evidence of bacterial infection or drainage bilaterally.    Orthopedic  No limitations of motion  feet .  No crepitus or effusions noted.  No bony pathology or digital deformities noted.  Asymptomatic  HAV.  Limited ROM STJ right foot.  Skin  normotropic skin with no porokeratosis noted bilaterally.  No signs of infections or ulcers noted.   Open wound tip of third toe right foot.  Redness and swelling over the dorsal distal aspect third toe right foot. No infection or drainage noted.    Onychomycosis  Debride nails    Return office visit    3 months               Told patient to return for periodic foot care and evaluation due to potential at risk complications.   Helane Gunther DPM

## 2022-10-14 DIAGNOSIS — H04123 Dry eye syndrome of bilateral lacrimal glands: Secondary | ICD-10-CM | POA: Diagnosis not present

## 2022-10-14 DIAGNOSIS — H40013 Open angle with borderline findings, low risk, bilateral: Secondary | ICD-10-CM | POA: Diagnosis not present

## 2022-10-14 DIAGNOSIS — E119 Type 2 diabetes mellitus without complications: Secondary | ICD-10-CM | POA: Diagnosis not present

## 2022-10-14 DIAGNOSIS — H35373 Puckering of macula, bilateral: Secondary | ICD-10-CM | POA: Diagnosis not present

## 2022-10-18 DIAGNOSIS — M25551 Pain in right hip: Secondary | ICD-10-CM | POA: Diagnosis not present

## 2022-10-18 DIAGNOSIS — M25552 Pain in left hip: Secondary | ICD-10-CM | POA: Diagnosis not present

## 2022-10-21 DIAGNOSIS — E1121 Type 2 diabetes mellitus with diabetic nephropathy: Secondary | ICD-10-CM | POA: Diagnosis not present

## 2022-10-21 DIAGNOSIS — I739 Peripheral vascular disease, unspecified: Secondary | ICD-10-CM | POA: Diagnosis not present

## 2022-10-21 DIAGNOSIS — I10 Essential (primary) hypertension: Secondary | ICD-10-CM | POA: Diagnosis not present

## 2022-10-21 DIAGNOSIS — E785 Hyperlipidemia, unspecified: Secondary | ICD-10-CM | POA: Diagnosis not present

## 2022-10-21 DIAGNOSIS — E1151 Type 2 diabetes mellitus with diabetic peripheral angiopathy without gangrene: Secondary | ICD-10-CM | POA: Diagnosis not present

## 2022-10-21 DIAGNOSIS — L97512 Non-pressure chronic ulcer of other part of right foot with fat layer exposed: Secondary | ICD-10-CM | POA: Diagnosis not present

## 2022-10-23 DIAGNOSIS — M25551 Pain in right hip: Secondary | ICD-10-CM | POA: Diagnosis not present

## 2022-10-23 DIAGNOSIS — M25552 Pain in left hip: Secondary | ICD-10-CM | POA: Diagnosis not present

## 2022-11-05 ENCOUNTER — Ambulatory Visit (INDEPENDENT_AMBULATORY_CARE_PROVIDER_SITE_OTHER): Payer: Medicare PPO | Admitting: Podiatry

## 2022-11-05 ENCOUNTER — Ambulatory Visit (INDEPENDENT_AMBULATORY_CARE_PROVIDER_SITE_OTHER): Payer: Medicare PPO

## 2022-11-05 DIAGNOSIS — L97511 Non-pressure chronic ulcer of other part of right foot limited to breakdown of skin: Secondary | ICD-10-CM | POA: Diagnosis not present

## 2022-11-05 DIAGNOSIS — E11628 Type 2 diabetes mellitus with other skin complications: Secondary | ICD-10-CM

## 2022-11-05 DIAGNOSIS — L089 Local infection of the skin and subcutaneous tissue, unspecified: Secondary | ICD-10-CM | POA: Diagnosis not present

## 2022-11-05 DIAGNOSIS — L97514 Non-pressure chronic ulcer of other part of right foot with necrosis of bone: Secondary | ICD-10-CM

## 2022-11-05 DIAGNOSIS — M869 Osteomyelitis, unspecified: Secondary | ICD-10-CM

## 2022-11-05 MED ORDER — DOXYCYCLINE HYCLATE 100 MG PO TABS: 100.0000 mg | ORAL_TABLET | Freq: Two times a day (BID) | ORAL | 0 refills | Status: DC

## 2022-11-05 NOTE — Addendum Note (Signed)
Addended by: Guss Bunde L on: 11/05/2022 11:06 AM   Modules accepted: Orders

## 2022-11-05 NOTE — Progress Notes (Signed)
Subjective:  Patient ID: Raymond Herring, male    DOB: 1950/02/16,  MRN: [id number]  Chief Complaint  Patient presents with   Wound Check    Pt presents pt states that says he has developed an open wound on the tip of third toe due to increased activity.    Discussed the use of AI scribe software for clinical note transcription with the patient, who gave verbal consent to proceed.  History of Present Illness   The patient presents with a six-month history of a toe ulceration that began after [place] on the beach and developing a blister. He reports that the ulceration has been worsening, with bleeding starting the previous night. The patient has neuropathy and does not feel any pain in the area. He has been applying mupirocin and rinsing with peroxide daily. He has been on doxycycline for the ulcer previously but is not currently on it. The patient has a history of diabetes, which is well-controlled with an A1c of 5.2-5.3. He does not smoke and has good circulation.          Objective:    Physical Exam   EXTREMITIES: Pes planus deformity, warm and well-perfused right foot with palpable DP and PT pulses. Third toe ulceration at the tip probing directly to bone with erythema to the proximal portion of the toe. No ascending cellulitis, purulence, or malodor. Serous drainage from the toe.       No images are attached to the encounter.    Results   Procedure: X-ray of the right foot Description: Radiographs taken today of the right foot show distal cortical erosion of the distal phalanx of the third toe.  LABS A1c: 5.2-5.3      Assessment:   1. 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  Assessment and Plan    Osteomyelitis of the Right Third Toe   He  exhibits deep ulceration with erythema and serous drainage on the right third toe, probing to bone. An X-ray reveals distal cortical erosion of the distal phalanx. We discussed the risk of worsening infection and the potential need for partial toe amputation. We will continue Doxycycline, obtain a wound culture, and have planned for outpatient digital amputation on 11/15/2022. He has been advised to proceed to the ER if he develops worsening infection, fever, chills, nausea, or vomiting. All risks, benefits and potential complications discussed. All questions addressed. Informed consent signed and reviewed.     Diabetes Mellitus   His Diabetes Mellitus is well-controlled, with a recent A1c between 5.2 and 5.3. We will continue the current management.  Charcot Foot   There is no change in his foot structure or function. We will continue the current management.        Surgical plan:  Procedure: -Right third toe partial amputation  Location: -[facility]  Anesthesia plan: -Sedation with local  Postoperative pain plan: - Tylenol 1000 mg every 6 hours, tramadol 50 mg as needed  DVT prophylaxis: -None required  WB Restrictions / DME needs: -WBAT in surgical shoe    No follow-ups on file.

## 2022-11-08 LAB — WOUND CULTURE
MICRO NUMBER:: 15626817
SPECIMEN QUALITY:: ADEQUATE

## 2022-11-12 ENCOUNTER — Telehealth: Payer: Self-pay | Admitting: Podiatry

## 2022-11-12 NOTE — Telephone Encounter (Signed)
DOS- 11/15/2022  AMPUTATION TOE IPJ 3RD [id number]  [employer] EFFECTIVE DATE-01/14/2022  OOP-$8850.00 WITH REMAINING $7,104.80  COINSURANCE- 0%  PER THE [facility] WEBSITE PORTAL, PRIOR AUTH IS NOT REQUIRED FOR CPT CODE 45409. GOOD FROM 11/15/2022 - 02/13/2023.    AUTH Tracking [id number]

## 2022-11-15 ENCOUNTER — Other Ambulatory Visit: Payer: Self-pay | Admitting: Podiatry

## 2022-11-15 ENCOUNTER — Telehealth: Payer: Self-pay | Admitting: Podiatry

## 2022-11-15 DIAGNOSIS — M86471 Chronic osteomyelitis with draining sinus, right ankle and foot: Secondary | ICD-10-CM | POA: Diagnosis not present

## 2022-11-15 DIAGNOSIS — M86671 Other chronic osteomyelitis, right ankle and foot: Secondary | ICD-10-CM | POA: Diagnosis not present

## 2022-11-15 MED ORDER — DOXYCYCLINE HYCLATE 100 MG PO TABS: 100.0000 mg | ORAL_TABLET | Freq: Two times a day (BID) | ORAL | 0 refills | Status: DC

## 2022-11-15 MED ORDER — TRAMADOL HCL 50 MG PO TABS: 50.0000 mg | ORAL_TABLET | Freq: Four times a day (QID) | ORAL | 0 refills | Status: AC | PRN

## 2022-11-15 MED ORDER — TRAMADOL HCL 50 MG PO TABS: 50.0000 mg | ORAL_TABLET | Freq: Four times a day (QID) | ORAL | 0 refills | Status: DC | PRN

## 2022-11-15 NOTE — Telephone Encounter (Signed)
Hello, this pt called stating the medications that were sent in for him were sent to the wrong pharmacy (He just had surgery). Please send it to the [facility] off of [address].The prescription sent to [facility] will not be covered by the insurance.  Thanks

## 2022-11-15 NOTE — Telephone Encounter (Signed)
Sent to the correct pharmacy. Thanks

## 2022-11-21 ENCOUNTER — Encounter: Payer: Self-pay | Admitting: Podiatry

## 2022-11-21 ENCOUNTER — Ambulatory Visit (INDEPENDENT_AMBULATORY_CARE_PROVIDER_SITE_OTHER): Payer: Medicare PPO | Admitting: Podiatry

## 2022-11-21 DIAGNOSIS — L97514 Non-pressure chronic ulcer of other part of right foot with necrosis of bone: Secondary | ICD-10-CM

## 2022-11-21 MED ORDER — MUPIROCIN 2 % EX OINT: 1.0000 | TOPICAL_OINTMENT | Freq: Every day | CUTANEOUS | 2 refills | Status: DC

## 2022-11-21 NOTE — Progress Notes (Signed)
  Subjective:  Patient ID: Raymond Herring, male    DOB: Nov 10, 1950,  MRN: [id number]  Chief Complaint  Patient presents with   Routine Post Op    PATIENT STATES THAT HE HASN'T HAD NO PAIN AND EVERYTHING HAS BEEN FIND .    DOS: 11/15/2022 Procedure: Right partial third toe amputation  72 y.o. male returns for post-op check.   Review of Systems: Negative except as noted in the HPI. Denies N/V/F/Ch.   Objective:  There were no vitals filed for this visit. There is no height or weight on file to calculate BMI. Constitutional Well developed. Well nourished.  Vascular Foot warm and well perfused. Capillary refill normal to all digits.  Calf is soft and supple, no posterior calf or knee pain, negative Homans' sign  Neurologic Normal speech. Oriented to person, place, and time. Epicritic sensation to light touch grossly absent bilaterally.  Dermatologic Skin healing well without signs of infection. Skin edges well coapted without signs of infection.  Orthopedic: Tenderness to palpation noted about the surgical site.   Assessment:   1. 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  S/p foot surgery right -Progressing as expected post-operatively.  May remove dressing on Monday and shower.  Apply mupirocin to stitches.  Refill of this was sent to pharmacy.  Return in 2 weeks to have sutures removed.  Continue surgical shoe.  Expect he will return to regular shoe gear and activity next visit    Return in about 2 weeks (around 12/05/2022) for post op (no x-rays), suture removal.

## 2022-11-26 ENCOUNTER — Other Ambulatory Visit: Payer: Self-pay | Admitting: Podiatry

## 2022-11-26 ENCOUNTER — Encounter: Payer: Self-pay | Admitting: Podiatry

## 2022-11-28 ENCOUNTER — Telehealth: Payer: Self-pay

## 2022-11-28 ENCOUNTER — Other Ambulatory Visit: Payer: Self-pay

## 2022-11-28 MED ORDER — DOXYCYCLINE HYCLATE 100 MG PO TABS: 100.0000 mg | ORAL_TABLET | Freq: Two times a day (BID) | ORAL | 0 refills | Status: AC

## 2022-11-28 NOTE — Telephone Encounter (Signed)
Patient called and left a message this morning. He noticed while changing his dressing that his foot is a little red - no more than usual. No heat from surgical site, no fever. Today he takes his last dose of doxycycline. Does he need to continue taking? He will need a refill. Please advise- Thanks

## 2022-12-05 ENCOUNTER — Encounter: Payer: Self-pay | Admitting: Podiatry

## 2022-12-05 ENCOUNTER — Ambulatory Visit (INDEPENDENT_AMBULATORY_CARE_PROVIDER_SITE_OTHER): Payer: Medicare PPO | Admitting: Podiatry

## 2022-12-05 VITALS — Ht 73.0 in | Wt 250.0 lb

## 2022-12-05 DIAGNOSIS — L97514 Non-pressure chronic ulcer of other part of right foot with necrosis of bone: Secondary | ICD-10-CM

## 2022-12-05 NOTE — Progress Notes (Signed)
  Subjective:  Patient ID: NANCY TINCHER, male    DOB: 1950/05/18,  MRN: [id number]  Chief Complaint  Patient presents with   Routine Post Op    POV #2 DOS 11/15/2022 RT 3RD TOE PARTIAL AMPUTATION Healing well ready for suture removal    DOS: 11/15/2022 Procedure: Right partial third toe amputation  72 y.o. male returns for post-op check.  He is doing well he notes no drainage  Review of Systems: Negative except as noted in the HPI. Denies N/V/F/Ch.   Objective:  There were no vitals filed for this visit. Body mass index is 32.98 kg/m. Constitutional Well developed. Well nourished.  Vascular Foot warm and well perfused. Capillary refill normal to all digits.  Calf is soft and supple, no posterior calf or knee pain, negative Homans' sign  Neurologic Normal speech. Oriented to person, place, and time. Epicritic sensation to light touch grossly absent bilaterally.  Dermatologic Skin healing well without signs of infection. Skin edges well coapted without signs of infection.  Orthopedic: No pain to palpation noted about the surgical site.   Assessment:   1. 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  S/p foot surgery right -Doing well.  Sutures removed uneventfully.  May return to regular shoe gear and activity as tolerated.  Advised to watch for any worsening or new areas that may be concerning for developing ulceration and he will notify me immediately if this develops.  He will follow-up with Dr. Stacie Acres as scheduled for his at risk diabetic footcare.    Return if symptoms worsen or fail to improve.

## 2022-12-23 ENCOUNTER — Encounter: Payer: Self-pay | Admitting: Neurology

## 2022-12-23 ENCOUNTER — Ambulatory Visit (INDEPENDENT_AMBULATORY_CARE_PROVIDER_SITE_OTHER): Payer: Medicare PPO | Admitting: Neurology

## 2022-12-23 VITALS — BP 116/69 | HR 70 | Ht 72.0 in | Wt 254.0 lb

## 2022-12-23 DIAGNOSIS — E1142 Type 2 diabetes mellitus with diabetic polyneuropathy: Secondary | ICD-10-CM

## 2022-12-23 DIAGNOSIS — M5415 Radiculopathy, thoracolumbar region: Secondary | ICD-10-CM | POA: Insufficient documentation

## 2022-12-23 DIAGNOSIS — Z7985 Long-term (current) use of injectable non-insulin antidiabetic drugs: Secondary | ICD-10-CM | POA: Diagnosis not present

## 2022-12-23 DIAGNOSIS — E1151 Type 2 diabetes mellitus with diabetic peripheral angiopathy without gangrene: Secondary | ICD-10-CM

## 2022-12-23 NOTE — Patient Instructions (Signed)
Needing NCV and EMG study for suspected thoracolumbar radiculopathy.

## 2022-12-23 NOTE — Progress Notes (Signed)
SLEEP MEDICINE CLINIC    Provider:  Melvyn Novas, MD  Primary Care Physician:  Raymond Fillers, MD [address] [place] [address]     Referring Provider: Garlan Fillers, Md [address] [place],  [place] [address]          Chief Complaint according to patient   Patient presents with:     New Consult for Sleep Patient (Initial Visit)           HISTORY OF PRESENT ILLNESS:  Raymond Herring is a 72 y.o. male patient who is seen upon referral on 12/23/2022 from Dr Raymond Herring for a sleep consult . I have seen him many years ago and he declined a sleep study, nocturia was main concern, prostate cancer.   Chief concern according to patient :  " I dont sleep very well but I dont want to address apnea now, I have shooting pains in my flank and radiating/ through hip and lateral thigh , shooting to the knee" PT at [facility] has seen him. He had a middle toe amputation for diabetic vasculopathy, charcot neuropathy , diabetics, necrosis.  Amputation was on the right foot, third toe , by podiatrist.    I have the pleasure of seeing Raymond Herring 12/23/22 a right -handed male with a possible sleep disorder.    Sleep Medicine Consult .  Chief concern according to patient :  I go to PCP with DM regularly, obesity, prostate cancer and related Nocturia- every 90-120 minutes at night. "   I have the pleasure of seeing Raymond Herring today, a left-handed White or Caucasian male with a nocturia sleep disorder.  He   has a past medical history of Anxiety, Arthritis, Diabetes mellitus, Diverticulosis, Headache, Hyperlipidemia, Hypertension, Neuropathy, Prostate cancer (HCC), Sarcoidosis of central nervous system, and Skin cancer. Had covid twice - first time 2020. Repeat infection in 2021- treated with monoclonal AB 12-2019.   has  charcot marie tooth, Back problems and neuropathy, feet pain.    Sleep relevant medical history: Nocturia 3-4 times, dental implants.  Family  medical /sleep history: No other family member on CPAP with OSA, insomnia, sleep walkers.  Social history:  Patient is retired from Orthoptist / Museum/gallery conservator, Medical illustrator, travelling 120 days a year-  and lives in a household with spouse, who snores.   with 4 adult children, 3 grandchildren.  Tobacco use: quit 40 years ago.  ETOH use - one drink a day,  Caffeine intake in form of Coffee( 2-3 a week) Soda( /) Tea ( /) or energy drinks.   Hobbies :hunting, fishing.        Sleep habits are as follows: The patient's dinner time is between 6-7 PM.  The patient goes to bed at 8-9 PM and asleep by 9.15-9.30 PM , he continues to sleep for intervals of 2 hours, wakes for several bathroom breaks, the first time at 12 AM.   Waking up after 2-3 AM makes it harder to go to sleep again, TV on all night in the bedroom.    The preferred sleep position is  laterally, right., with the support of 2 pillows.  Dreams are reportedly  infrequent/ not vivid.   6-7 AM is the usual rise time. The patient wakes up spontaneously.  He reports not feeling refreshed or restored in AM, with symptoms( rarely) dry mouth, morning headaches, and residual fatigue. Naps are taken rarely / infrequently, lasting from 1-2 hours  while on  hunting trips,  he takes no longer power naps.       Review of Systems: Out of a complete 14 system review, the patient complains of only the following symptoms, and all other reviewed systems are negative.:  Fatigue, sleepiness , snoring, fragmented sleep, with  Nocturia, watches Tv in bed.     How likely are you to doze in the following situations: 0 = not likely, 1 = slight chance, 2 = moderate chance, 3 = high chance   Sitting and Reading? Watching Television? Sitting inactive in a public place (theater or meeting)? As a passenger in a car for an hour without a break? Lying down in the afternoon when circumstances permit? Sitting and talking to someone? Sitting quietly after lunch  without alcohol? In a car, while stopped for a few minutes in traffic?   Total = NA/ 24 points   FSS endorsed at NA/ 63 points.   GDS 2/ 15   Social History   Socioeconomic History   Marital status: Married    Spouse name: Raymond Herring   Number of children: 4   Years of education: 12   Highest education level: Not on file  Occupational History   Occupation: retired  Tobacco Use   Smoking status: Former    Current packs/day: 0.00    Average packs/day: 2.0 packs/day for 25.0 years (50.0 ttl pk-yrs)    Types: Cigarettes    Start date: 01/25/1956    Quit date: 01/24/1981    Years since quitting: 41.9   Smokeless tobacco: Never  Vaping Use   Vaping status: Never Used  Substance and Sexual Activity   Alcohol use: Yes    Alcohol/week: 10.0 standard drinks of alcohol    Types: 3 Glasses of wine, 7 Cans of beer per week    Comment: maybe 1 a day or less   Drug use: No   Sexual activity: Not Currently  Other Topics Concern   Not on file  Social History Narrative   Lives with Raymond Herring (wife)   Caffeine use: 1 cup coffee or less per day   Left handed    Retired    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 Strain: Not on file  Food Insecurity: Not on file  Transportation Needs: Not on file  Physical Activity: Not on file  Stress: Not on file  Social Connections: Not on file    Family History  Problem Relation Age of Onset   Heart disease Father        Pacemaker   CVA Father    Alzheimer's disease Father    Neuropathy Father    Colon cancer Neg Hx    Breast cancer Neg Hx    Pancreatic cancer Neg Hx    Prostate cancer Neg Hx     Past Medical History:  Diagnosis Date   Anxiety    Arthritis    Diabetes mellitus    Diverticulosis    Headache    Hyperlipidemia    Hypertension    Neuropathy    feet and fingers   Prostate cancer (HCC)    Sarcoidosis of central nervous system    negatively affects gait   Skin cancer    Multiple areas removed. Managed by Dr.  Myrlene Herring.   Umbilical hernia     Past Surgical History:  Procedure Laterality Date   APPENDECTOMY     CATARACT EXTRACTION Bilateral    KNEE CARTILAGE SURGERY Bilateral 1308,6578   right and left knee  LAPAROSCOPIC APPENDECTOMY  2014   LYMPHADENECTOMY Bilateral 06/17/2018   Procedure: LYMPHADENECTOMY;  Surgeon: Sebastian Ache, MD;  Location: [facility];  Service: Urology;  Laterality: Bilateral;   REVERSE SHOULDER ARTHROPLASTY Left 02/02/2019   Procedure: REVERSE SHOULDER ARTHROPLASTY;  Surgeon: Yolonda Kida, MD;  Location: [facility] OR;  Service: Orthopedics;  Laterality: Left;  2.5hrs   ROBOT ASSISTED LAPAROSCOPIC RADICAL PROSTATECTOMY N/A 06/17/2018   Procedure: XI ROBOTIC ASSISTED LAPAROSCOPIC RADICAL PROSTATECTOMY;  Surgeon: Sebastian Ache, MD;  Location: [facility];  Service: Urology;  Laterality: N/A;  3 HRS   TOTAL KNEE ARTHROPLASTY  01/30/2011   Procedure: TOTAL KNEE ARTHROPLASTY;  Surgeon: Jacki Cones, MD;  Location: [facility];  Service: Orthopedics;  Laterality: Right;   TOTAL KNEE ARTHROPLASTY Left 01/31/2016   Procedure: LEFT TOTAL KNEE ARTHROPLASTY;  Surgeon: Ranee Gosselin, MD;  Location: [facility];  Service: Orthopedics;  Laterality: Left;   TRANSFORAMINAL LUMBAR INTERBODY FUSION (TLIF) WITH PEDICLE SCREW FIXATION 2 LEVEL N/A 01/17/2022   Procedure: L2-3 L3-4 Open Laminectomy/TLIF/posterolateral fusion;  Surgeon: Jadene Pierini, MD;  Location: [facility] OR;  Service: Neurosurgery;  Laterality: N/A;     Current Outpatient Medications on File Prior to Visit  Medication Sig Dispense Refill   acetaminophen (TYLENOL) 650 MG CR tablet Take 1,300 mg by mouth in the morning and at bedtime.     allopurinol (ZYLOPRIM) 300 MG tablet Take 300 mg by mouth daily.  12   amLODipine (NORVASC) 2.5 MG tablet Take 2.5 mg by mouth daily.     Ascorbic Acid (VITAMIN C PO) Take 3,000 mg by mouth daily.     celecoxib (CELEBREX) 200 MG capsule Take 200 mg by mouth 2 (two) times daily.      Cholecalciferol (DIALYVITE VITAMIN D 5000) 125 MCG (5000 UT) capsule Take 5,000 Units by mouth daily.     cilostazol (PLETAL) 100 MG tablet Take 100 mg by mouth daily.     clobetasol (OLUX) 0.05 % topical foam Apply 1 application topically daily as needed (irritation).      colchicine 0.6 MG tablet Take 0.6 mg by mouth daily as needed (gout).     Cyanocobalamin (B-12) 3000 MCG CAPS Take 3,000 mcg by mouth daily.     diclofenac Sodium (VOLTAREN) 1 % GEL Apply 1 Application topically daily.     fluorometholone (FML) 0.1 % ophthalmic suspension Place 1 drop into both eyes 2 (two) times daily.     gabapentin (NEURONTIN) 300 MG capsule Take 300 mg by mouth 2 (two) times daily.     Hypromellose 0.3 % SOLN Place 1-2 drops into both eyes in the morning and at bedtime. RETAINE HPMC 0.3%     Lancets (ONETOUCH DELICA PLUS LANCET33G) MISC SMARTSIG:Topical 2-4 Times Daily     Multiple Vitamin (MULTIVITAMIN WITH MINERALS) TABS tablet Take 1 tablet by mouth daily.     mupirocin ointment (BACTROBAN) 2 % Apply 1 Application topically daily. 30 g 2   olmesartan (BENICAR) 20 MG tablet Take 20 mg by mouth daily.     ONE TOUCH ULTRA TEST test strip      pravastatin (PRAVACHOL) 20 MG tablet Take 20 mg by mouth daily.     saccharomyces boulardii (FLORASTOR) 250 MG capsule Take 250 mg by mouth daily.     Semaglutide, 1 MG/DOSE, (OZEMPIC, 1 MG/DOSE,) 4 MG/3ML SOPN Inject 1 mg into the skin once a week.     Wheat Dextrin (BENEFIBER DRINK MIX PO) Take 1 Scoop by mouth daily.     zinc  gluconate 50 MG tablet Take 50 mg by mouth daily.     zolpidem (AMBIEN) 10 MG tablet Take 10 mg by mouth at bedtime as needed for sleep.      metFORMIN (GLUCOPHAGE) 500 MG tablet Take 500 mg by mouth 2 (two) times daily.      No current facility-administered medications on file prior to visit.    No Known Allergies   DIAGNOSTIC DATA (LABS, IMAGING, TESTING) - I reviewed patient records, labs, notes, testing and imaging myself where  available.  Lab Results  Component Value Date   WBC 6.1 01/03/2022   HGB 12.3 (L) 01/03/2022   HCT 37.3 (L) 01/03/2022   MCV 101.9 (H) 01/03/2022   PLT 187 01/03/2022      Component Value Date/Time   NA 137 01/03/2022 0845   K 5.3 (H) 01/03/2022 0845   CL 107 01/03/2022 0845   CO2 23 01/03/2022 0845   GLUCOSE 91 01/03/2022 0845   BUN 25 (H) 01/03/2022 0845   CREATININE 1.17 01/03/2022 0845   CALCIUM 10.3 01/03/2022 0845   PROT 7.3 01/26/2016 1340   ALBUMIN 4.7 01/26/2016 1340   AST 22 01/26/2016 1340   ALT 25 01/26/2016 1340   ALKPHOS 63 01/26/2016 1340   BILITOT 1.0 01/26/2016 1340   GFRNONAA >60 01/03/2022 0845   GFRAA >60 02/01/2019 0858   No results found for: "CHOL", "HDL", "LDLCALC", "LDLDIRECT", "TRIG", "CHOLHDL" Lab Results  Component Value Date   HGBA1C 5.9 (H) 01/03/2022   No results found for: "VITAMINB12" Lab Results  Component Value Date   TSH 1.37 01/05/2014    PHYSICAL EXAM:  Today's Vitals   12/23/22 1018  BP: 116/69  Pulse: 70  Weight: 254 lb (115.2 kg)  Height: 6' (1.829 m)   Body mass index is 34.45 kg/m.   Wt Readings from Last 3 Encounters:  12/23/22 254 lb (115.2 kg)  12/05/22 250 lb (113.4 kg)  01/17/22 250 lb (113.4 kg)     Ht Readings from Last 3 Encounters:  12/23/22 6' (1.829 m)  12/05/22 6\' 1"  (1.854 m)  01/17/22 6\' 1"  (1.854 m)      General: The patient is awake, alert and appears not in acute distress. The patient is well groomed. Head:  Normocephalic, atraumatic. Neck is supple. Mallampati 2-3,  neck circumference: 20 inches . Nasal airflow  patent.   Retrognathia is not seen. Facial hair.  Dental status: biological  Cardiovascular:  Regular rate and cardiac rhythm by pulse,  without distended neck veins. Respiratory: Lungs are clear.  Skin:  Without evidence of ankle edema, or rash. Trunk: The patient's posture is erect.   Neurologic exam : The patient is awake and alert, oriented to place and time.   Memory  subjective described as intact.  Attention span & concentration ability appears normal.  Speech is fluent,  without  dysarthria, dysphonia or aphasia.  Mood and affect are appropriate.   Cranial nerves: no loss of smell or taste reported  Pupils are equal and briskly reactive to light. Funduscopic exam deferred. .  Extraocular movements in vertical and horizontal planes were intact and without nystagmus. No Diplopia. Visual fields by finger perimetry are intact. Hearing was intact to soft voice and finger rubbing.    Facial sensation intact to fine touch.  Facial motor strength is symmetric and tongue and uvula move midline.  Neck ROM : rotation, tilt and flexion extension were normal for age and shoulder shrug was symmetrical.    Motor exam:  Symmetric bulk, tone and ROM.   Normal tone without cog wheeling, symmetric grip strength .   Sensory:  Fine touch, pinprick and vibration were abnormal, numbness extend though feet, ankles and up to patella. No vibration sensed at knee, ankles.  Reports a pain from the right thoracic nerve 10 down to the hip and back of the knee.     Coordination: Rapid alternating movements in the fingers/hands were of normal speed.  The Finger-to-nose maneuver was intact without evidence of ataxia, dysmetria or tremor.   Gait and station: Patient could rise unassisted from a seated position, walked without assistive device.  Stance is of normal width/ base and the patient turned with 3 steps.  Toe and heel walk were deferred.  Deep tendon reflexes: in the  upper and lower extremities are symmetrically attenuated and intact. Amputation of the right middle toe.   Bilateral knee replacement- 2013. Babinski response was deferred .      ASSESSMENT AND PLAN 72 y.o. year old male  here with a new problem, not wanting a sleep study but  NCV and EMG study, which I don't perform:   Suspected  radiculopathy at Th10-  NCV and EMG ordered at [facility] , I have 4  colleagues that can provide this test.   2) nocturia - frequent since prostate cancer Gleeson score 4, 38 radiations and prostatectomy.  No known bone mets.   3) patient has been seeing Dr. Maurice Small for spinal stenosis, was told that he had cervical and lumbal stenosis.   I plan to provide Raymond Herring PCP and Orthopedist  ( Dr Darrelyn Hillock) with the results of the NCV and EMG.     I would like to thank Raymond Fillers, MD and Raymond Fillers, Md [address] [place],  [place] [address] for allowing me to meet with and to take care of this pleasant patient.    After spending a total time of  35  minutes face to face and additional time for physical and neurologic examination, review of laboratory studies,  personal review of imaging studies, reports and results of other testing and review of referral information / records as far as provided in visit,   Electronically signed by: Raymond Novas, MD 12/23/2022 10:53 AM  [facility] Board certified by The [employer] of Sleep Medicine and Diplomate of the [employer] of Sleep Medicine. Board certified In Neurology through the ABPN, Fellow of the [employer] of Neurology.

## 2023-01-10 ENCOUNTER — Encounter: Payer: Self-pay | Admitting: Podiatry

## 2023-01-10 ENCOUNTER — Ambulatory Visit (INDEPENDENT_AMBULATORY_CARE_PROVIDER_SITE_OTHER): Payer: Medicare PPO | Admitting: Podiatry

## 2023-01-10 DIAGNOSIS — M79674 Pain in right toe(s): Secondary | ICD-10-CM | POA: Diagnosis not present

## 2023-01-10 DIAGNOSIS — E1140 Type 2 diabetes mellitus with diabetic neuropathy, unspecified: Secondary | ICD-10-CM

## 2023-01-10 DIAGNOSIS — B351 Tinea unguium: Secondary | ICD-10-CM

## 2023-01-10 DIAGNOSIS — M79675 Pain in left toe(s): Secondary | ICD-10-CM

## 2023-01-10 DIAGNOSIS — L089 Local infection of the skin and subcutaneous tissue, unspecified: Secondary | ICD-10-CM

## 2023-01-10 DIAGNOSIS — E11628 Type 2 diabetes mellitus with other skin complications: Secondary | ICD-10-CM

## 2023-01-10 NOTE — Progress Notes (Signed)
This patient returns to my office for  an evaluation of his third toe right foot.  This patient requires this care by a professional since this patient will be at risk due to having diabetes with neuropathy.Marland Kitchen    He presents to the office for evaluation and treatment.    General Appearance  Alert, conversant and in no acute stress.  Vascular  Dorsalis pedis and posterior tibial  pulses are weaky  palpable  bilaterally.  Capillary return is within normal limits  bilaterally. Temperature is within normal limits  bilaterally.  Neurologic  Senn-Weinstein monofilament wire test within normal limits  bilaterally. Muscle power within normal limits bilaterally.  Nails Thick disfigured discolored nails with subungual debris  from hallux to fifth toes bilaterally. No evidence of bacterial infection or drainage bilaterally.    Orthopedic  No limitations of motion  feet .  No crepitus or effusions noted.  No bony pathology or digital deformities noted.  Asymptomatic  HAV.  Limited ROM STJ right foot. 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  Skin  normotropic skin with no porokeratosis noted bilaterally.  No signs of infections or ulcers noted.     Onychomycosis  Debride nails with nail nipper and dremel tool.   Return office visit    3 months               Told patient to return for periodic foot care and evaluation due to potential at risk complications.   Helane Gunther DPM

## 2023-01-17 DIAGNOSIS — M25552 Pain in left hip: Secondary | ICD-10-CM | POA: Diagnosis not present

## 2023-01-17 DIAGNOSIS — M25551 Pain in right hip: Secondary | ICD-10-CM | POA: Diagnosis not present

## 2023-01-30 DIAGNOSIS — M25551 Pain in right hip: Secondary | ICD-10-CM | POA: Diagnosis not present

## 2023-01-30 DIAGNOSIS — M25552 Pain in left hip: Secondary | ICD-10-CM | POA: Diagnosis not present

## 2023-02-04 ENCOUNTER — Ambulatory Visit: Payer: Medicare PPO | Admitting: Neurology

## 2023-02-04 DIAGNOSIS — M25551 Pain in right hip: Secondary | ICD-10-CM | POA: Diagnosis not present

## 2023-02-04 DIAGNOSIS — M25552 Pain in left hip: Secondary | ICD-10-CM | POA: Diagnosis not present

## 2023-02-06 DIAGNOSIS — M25551 Pain in right hip: Secondary | ICD-10-CM | POA: Diagnosis not present

## 2023-02-06 DIAGNOSIS — M25552 Pain in left hip: Secondary | ICD-10-CM | POA: Diagnosis not present

## 2023-02-10 DIAGNOSIS — M25552 Pain in left hip: Secondary | ICD-10-CM | POA: Diagnosis not present

## 2023-02-10 DIAGNOSIS — M25551 Pain in right hip: Secondary | ICD-10-CM | POA: Diagnosis not present

## 2023-02-11 ENCOUNTER — Ambulatory Visit
Admission: RE | Admit: 2023-02-11 | Discharge: 2023-02-11 | Disposition: A | Discharge status: Home / Self Care | Payer: Medicare PPO | Source: Ambulatory Visit | Attending: Neurology | Admitting: Neurology

## 2023-02-11 ENCOUNTER — Other Ambulatory Visit: Payer: Self-pay | Admitting: Neurology

## 2023-02-11 ENCOUNTER — Ambulatory Visit (INDEPENDENT_AMBULATORY_CARE_PROVIDER_SITE_OTHER): Payer: Self-pay | Admitting: Neurology

## 2023-02-11 ENCOUNTER — Ambulatory Visit (INDEPENDENT_AMBULATORY_CARE_PROVIDER_SITE_OTHER): Payer: Medicare PPO | Admitting: Neurology

## 2023-02-11 DIAGNOSIS — G629 Polyneuropathy, unspecified: Secondary | ICD-10-CM

## 2023-02-11 DIAGNOSIS — Z0289 Encounter for other administrative examinations: Secondary | ICD-10-CM

## 2023-02-11 DIAGNOSIS — M546 Pain in thoracic spine: Secondary | ICD-10-CM | POA: Diagnosis not present

## 2023-02-11 DIAGNOSIS — M5415 Radiculopathy, thoracolumbar region: Secondary | ICD-10-CM

## 2023-02-11 NOTE — Progress Notes (Signed)
Full Name: Raymond Herring Gender: Male MRN #: [id number] Date of Birth: 1950-02-24    Visit Date: 02/11/2023 08:14 Age: 73 Years Examining Physician: Dr. Naomie Dean Referring Physician: Dr. Porfirio Mylar Dohmeier Height: 6 feet 0 inch  History: Reports a pain from the right thoracic area mid axi;;ary line down to the hip and back of the knee. Dr. Maurice Small perform surgery in January 2024: per notes 01/18/2022: uncomplicated L2-3/3-4 decompression and PSIF, laminectomy, l2-3 l3-l4 open lamiectomy/TLIF/posterolateral. He has felt significantly better after the surgery. Currently back pain is better, has shooting pain on the right starting mid thoracic/mid axillary line and radiates to the outer right thigh down to the knee. He has a "knot" where he points to midline thoracic level on the right ongoing for years, point tenderness below the nipple line laterally to the right where the latissimus dorsi is but no weakness in this muscles and no winged scapula with  rhomboids strong on limited exam today: No winged scapula, good external/internal rotation and arm strength. Limited exam showed did show: T4-T5 point tenderness pain at mid axillary line, appears under serratus anterior muscle pain on point palpation on rib at t4-t5.  Summary:NCS was performed on the bilateral lower extremities. EMG was performed on the right upper, right lower and thoracic muscles and paraspinals. Nerve conduction studies showed reduced amplitude of the right peroneal motor nerve (0.6 mV, normal greater than 2) and decreased conduction velocity (fib head to ankle, 34 m/s, normal greater than 44).  The left peroneal motor nerve showed reduced amplitude (0.7 mV, normal greater than 2) and decreased conduction velocity (fib head to ankle, 32 m/s, normal greater than 44) and decreased conduction velocity (pop fossa to fib head, 42 m/s, normal greater than 44.  The right tibial motor nerve showed no response.  The left tibial motor  nerve showed delayed distal onset latency (8.3 ms, normal less than 5.8), reduced amplitude (0.3 mV, normal greater than 4) and decreased conduction velocity (38 m/s, normal greater than 41.  In the right sural, left sural, right superficial peroneal and left superficial peroneal sensory conductions all showed no response.  The left tibial F wave showed no response.  Bilateral H reflexes showed no response.  EMG needle study showed chronic neurogenic changes in proximal lower extremity muscles including increased motor unit amplitude, prolonged motor unit duration, polyphasic motor units and diminished motor unit recruitment in the right iliopsoas, right vastus medialis, right adductor longus which corresponds to prior radiculopathy status post surgery.All and muscles (as indicated in the following tables) were within normal limits.    Conclusion, no electrophysiologic evidence to explain patient's right mid axillary pain around T4-T5, in particular additional muscles were performed on EMG needle study including multiple levels of the thoracic paraspinals, supraspinatus, infraspinatus, rhomboids, serratus anterior, latissimus dorsi which were all within normal limits; chronic neurogenic changes were seen in proximal L3-L4 muscles without acute ongoing denervation which correlates with prior, remote L3-L4 remote radiculopathy status post surgery without evidence of acute denervation.  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  Please see additional note attached to this encounter.  Thoracic xray was ordered per patient request: No explanation for patient's point tenderness at the approximate T4 and T5 vertebral bodies. If clinical concern persists, further. evaluation with MRI could be performed as indicated.    ------------------------------- Naomie Dean, M.D.  [facility] [address] [place], [place] [address] Tel: [phone] Fax: [phone]  Verbal informed consent was obtained from the patient, patient was informed of potential risk of procedure, including bruising, bleeding, hematoma formation, infection, muscle weakness, muscle pain, numbness, among others.        MNC    Nerve / Sites Muscle Latency Ref. Amplitude Ref. Rel Amp Segments Distance Velocity Ref. Area    ms ms mV mV %  cm m/s m/s mVms  R Peroneal - EDB     Ankle EDB 5.8 <=6.5 0.6 >=2.0 100 Ankle - EDB 9   1.3     Fib head EDB 15.6  0.4  64.9 Fib head - Ankle 33 34 >=44 1.0     Pop fossa EDB 17.8  0.5  112 Pop fossa - Fib head 10 44 >=44 1.1         Pop fossa - Ankle      L Peroneal - EDB     Ankle EDB 7.1 <=6.5 0.7 >=2.0 100 Ankle - EDB 9   2.6     Fib head EDB 16.9  0.9  129 Fib head - Ankle 31 32 >=44 1.8     Pop fossa EDB 19.5  1.0  119 Pop fossa - Fib head 11 42 >=44 3.3         Pop fossa - Ankle      R Tibial - AH     Ankle AH NR <=5.8 NR >=4.0 NR Ankle - AH 9   NR  L Tibial - AH     Ankle AH 8.3 <=5.8 0.3 >=4.0 100 Ankle - AH 9   0.7     Pop fossa AH 19.5  0.4  125 Pop fossa - Ankle 43 38 >=41 2.0             SNC    Nerve / Sites Rec. Site Peak Lat Ref.  Amp Ref. Segments Distance    ms ms V V  cm  R Sural - Ankle (Calf)     Calf Ankle NR <=4.4 NR >=6 Calf - Ankle 14  L Sural - Ankle (Calf)     Calf Ankle NR <=4.4 NR >=6 Calf - Ankle 14  R Superficial peroneal - Ankle     Lat leg Ankle NR <=4.4 NR >=6 Lat leg - Ankle 14  L Superficial peroneal - Ankle     Lat leg Ankle NR <=4.4 NR >=6 Lat leg - Ankle 14             F  Wave    Nerve F Lat Ref.   ms ms  L Tibial - AH NR <=56.0       H Reflex    Nerve H Lat Lat Hmax   ms ms   Left Right Ref. Left Right Ref.  Tibial - Soleus NR NR <=35.0 NR NR <=35.0         EMG Summary Table    Spontaneous MUAP Recruitment  Muscle IA Fib PSW Fasc Other Amp Dur. Poly Pattern  R. Thoracic paraspinals  (low) Normal None None None _______ Normal Normal Normal Normal  R. Thoracic paraspinals (mid) Normal None None None _______ Normal Normal Normal Normal  R. Thoracic paraspinals (up) Normal None None None _______ Normal Normal Normal Normal  R. Supraspinatus Normal None None None _______ Normal Normal Normal Normal  R. Rhomboid major Normal None None None _______ Normal Normal Normal Normal  R. Biceps brachii latissimus dorsi  t6-t11  thora None None None _______ Normal Normal Normal Normal  R. Infraspinatus Normal None None None _______ Normal Normal Normal Normal  R. Iliopsoas Normal None None None _______ Increased Increased 4+ Reduced  R. Vastus medialis Normal None None None _______ Increased Increased 2+ Reduced  R. Tibialis anterior Normal None None None _______ Normal Normal Normal Normal  R. Tibialis posterior Normal None None None _______ Normal Normal Normal Normal  R. Gastrocnemius (Medial head) Normal None None None _______ Normal Normal Normal Normal  R. Adductor longus Normal None None None _______ Normal Increased 4+ Reduced  R. Biceps femoris (long head) Normal None None None _______ Normal Normal Normal Normal  R. Gluteus maximus Normal None None None _______ Normal Normal Normal Normal  R. Gluteus medius Normal None None None _______ Normal Normal Normal Normal

## 2023-02-11 NOTE — Patient Instructions (Signed)
Endorse pain

## 2023-02-12 NOTE — Progress Notes (Signed)
See procedure note.

## 2023-02-14 DIAGNOSIS — M7061 Trochanteric bursitis, right hip: Secondary | ICD-10-CM | POA: Diagnosis not present

## 2023-02-14 DIAGNOSIS — M160 Bilateral primary osteoarthritis of hip: Secondary | ICD-10-CM | POA: Diagnosis not present

## 2023-02-14 DIAGNOSIS — M7062 Trochanteric bursitis, left hip: Secondary | ICD-10-CM | POA: Diagnosis not present

## 2023-02-14 DIAGNOSIS — M25551 Pain in right hip: Secondary | ICD-10-CM | POA: Diagnosis not present

## 2023-02-14 DIAGNOSIS — M25552 Pain in left hip: Secondary | ICD-10-CM | POA: Diagnosis not present

## 2023-02-16 NOTE — Procedures (Signed)
Full Name: Raymond Herring Gender: Male MRN #: [id number] Date of Birth: 07/02/1950    Visit Date: 02/11/2023 08:14 Age: 73 Years Examining Physician: Dr. Naomie Dean Referring Physician: Dr. Porfirio Mylar Dohmeier Height: 6 feet 0 inch  History: Reports a pain from the right thoracic area mid axillary line down to the hip and back of the knee. Dr. Maurice Small perform surgery in January 2024: per notes 01/18/2022: uncomplicated L2-3/3-4 decompression and PSIF, laminectomy, l2-3 l3-l4 open lamiectomy/TLIF/posterolateral. He has felt significantly better after the surgery. Currently back pain is better, has shooting pain on the right starting mid thoracic/mid axillary line and radiates to the outer right thigh down to the knee. He has a "knot" where he points to midline thoracic level on the right ongoing for years, point tenderness below the nipple line laterally to the right where the latissimus dorsi is but no weakness in this muscles and no winged scapula with  rhomboids strong on limited exam today: No winged scapula, good external/internal rotation and arm strength. Limited exam showed did show: T4-T5 point tenderness pain at mid axillary line, appears under serratus anterior muscle pain on point palpation on rib at t4-t5.  Summary:NCS was performed on the bilateral lower extremities. EMG was performed on the right upper, right lower and thoracic muscles and paraspinals. Nerve conduction studies showed reduced amplitude of the right peroneal motor nerve (0.6 mV, normal greater than 2) and decreased conduction velocity (fib head to ankle, 34 m/s, normal greater than 44).  The left peroneal motor nerve showed reduced amplitude (0.7 mV, normal greater than 2) and decreased conduction velocity (fib head to ankle, 32 m/s, normal greater than 44) and decreased conduction velocity (pop fossa to fib head, 42 m/s, normal greater than 44.  The right tibial motor nerve showed no response.  The left tibial motor  nerve showed delayed distal onset latency (8.3 ms, normal less than 5.8), reduced amplitude (0.3 mV, normal greater than 4) and decreased conduction velocity (38 m/s, normal greater than 41.  In the right sural, left sural, right superficial peroneal and left superficial peroneal sensory conductions all showed no response.  The left tibial F wave showed no response.  Bilateral H reflexes showed no response.  EMG needle study showed chronic neurogenic changes in proximal lower extremity muscles including increased motor unit amplitude, prolonged motor unit duration, polyphasic motor units and diminished motor unit recruitment in the right iliopsoas, right vastus medialis, right adductor longus which corresponds to prior radiculopathy status post surgery.All and muscles (as indicated in the following tables) were within normal limits.    Conclusion, no electrophysiologic evidence to explain patient's right mid axillary pain around T4-T5, in particular additional muscles were performed on EMG needle study including multiple levels of the thoracic paraspinals, supraspinatus, infraspinatus, rhomboids, serratus anterior, latissimus dorsi which were all within normal limits; chronic neurogenic changes were seen in proximal L3-L4 muscles without acute ongoing denervation which correlates with prior, remote L3-L4 remote radiculopathy status post surgery without evidence of acute denervation.  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  Please see additional note attached to this encounter.  Thoracic xray was ordered per patient request: No explanation for patient's point tenderness at the approximate T4 and T5 vertebral bodies. If clinical concern persists, further. evaluation with MRI could be performed as indicated.    ------------------------------- Naomie Dean, M.D.  [facility] [address]  [address], [address] [place], [place] [address] Tel: [phone] Fax: [phone]  Verbal informed consent was obtained from the patient, patient was informed of potential risk of procedure, including bruising, bleeding, hematoma formation, infection, muscle weakness, muscle pain, numbness, among others.        MNC    Nerve / Sites Muscle Latency Ref. Amplitude Ref. Rel Amp Segments Distance Velocity Ref. Area    ms ms mV mV %  cm m/s m/s mVms  R Peroneal - EDB     Ankle EDB 5.8 <=6.5 0.6 >=2.0 100 Ankle - EDB 9   1.3     Fib head EDB 15.6  0.4  64.9 Fib head - Ankle 33 34 >=44 1.0     Pop fossa EDB 17.8  0.5  112 Pop fossa - Fib head 10 44 >=44 1.1         Pop fossa - Ankle      L Peroneal - EDB     Ankle EDB 7.1 <=6.5 0.7 >=2.0 100 Ankle - EDB 9   2.6     Fib head EDB 16.9  0.9  129 Fib head - Ankle 31 32 >=44 1.8     Pop fossa EDB 19.5  1.0  119 Pop fossa - Fib head 11 42 >=44 3.3         Pop fossa - Ankle      R Tibial - AH     Ankle AH NR <=5.8 NR >=4.0 NR Ankle - AH 9   NR  L Tibial - AH     Ankle AH 8.3 <=5.8 0.3 >=4.0 100 Ankle - AH 9   0.7     Pop fossa AH 19.5  0.4  125 Pop fossa - Ankle 43 38 >=41 2.0             SNC    Nerve / Sites Rec. Site Peak Lat Ref.  Amp Ref. Segments Distance    ms ms V V  cm  R Sural - Ankle (Calf)     Calf Ankle NR <=4.4 NR >=6 Calf - Ankle 14  L Sural - Ankle (Calf)     Calf Ankle NR <=4.4 NR >=6 Calf - Ankle 14  R Superficial peroneal - Ankle     Lat leg Ankle NR <=4.4 NR >=6 Lat leg - Ankle 14  L Superficial peroneal - Ankle     Lat leg Ankle NR <=4.4 NR >=6 Lat leg - Ankle 14             F  Wave    Nerve F Lat Ref.   ms ms  L Tibial - AH NR <=56.0       H Reflex    Nerve H Lat Lat Hmax   ms ms   Left Right Ref. Left Right Ref.  Tibial - Soleus NR NR <=35.0 NR NR <=35.0         EMG Summary Table    Spontaneous MUAP Recruitment  Muscle IA Fib PSW Fasc Other Amp Dur. Poly Pattern  R. Thoracic paraspinals  (low) Normal None None None _______ Normal Normal Normal Normal  R. Thoracic paraspinals (mid) Normal None None None _______ Normal Normal Normal Normal  R. Thoracic paraspinals (up) Normal None None None _______ Normal Normal Normal Normal  R. Supraspinatus Normal None None None _______ Normal Normal Normal Normal  R. Rhomboid major Normal None None None _______ Normal Normal Normal Normal  R. Biceps brachii latissimus dorsi  t6-t11  thora None None None _______ Normal Normal Normal Normal  R. Infraspinatus Normal None None None _______ Normal Normal Normal Normal  R. Iliopsoas Normal None None None _______ Increased Increased 4+ Reduced  R. Vastus medialis Normal None None None _______ Increased Increased 2+ Reduced  R. Tibialis anterior Normal None None None _______ Normal Normal Normal Normal  R. Tibialis posterior Normal None None None _______ Normal Normal Normal Normal  R. Gastrocnemius (Medial head) Normal None None None _______ Normal Normal Normal Normal  R. Adductor longus Normal None None None _______ Increased Increased 4+ Reduced  R. Biceps femoris (long head) Normal None None None _______ Normal Normal Normal Normal  R. Gluteus maximus Normal None None None _______ Normal Normal Normal Normal  R. Gluteus medius Normal None None None _______ Normal Normal Normal Normal

## 2023-02-16 NOTE — Progress Notes (Signed)
I reviewed results from today's EMG nerve conduction study.  Patient endorses pain in the following locations.  As per EMG, multiple muscles were sampled included thoracic paraspinals, serratus anterior, latissimus dorsi, infraspinatus and supraspinatus and no etiology find.  Results of x-ray as below which was ordered after discussion with patient.  Narrative & Impression  CLINICAL DATA:  Point tenderness at the T4 and T5 vertebral bodies for the past 1-1/2 years. No known injury.   EXAM: THORACIC SPINE - 3 VIEWS   COMPARISON:  None Available.   FINDINGS: Evaluation of the superior aspect of the thoracic spine as well as the cervicothoracic junction is degraded secondary to overlying osseous and soft tissue structures and also secondary to the patient's total shoulder prosthesis.   No significant scoliotic curvature of the thoracic spine. No definite anterolisthesis or retrolisthesis.   Thoracic vertebral bodies heights appear preserved with special attention paid to the approximate T4 and T5 vertebral bodies.   Thoracic intervertebral disc space heights appear preserved. Stigmata of dish within the thoracic spine.   Limited visualization of the adjacent thorax demonstrates atherosclerotic plaque within the aortic arch. Regional soft tissues appear normal.   IMPRESSION: 1. No explanation for patient's point tenderness at the approximate T4 and T5 vertebral bodies. If clinical concern persists, further evaluation with MRI could be performed as indicated. 2. Stigmata of DISH within the thoracic spine. 3. Aortic Atherosclerosis (ICD10-I70.0).   Endorse pain    I spent over 20 minutes of face-to-face and non-face-to-face time with patient on the  1. Radiculopathy of thoracolumbar region    diagnosis.  This included previsit chart review, lab review, study review, order entry, electronic health record documentation, patient education on the different diagnostic and therapeutic  options, counseling and coordination of care, risks and benefits of management, compliance, or risk factor reduction. This does not include time spent on emg/ncs

## 2023-02-17 ENCOUNTER — Encounter: Payer: Self-pay | Admitting: Neurology

## 2023-02-18 DIAGNOSIS — M25551 Pain in right hip: Secondary | ICD-10-CM | POA: Diagnosis not present

## 2023-02-18 DIAGNOSIS — M25552 Pain in left hip: Secondary | ICD-10-CM | POA: Diagnosis not present

## 2023-02-20 NOTE — Telephone Encounter (Signed)
 LVM for pt to call back to discuss a new concern .

## 2023-02-21 DIAGNOSIS — M25552 Pain in left hip: Secondary | ICD-10-CM | POA: Diagnosis not present

## 2023-02-21 DIAGNOSIS — M25551 Pain in right hip: Secondary | ICD-10-CM | POA: Diagnosis not present

## 2023-02-27 DIAGNOSIS — M25552 Pain in left hip: Secondary | ICD-10-CM | POA: Diagnosis not present

## 2023-02-27 DIAGNOSIS — M25551 Pain in right hip: Secondary | ICD-10-CM | POA: Diagnosis not present

## 2023-03-05 DIAGNOSIS — L570 Actinic keratosis: Secondary | ICD-10-CM | POA: Diagnosis not present

## 2023-03-05 DIAGNOSIS — L218 Other seborrheic dermatitis: Secondary | ICD-10-CM | POA: Diagnosis not present

## 2023-03-05 DIAGNOSIS — Z85828 Personal history of other malignant neoplasm of skin: Secondary | ICD-10-CM | POA: Diagnosis not present

## 2023-03-05 DIAGNOSIS — L720 Epidermal cyst: Secondary | ICD-10-CM | POA: Diagnosis not present

## 2023-03-25 DIAGNOSIS — L720 Epidermal cyst: Secondary | ICD-10-CM | POA: Diagnosis not present

## 2023-03-25 DIAGNOSIS — Z85828 Personal history of other malignant neoplasm of skin: Secondary | ICD-10-CM | POA: Diagnosis not present

## 2023-03-27 ENCOUNTER — Ambulatory Visit: Payer: Medicare PPO | Admitting: Neurology

## 2023-03-27 ENCOUNTER — Encounter: Payer: Self-pay | Admitting: Neurology

## 2023-03-27 VITALS — BP 120/75 | HR 74 | Ht 72.0 in | Wt 269.6 lb

## 2023-03-27 DIAGNOSIS — Z7985 Long-term (current) use of injectable non-insulin antidiabetic drugs: Secondary | ICD-10-CM | POA: Diagnosis not present

## 2023-03-27 DIAGNOSIS — M5416 Radiculopathy, lumbar region: Secondary | ICD-10-CM

## 2023-03-27 DIAGNOSIS — E1142 Type 2 diabetes mellitus with diabetic polyneuropathy: Secondary | ICD-10-CM | POA: Diagnosis not present

## 2023-03-27 MED ORDER — ALPHA-LIPOIC ACID 100 MG PO CAPS: ORAL_CAPSULE | ORAL | 5 refills | Status: AC

## 2023-03-27 NOTE — Patient Instructions (Signed)
 Peripheral Neuropathy Peripheral neuropathy is a type of nerve damage. It affects nerves that carry signals between the spinal cord and the arms, legs, and the rest of the body (peripheral nerves). It does not affect nerves in the spinal cord or brain. In peripheral neuropathy, one nerve or a group of nerves may be damaged. Peripheral neuropathy is a broad category that includes many specific nerve disorders, like diabetic neuropathy, hereditary neuropathy, and carpal tunnel syndrome. What are the causes? This condition may be caused by: Certain diseases, such as: Diabetes. This is the most common cause of peripheral neuropathy. Autoimmune diseases, such as rheumatoid arthritis and systemic lupus erythematosus. Nerve diseases that are passed from parent to child (inherited). Kidney disease. Thyroid disease. Other causes may include: Nerve injury. Pressure or stress on a nerve that lasts a long time. Lack (deficiency) of B vitamins. This can result from alcoholism, poor diet, or a restricted diet. Infections. Some medicines, such as cancer medicines (chemotherapy). Poisonous (toxic) substances, such as lead and mercury. Too little blood flowing to the legs. In some cases, the cause of this condition is not known. What are the signs or symptoms? Symptoms of this condition depend on which of your nerves is damaged. Symptoms in the legs, hands, and arms can include: Loss of feeling (numbness) in the feet, hands, or both. Tingling in the feet, hands, or both. Burning pain. Very sensitive skin. Weakness. Not being able to move a part of the body (paralysis). Clumsiness or poor coordination. Muscle twitching. Loss of balance. Symptoms in other parts of the body can include: Not being able to control your bladder. Feeling dizzy. Sexual problems. How is this diagnosed? Diagnosing and finding the cause of peripheral neuropathy can be difficult. Your health care provider will take your  medical history and do a physical exam. A neurological exam will also be done. This involves checking things that are affected by your brain, spinal cord, and nerves (nervous system). For example, your health care provider will check your reflexes, how you move, and what you can feel. You may have other tests, such as: Blood tests. Electromyogram (EMG) and nerve conduction tests. These tests check nerve function and how well the nerves are controlling the muscles. Imaging tests, such as a CT scan or MRI, to rule out other causes of your symptoms. Removing a small piece of nerve to be examined in a lab (nerve biopsy). Removing and examining a small amount of the fluid that surrounds the brain and spinal cord (lumbar puncture). How is this treated? Treatment for this condition may involve: Treating the underlying cause of the neuropathy, such as diabetes, kidney disease, or vitamin deficiencies. Stopping medicines that can cause neuropathy, such as chemotherapy. Medicine to help relieve pain. Medicines may include: Prescription or over-the-counter pain medicine. Anti-seizure medicine. Antidepressants. Pain-relieving patches that are applied to painful areas of skin. Surgery to relieve pressure on a nerve or to destroy a nerve that is causing pain. Physical therapy to help improve movement and balance. Devices to help you move around (assistive devices). Follow these instructions at home: Medicines Take over-the-counter and prescription medicines only as told by your health care provider. Do not take any other medicines without first asking your health care provider. Ask your health care provider if the medicine prescribed to you requires you to avoid driving or using machinery. Lifestyle  Do not use any products that contain nicotine or tobacco. These products include cigarettes, chewing tobacco, and vaping devices, such as e-cigarettes. Smoking keeps  blood from reaching damaged nerves. If you  need help quitting, ask your health care provider. Avoid or limit alcohol. Too much alcohol can cause a vitamin B deficiency, and vitamin B is needed for healthy nerves. Eat a healthy diet. This includes: Eating foods that are high in fiber, such as beans, whole grains, and fresh fruits and vegetables. Limiting foods that are high in fat and processed sugars, such as fried or sweet foods. General instructions  If you have diabetes, work closely with your health care provider to keep your blood sugar under control. If you have numbness in your feet: Check every day for signs of injury or infection. Watch for redness, warmth, and swelling. Wear padded socks and comfortable shoes. These help protect your feet. Develop a good support system. Living with peripheral neuropathy can be stressful. Consider talking with a mental health specialist or joining a support group. Use assistive devices and attend physical therapy as told by your health care provider. This may include using a walker or a cane. Keep all follow-up visits. This is important. Where to find more information [employer] of Neurological Disorders: [employer] Contact a health care provider if: You have new signs or symptoms of peripheral neuropathy. You are struggling emotionally from dealing with peripheral neuropathy. Your pain is not well controlled. Get help right away if: You have an injury or infection that is not healing normally. You develop new weakness in an arm or leg. You have fallen or do so frequently. Summary Peripheral neuropathy is when the nerves in the arms or legs are damaged, resulting in numbness, weakness, or pain. There are many causes of peripheral neuropathy, including diabetes, pinched nerves, vitamin deficiencies, autoimmune disease, and hereditary conditions. Diagnosing and finding the cause of peripheral neuropathy can be difficult. Your health care provider will take your medical  history, do a physical exam, and do tests, including blood tests and nerve function tests. Treatment involves treating the underlying cause of the neuropathy and taking medicines to help control pain. Physical therapy and assistive devices may also help. This information is not intended to replace advice given to you by your health care provider. Make sure you discuss any questions you have with your health care provider. Document Revised: 09/05/2020 Document Reviewed: 09/05/2020 Elsevier Patient Education  2024 [employer].

## 2023-03-27 NOTE — Progress Notes (Signed)
 Provider:  Melvyn Novas, MD  Primary Care Physician:  Garlan Fillers, MD [address] [place] [address]     Referring Provider: Garlan Fillers, Md [address] [place],  [place] [address]          Chief Complaint according to patient   Patient presents with:                HISTORY OF PRESENT ILLNESS:  Raymond Herring is a 73 y.o. male patient who is here for revisit 03/27/2023 for  discussion of NCV/ EMG and Spinal images. .  Chief concern according to patient :  Still having that knot on the right thorax, just at this midaxillary- point still having pain. We found no neurologic, radiculopathic  origin for this trigger point. Non radiating pain.  EMG / NCV  The back was another story, severe neuropathy, sensory-  and nothing acute.  This test showed no intervention possibility from a neurologic standpoint.   I recommend PT and massage therapy, and some supplement orally to protect nerves from further damage.  Alpha lipoic fatty acids .      Review of Systems: Out of a complete 14 system review, the patient complains of only the following symptoms, and all other reviewed systems are negative.:   Unchanged     Social History   Socioeconomic History   Marital status: Married    Spouse name: Adele   Number of children: 4   Years of education: 12   Highest education level: Not on file  Occupational History   Occupation: retired  Tobacco Use   Smoking status: Former    Current packs/day: 0.00    Average packs/day: 2.0 packs/day for 25.0 years (50.0 ttl pk-yrs)    Types: Cigarettes    Start date: 01/25/1956    Quit date: 01/24/1981    Years since quitting: 42.1   Smokeless tobacco: Never  Vaping Use   Vaping status: Never Used  Substance and Sexual Activity   Alcohol use: Yes    Alcohol/week: 10.0 standard drinks of alcohol    Types: 3 Glasses of wine, 7 Cans of beer per week    Comment: maybe 1 a day or less   Drug use: No    Sexual activity: Not Currently  Other Topics Concern   Not on file  Social History Narrative   Lives with [name] (wife)   Caffeine use: 1 cup coffee or less per day   Left handed    Retired    [employer]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: Not on Ship broker Insecurity: Not on file  Transportation Needs: Not on file  Physical Activity: Not on file  Stress: Not on file  Social Connections: Not on file    Family History  Problem Relation Age of Onset   Heart disease Father        Pacemaker   CVA Father    Alzheimer's disease Father    Neuropathy Father    Colon cancer Neg Hx    Breast cancer Neg Hx    Pancreatic cancer Neg Hx    Prostate cancer Neg Hx     Past Medical History:  Diagnosis Date   Anxiety    Arthritis    Diabetes mellitus    Diverticulosis    Headache    Hyperlipidemia    Hypertension    Neuropathy    feet and fingers   Prostate  cancer (HCC)    Sarcoidosis of central nervous system    negatively affects gait   Skin cancer    Multiple areas removed. Managed by Dr. Myrlene Broker.   Umbilical hernia     Past Surgical History:  Procedure Laterality Date   APPENDECTOMY     CATARACT EXTRACTION Bilateral    KNEE CARTILAGE SURGERY Bilateral 4098,[id number]   right and left knee   LAPAROSCOPIC APPENDECTOMY  2014   LYMPHADENECTOMY Bilateral 06/17/2018   Procedure: LYMPHADENECTOMY;  Surgeon: Sebastian Ache, MD;  Location: [facility];  Service: Urology;  Laterality: Bilateral;   REVERSE SHOULDER ARTHROPLASTY Left 02/02/2019   Procedure: REVERSE SHOULDER ARTHROPLASTY;  Surgeon: Yolonda Kida, MD;  Location: [facility];  Service: Orthopedics;  Laterality: Left;  2.5hrs   ROBOT ASSISTED LAPAROSCOPIC RADICAL PROSTATECTOMY N/A 06/17/2018   Procedure: XI ROBOTIC ASSISTED LAPAROSCOPIC RADICAL PROSTATECTOMY;  Surgeon: Sebastian Ache, MD;  Location: [facility];  Service: Urology;  Laterality: N/A;  3 HRS   TOTAL KNEE ARTHROPLASTY  01/30/2011   Procedure: TOTAL KNEE  ARTHROPLASTY;  Surgeon: Jacki Cones, MD;  Location: [facility];  Service: Orthopedics;  Laterality: Right;   TOTAL KNEE ARTHROPLASTY Left 01/31/2016   Procedure: LEFT TOTAL KNEE ARTHROPLASTY;  Surgeon: Ranee Gosselin, MD;  Location: [facility];  Service: Orthopedics;  Laterality: Left;   TRANSFORAMINAL LUMBAR INTERBODY FUSION (TLIF) WITH PEDICLE SCREW FIXATION 2 LEVEL N/A 01/17/2022   Procedure: L2-3 L3-4 Open Laminectomy/TLIF/posterolateral fusion;  Surgeon: Jadene Pierini, MD;  Location: [facility] OR;  Service: Neurosurgery;  Laterality: N/A;     Current Outpatient Medications on File Prior to Visit  Medication Sig Dispense Refill   acetaminophen (TYLENOL) 650 MG CR tablet Take 1,300 mg by mouth in the morning and at bedtime.     allopurinol (ZYLOPRIM) 300 MG tablet Take 300 mg by mouth daily.  12   amLODipine (NORVASC) 2.5 MG tablet Take 2.5 mg by mouth daily.     Ascorbic Acid (VITAMIN C PO) Take 3,000 mg by mouth daily.     celecoxib (CELEBREX) 200 MG capsule Take 200 mg by mouth 2 (two) times daily.     Cholecalciferol (DIALYVITE VITAMIN D 5000) 125 MCG (5000 UT) capsule Take 5,000 Units by mouth daily.     cilostazol (PLETAL) 100 MG tablet Take 100 mg by mouth daily.     clobetasol (OLUX) 0.05 % topical foam Apply 1 application topically daily as needed (irritation).      colchicine 0.6 MG tablet Take 0.6 mg by mouth daily as needed (gout).     Cyanocobalamin (B-12) 3000 MCG CAPS Take 3,000 mcg by mouth daily.     diclofenac Sodium (VOLTAREN) 1 % GEL Apply 1 Application topically daily.     fluorometholone (FML) 0.1 % ophthalmic suspension Place 1 drop into both eyes 2 (two) times daily.     gabapentin (NEURONTIN) 300 MG capsule Take 300 mg by mouth 2 (two) times daily.     Hypromellose 0.3 % SOLN Place 1-2 drops into both eyes in the morning and at bedtime. RETAINE [facility] 0.3%     Lancets (ONETOUCH DELICA PLUS LANCET33G) MISC SMARTSIG:Topical 2-4 Times Daily     Multiple Vitamin (MULTIVITAMIN  WITH MINERALS) TABS tablet Take 1 tablet by mouth daily.     mupirocin ointment (BACTROBAN) 2 % Apply 1 Application topically daily. 30 g 2   olmesartan (BENICAR) 20 MG tablet Take 20 mg by mouth daily.     ONE TOUCH ULTRA TEST test strip  pravastatin (PRAVACHOL) 20 MG tablet Take 20 mg by mouth daily.     saccharomyces boulardii (FLORASTOR) 250 MG capsule Take 250 mg by mouth daily.     Semaglutide, 1 MG/DOSE, (OZEMPIC, 1 MG/DOSE,) 4 MG/3ML SOPN Inject 1 mg into the skin once a week.     Wheat Dextrin (BENEFIBER DRINK MIX PO) Take 1 Scoop by mouth daily.     zinc gluconate 50 MG tablet Take 50 mg by mouth daily.     zolpidem (AMBIEN) 10 MG tablet Take 10 mg by mouth at bedtime as needed for sleep.      No current facility-administered medications on file prior to visit.    No Known Allergies   DIAGNOSTIC DATA (LABS, IMAGING, TESTING) - I reviewed patient records, labs, notes, testing and imaging myself where available.  Lab Results  Component Value Date   WBC 6.1 01/03/2022   HGB 12.3 (L) 01/03/2022   HCT 37.3 (L) 01/03/2022   MCV 101.9 (H) 01/03/2022   PLT 187 01/03/2022      Component Value Date/Time   NA 137 01/03/2022 0845   K 5.3 (H) 01/03/2022 0845   CL 107 01/03/2022 0845   CO2 23 01/03/2022 0845   GLUCOSE 91 01/03/2022 0845   BUN 25 (H) 01/03/2022 0845   CREATININE 1.17 01/03/2022 0845   CALCIUM 10.3 01/03/2022 0845   PROT 7.3 01/26/2016 1340   ALBUMIN 4.7 01/26/2016 1340   AST 22 01/26/2016 1340   ALT 25 01/26/2016 1340   ALKPHOS 63 01/26/2016 1340   BILITOT 1.0 01/26/2016 1340   GFRNONAA >60 01/03/2022 0845   GFRAA >60 02/01/2019 0858   No results found for: "CHOL", "HDL", "LDLCALC", "LDLDIRECT", "TRIG", "CHOLHDL" Lab Results  Component Value Date   HGBA1C 5.9 (H) 01/03/2022   No results found for: "VITAMINB12" Lab Results  Component Value Date   TSH 1.37 01/05/2014    PHYSICAL EXAM:  Today's Vitals   03/27/23 0749  BP: 120/75  Pulse: 74   Weight: 269 lb 9.6 oz (122.3 kg)  Height: 6' (1.829 m)   Body mass index is 36.56 kg/m.   Wt Readings from Last 3 Encounters:  03/27/23 269 lb 9.6 oz (122.3 kg)  12/23/22 254 lb (115.2 kg)  12/05/22 250 lb (113.4 kg)     Ht Readings from Last 3 Encounters:  03/27/23 6' (1.829 m)  12/23/22 6' (1.829 m)  12/05/22 6\' 1"  (1.854 m)      General: The patient is awake, alert and appears not in acute distress. The patient is well groomed. Head: Normocephalic, atraumatic. Neck is supple.  The patient is awake, alert and appears not in acute distress. The patient is well groomed. Head:  Normocephalic, atraumatic. Neck is supple. Mallampati 2-3,  neck circumference: 20 inches . Nasal airflow  patent.   Retrognathia is not seen. Facial hair.  Dental status: biological  Cardiovascular:  Regular rate and cardiac rhythm by pulse,  without distended neck veins. Respiratory: Lungs are clear.  Skin:  Without evidence of ankle edema, or rash. Trunk: The patient's posture is erect.   Neurologic exam : The patient is awake and alert, oriented to place and time.   Memory subjective described as intact.  Attention span & concentration ability appears normal.  Speech is fluent,  without  dysarthria, dysphonia or aphasia.  Mood and affect are appropriate.   Cranial nerves: no loss of smell or taste reported  Extraocular movements in vertical and horizontal planes were intact and without nystagmus.  No Diplopia.  Facial motor strength is symmetric and tongue and uvula move midline.  Neck ROM : rotation, tilt and flexion extension were normal for age and shoulder shrug was symmetrical.    Motor exam:  Symmetric bulk, tone and ROM.   Normal tone without cog wheeling, symmetric grip strength .   Sensory:  Fine touch, pinprick and vibration were abnormal, numbness extend though feet, ankles and up to patella. No vibration sensed at knee, ankles.  Reports a pain from the right thoracic nerve 10 down  to the hip and back of the knee.      Coordination: Rapid alternating movements in the fingers/hands were of normal speed.  The Finger-to-nose maneuver was intact without evidence of ataxia, dysmetria or tremor.   Gait and station: Patient could rise unassisted from a seated position, walked without assistive device.  Stance is of normal width/ base and the patient turned with 3 steps.  Toe and heel walk were deferred.  Deep tendon reflexes: in the  upper and lower extremities are symmetrically attenuated and intact. Amputation of the right middle toe.   Bilateral knee replacement- 2013. Babinski response was deferred .      ASSESSMENT AND PLAN 73 y.o. year old male  here with:    1) longstanding neuropathy, peripherally.   2) old radiculopathic changes   3) focal thoracic pain.     I plan to follow up prn.   I would like to thank Garlan Fillers, MD and Garlan Fillers, Md [address] [place],  [place] [address] for allowing me to meet with and to take care of this pleasant patient.     After spending a total time of  30  minutes face to face and additional time for physical and neurologic examination, review of laboratory studies,  personal review of imaging studies, reports and results of other testing and review of referral information / records as far as provided in visit,   Electronically signed by: Melvyn Novas, MD 03/27/2023 8:22 AM  [facility] Board certified by The [employer] of Sleep Medicine and Diplomate of the [employer] of [employer]. Board certified In Neurology through the ABPN, Fellow of the [employer] of [employer].

## 2023-04-11 ENCOUNTER — Ambulatory Visit (INDEPENDENT_AMBULATORY_CARE_PROVIDER_SITE_OTHER): Payer: Medicare PPO | Admitting: Podiatry

## 2023-04-11 ENCOUNTER — Encounter: Payer: Self-pay | Admitting: Podiatry

## 2023-04-11 DIAGNOSIS — E11628 Type 2 diabetes mellitus with other skin complications: Secondary | ICD-10-CM | POA: Diagnosis not present

## 2023-04-11 DIAGNOSIS — M79674 Pain in right toe(s): Secondary | ICD-10-CM

## 2023-04-11 DIAGNOSIS — L089 Local infection of the skin and subcutaneous tissue, unspecified: Secondary | ICD-10-CM | POA: Diagnosis not present

## 2023-04-11 DIAGNOSIS — B351 Tinea unguium: Secondary | ICD-10-CM | POA: Diagnosis not present

## 2023-04-11 DIAGNOSIS — M79675 Pain in left toe(s): Secondary | ICD-10-CM

## 2023-04-11 NOTE — Progress Notes (Signed)
 This patient returns to my office for  an evaluation of his third toe right foot.  This patient requires this care by a professional since this patient will be at risk due to having diabetes with neuropathy.Marland Kitchen    He presents to the office for evaluation and treatment.    General Appearance  Alert, conversant and in no acute stress.  Vascular  Dorsalis pedis and posterior tibial  pulses are weaky  palpable  bilaterally.  Capillary return is within normal limits  bilaterally. Temperature is within normal limits  bilaterally.  Neurologic  Senn-Weinstein monofilament wire test within normal limits  bilaterally. Muscle power within normal limits bilaterally.  Nails Thick disfigured discolored nails with subungual debris  from hallux to fifth toes bilaterally. No evidence of bacterial infection or drainage bilaterally.    Orthopedic  No limitations of motion  feet .  No crepitus or effusions noted.  No bony pathology or digital deformities noted.  Asymptomatic  HAV.  Limited ROM STJ right foot. 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  Skin  normotropic skin with no porokeratosis noted bilaterally.  No signs of infections or ulcers noted.     Onychomycosis  Debride nails with nail nipper and dremel tool.   Return office visit    3 months               Told patient to return for periodic foot care and evaluation due to potential at risk complications.   Helane Gunther DPM

## 2023-04-15 DIAGNOSIS — E1121 Type 2 diabetes mellitus with diabetic nephropathy: Secondary | ICD-10-CM | POA: Diagnosis not present

## 2023-04-15 DIAGNOSIS — I10 Essential (primary) hypertension: Secondary | ICD-10-CM | POA: Diagnosis not present

## 2023-04-15 DIAGNOSIS — E785 Hyperlipidemia, unspecified: Secondary | ICD-10-CM | POA: Diagnosis not present

## 2023-04-15 DIAGNOSIS — M109 Gout, unspecified: Secondary | ICD-10-CM | POA: Diagnosis not present

## 2023-04-15 DIAGNOSIS — Z125 Encounter for screening for malignant neoplasm of prostate: Secondary | ICD-10-CM | POA: Diagnosis not present

## 2023-04-15 DIAGNOSIS — Z1212 Encounter for screening for malignant neoplasm of rectum: Secondary | ICD-10-CM | POA: Diagnosis not present

## 2023-04-18 DIAGNOSIS — M160 Bilateral primary osteoarthritis of hip: Secondary | ICD-10-CM | POA: Diagnosis not present

## 2023-04-18 DIAGNOSIS — M7632 Iliotibial band syndrome, left leg: Secondary | ICD-10-CM | POA: Diagnosis not present

## 2023-04-18 DIAGNOSIS — M7631 Iliotibial band syndrome, right leg: Secondary | ICD-10-CM | POA: Diagnosis not present

## 2023-04-22 DIAGNOSIS — Z6837 Body mass index (BMI) 37.0-37.9, adult: Secondary | ICD-10-CM | POA: Diagnosis not present

## 2023-04-22 DIAGNOSIS — R82998 Other abnormal findings in urine: Secondary | ICD-10-CM | POA: Diagnosis not present

## 2023-04-22 DIAGNOSIS — R2681 Unsteadiness on feet: Secondary | ICD-10-CM | POA: Diagnosis not present

## 2023-04-22 DIAGNOSIS — I10 Essential (primary) hypertension: Secondary | ICD-10-CM | POA: Diagnosis not present

## 2023-04-22 DIAGNOSIS — I739 Peripheral vascular disease, unspecified: Secondary | ICD-10-CM | POA: Diagnosis not present

## 2023-04-22 DIAGNOSIS — Z1339 Encounter for screening examination for other mental health and behavioral disorders: Secondary | ICD-10-CM | POA: Diagnosis not present

## 2023-04-22 DIAGNOSIS — E785 Hyperlipidemia, unspecified: Secondary | ICD-10-CM | POA: Diagnosis not present

## 2023-04-22 DIAGNOSIS — E1121 Type 2 diabetes mellitus with diabetic nephropathy: Secondary | ICD-10-CM | POA: Diagnosis not present

## 2023-04-22 DIAGNOSIS — E1151 Type 2 diabetes mellitus with diabetic peripheral angiopathy without gangrene: Secondary | ICD-10-CM | POA: Diagnosis not present

## 2023-04-22 DIAGNOSIS — F5101 Primary insomnia: Secondary | ICD-10-CM | POA: Diagnosis not present

## 2023-04-22 DIAGNOSIS — Z1331 Encounter for screening for depression: Secondary | ICD-10-CM | POA: Diagnosis not present

## 2023-04-22 DIAGNOSIS — Z0000 Encounter for general adult medical examination without abnormal findings: Secondary | ICD-10-CM | POA: Diagnosis not present

## 2023-05-30 DIAGNOSIS — M160 Bilateral primary osteoarthritis of hip: Secondary | ICD-10-CM | POA: Diagnosis not present

## 2023-05-30 DIAGNOSIS — M545 Low back pain, unspecified: Secondary | ICD-10-CM | POA: Diagnosis not present

## 2023-05-30 DIAGNOSIS — M7062 Trochanteric bursitis, left hip: Secondary | ICD-10-CM | POA: Diagnosis not present

## 2023-05-30 DIAGNOSIS — M7061 Trochanteric bursitis, right hip: Secondary | ICD-10-CM | POA: Diagnosis not present

## 2023-05-30 DIAGNOSIS — G600 Hereditary motor and sensory neuropathy: Secondary | ICD-10-CM | POA: Diagnosis not present

## 2023-06-04 DIAGNOSIS — M160 Bilateral primary osteoarthritis of hip: Secondary | ICD-10-CM | POA: Diagnosis not present

## 2023-06-25 DIAGNOSIS — M542 Cervicalgia: Secondary | ICD-10-CM | POA: Diagnosis not present

## 2023-06-25 DIAGNOSIS — M5459 Other low back pain: Secondary | ICD-10-CM | POA: Diagnosis not present

## 2023-06-25 DIAGNOSIS — M546 Pain in thoracic spine: Secondary | ICD-10-CM | POA: Diagnosis not present

## 2023-06-25 DIAGNOSIS — M545 Low back pain, unspecified: Secondary | ICD-10-CM | POA: Diagnosis not present

## 2023-07-07 DIAGNOSIS — C44629 Squamous cell carcinoma of skin of left upper limb, including shoulder: Secondary | ICD-10-CM | POA: Diagnosis not present

## 2023-07-07 DIAGNOSIS — D485 Neoplasm of uncertain behavior of skin: Secondary | ICD-10-CM | POA: Diagnosis not present

## 2023-07-07 DIAGNOSIS — L570 Actinic keratosis: Secondary | ICD-10-CM | POA: Diagnosis not present

## 2023-07-07 DIAGNOSIS — D225 Melanocytic nevi of trunk: Secondary | ICD-10-CM | POA: Diagnosis not present

## 2023-07-07 DIAGNOSIS — Z85828 Personal history of other malignant neoplasm of skin: Secondary | ICD-10-CM | POA: Diagnosis not present

## 2023-07-07 DIAGNOSIS — D692 Other nonthrombocytopenic purpura: Secondary | ICD-10-CM | POA: Diagnosis not present

## 2023-07-07 DIAGNOSIS — L821 Other seborrheic keratosis: Secondary | ICD-10-CM | POA: Diagnosis not present

## 2023-07-11 DIAGNOSIS — G8929 Other chronic pain: Secondary | ICD-10-CM | POA: Diagnosis not present

## 2023-07-11 DIAGNOSIS — M4316 Spondylolisthesis, lumbar region: Secondary | ICD-10-CM | POA: Diagnosis not present

## 2023-07-11 DIAGNOSIS — M546 Pain in thoracic spine: Secondary | ICD-10-CM | POA: Diagnosis not present

## 2023-07-11 DIAGNOSIS — R2681 Unsteadiness on feet: Secondary | ICD-10-CM | POA: Diagnosis not present

## 2023-07-11 DIAGNOSIS — M5416 Radiculopathy, lumbar region: Secondary | ICD-10-CM | POA: Diagnosis not present

## 2023-07-15 ENCOUNTER — Encounter: Payer: Self-pay | Admitting: Student

## 2023-07-15 ENCOUNTER — Other Ambulatory Visit: Payer: Self-pay | Admitting: Student

## 2023-07-15 DIAGNOSIS — R2681 Unsteadiness on feet: Secondary | ICD-10-CM

## 2023-07-15 DIAGNOSIS — M5416 Radiculopathy, lumbar region: Secondary | ICD-10-CM

## 2023-07-15 DIAGNOSIS — G8929 Other chronic pain: Secondary | ICD-10-CM

## 2023-07-16 ENCOUNTER — Ambulatory Visit (INDEPENDENT_AMBULATORY_CARE_PROVIDER_SITE_OTHER): Admitting: Podiatry

## 2023-07-16 ENCOUNTER — Encounter: Payer: Self-pay | Admitting: Podiatry

## 2023-07-16 DIAGNOSIS — M79674 Pain in right toe(s): Secondary | ICD-10-CM

## 2023-07-16 DIAGNOSIS — E1140 Type 2 diabetes mellitus with diabetic neuropathy, unspecified: Secondary | ICD-10-CM | POA: Diagnosis not present

## 2023-07-16 DIAGNOSIS — L089 Local infection of the skin and subcutaneous tissue, unspecified: Secondary | ICD-10-CM

## 2023-07-16 DIAGNOSIS — E11628 Type 2 diabetes mellitus with other skin complications: Secondary | ICD-10-CM

## 2023-07-16 DIAGNOSIS — M79675 Pain in left toe(s): Secondary | ICD-10-CM

## 2023-07-16 DIAGNOSIS — B351 Tinea unguium: Secondary | ICD-10-CM

## 2023-07-16 NOTE — Progress Notes (Signed)
 This patient returns to my office for  an evaluation of his third toe right foot.  This patient requires this care by a professional since this patient will be at risk due to having diabetes with neuropathy.Marland Kitchen    He presents to the office for evaluation and treatment.    General Appearance  Alert, conversant and in no acute stress.  Vascular  Dorsalis pedis and posterior tibial  pulses are weaky  palpable  bilaterally.  Capillary return is within normal limits  bilaterally. Temperature is within normal limits  bilaterally.  Neurologic  Senn-Weinstein monofilament wire test within normal limits  bilaterally. Muscle power within normal limits bilaterally.  Nails Thick disfigured discolored nails with subungual debris  from hallux to fifth toes bilaterally. No evidence of bacterial infection or drainage bilaterally.    Orthopedic  No limitations of motion  feet .  No crepitus or effusions noted.  No bony pathology or digital deformities noted.  Asymptomatic  HAV.  Limited ROM STJ right foot. 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  Skin  normotropic skin with no porokeratosis noted bilaterally.  No signs of infections or ulcers noted.     Onychomycosis  Debride nails with nail nipper and dremel tool.   Return office visit    3 months               Told patient to return for periodic foot care and evaluation due to potential at risk complications.   Helane Gunther DPM

## 2023-07-19 DIAGNOSIS — M542 Cervicalgia: Secondary | ICD-10-CM | POA: Diagnosis not present

## 2023-07-24 ENCOUNTER — Other Ambulatory Visit

## 2023-07-24 ENCOUNTER — Ambulatory Visit
Admission: RE | Admit: 2023-07-24 | Discharge: 2023-07-24 | Disposition: A | Discharge status: Home / Self Care | Source: Ambulatory Visit | Attending: Student | Admitting: Student

## 2023-07-24 DIAGNOSIS — M5416 Radiculopathy, lumbar region: Secondary | ICD-10-CM

## 2023-07-24 DIAGNOSIS — G8929 Other chronic pain: Secondary | ICD-10-CM

## 2023-07-24 DIAGNOSIS — M5124 Other intervertebral disc displacement, thoracic region: Secondary | ICD-10-CM | POA: Diagnosis not present

## 2023-07-24 DIAGNOSIS — M4804 Spinal stenosis, thoracic region: Secondary | ICD-10-CM | POA: Diagnosis not present

## 2023-07-24 DIAGNOSIS — Z981 Arthrodesis status: Secondary | ICD-10-CM | POA: Diagnosis not present

## 2023-07-24 DIAGNOSIS — M48061 Spinal stenosis, lumbar region without neurogenic claudication: Secondary | ICD-10-CM | POA: Diagnosis not present

## 2023-07-24 MED ORDER — GADOPICLENOL 0.5 MMOL/ML IV SOLN
10.0000 mL | Freq: Once | INTRAVENOUS | Status: AC | PRN
  Administered 2023-07-24: 10 mL via INTRAVENOUS

## 2023-07-29 DIAGNOSIS — M542 Cervicalgia: Secondary | ICD-10-CM | POA: Diagnosis not present

## 2023-08-07 DIAGNOSIS — M6208 Separation of muscle (nontraumatic), other site: Secondary | ICD-10-CM | POA: Diagnosis not present

## 2023-08-20 DIAGNOSIS — M542 Cervicalgia: Secondary | ICD-10-CM | POA: Diagnosis not present

## 2023-08-22 DIAGNOSIS — Z6836 Body mass index (BMI) 36.0-36.9, adult: Secondary | ICD-10-CM | POA: Diagnosis not present

## 2023-08-22 DIAGNOSIS — M48062 Spinal stenosis, lumbar region with neurogenic claudication: Secondary | ICD-10-CM | POA: Diagnosis not present

## 2023-09-03 DIAGNOSIS — M542 Cervicalgia: Secondary | ICD-10-CM | POA: Diagnosis not present

## 2023-09-10 DIAGNOSIS — M542 Cervicalgia: Secondary | ICD-10-CM | POA: Diagnosis not present

## 2023-09-16 DIAGNOSIS — E1121 Type 2 diabetes mellitus with diabetic nephropathy: Secondary | ICD-10-CM | POA: Diagnosis not present

## 2023-09-16 DIAGNOSIS — M545 Low back pain, unspecified: Secondary | ICD-10-CM | POA: Diagnosis not present

## 2023-09-18 DIAGNOSIS — M542 Cervicalgia: Secondary | ICD-10-CM | POA: Diagnosis not present

## 2023-09-23 DIAGNOSIS — C61 Malignant neoplasm of prostate: Secondary | ICD-10-CM | POA: Diagnosis not present

## 2023-09-23 DIAGNOSIS — M545 Low back pain, unspecified: Secondary | ICD-10-CM | POA: Diagnosis not present

## 2023-09-30 DIAGNOSIS — N5201 Erectile dysfunction due to arterial insufficiency: Secondary | ICD-10-CM | POA: Diagnosis not present

## 2023-09-30 DIAGNOSIS — M5459 Other low back pain: Secondary | ICD-10-CM | POA: Diagnosis not present

## 2023-09-30 DIAGNOSIS — C61 Malignant neoplasm of prostate: Secondary | ICD-10-CM | POA: Diagnosis not present

## 2023-09-30 DIAGNOSIS — N393 Stress incontinence (female) (male): Secondary | ICD-10-CM | POA: Diagnosis not present

## 2023-10-07 DIAGNOSIS — M5459 Other low back pain: Secondary | ICD-10-CM | POA: Diagnosis not present

## 2023-10-14 DIAGNOSIS — M545 Low back pain, unspecified: Secondary | ICD-10-CM | POA: Diagnosis not present

## 2023-10-15 DIAGNOSIS — H35373 Puckering of macula, bilateral: Secondary | ICD-10-CM | POA: Diagnosis not present

## 2023-10-15 DIAGNOSIS — H40013 Open angle with borderline findings, low risk, bilateral: Secondary | ICD-10-CM | POA: Diagnosis not present

## 2023-10-15 DIAGNOSIS — H04123 Dry eye syndrome of bilateral lacrimal glands: Secondary | ICD-10-CM | POA: Diagnosis not present

## 2023-10-15 DIAGNOSIS — E119 Type 2 diabetes mellitus without complications: Secondary | ICD-10-CM | POA: Diagnosis not present

## 2023-10-15 DIAGNOSIS — H524 Presbyopia: Secondary | ICD-10-CM | POA: Diagnosis not present

## 2023-10-15 DIAGNOSIS — M542 Cervicalgia: Secondary | ICD-10-CM | POA: Diagnosis not present

## 2023-10-16 ENCOUNTER — Ambulatory Visit (INDEPENDENT_AMBULATORY_CARE_PROVIDER_SITE_OTHER): Payer: Self-pay | Admitting: Podiatry

## 2023-10-16 VITALS — Ht 72.0 in | Wt 269.6 lb

## 2023-10-16 DIAGNOSIS — B351 Tinea unguium: Secondary | ICD-10-CM

## 2023-10-16 DIAGNOSIS — M79674 Pain in right toe(s): Secondary | ICD-10-CM

## 2023-10-16 DIAGNOSIS — S90221A Contusion of right lesser toe(s) with damage to nail, initial encounter: Secondary | ICD-10-CM | POA: Diagnosis not present

## 2023-10-16 DIAGNOSIS — M79675 Pain in left toe(s): Secondary | ICD-10-CM | POA: Diagnosis not present

## 2023-10-16 MED ORDER — CEPHALEXIN 500 MG PO CAPS: 500.0000 mg | ORAL_CAPSULE | Freq: Three times a day (TID) | ORAL | 0 refills | Status: DC

## 2023-10-16 MED ORDER — MUPIROCIN 2 % EX OINT: 1.0000 | TOPICAL_OINTMENT | Freq: Every day | CUTANEOUS | 2 refills | Status: AC

## 2023-10-19 ENCOUNTER — Encounter: Payer: Self-pay | Admitting: Podiatry

## 2023-10-19 NOTE — Progress Notes (Signed)
  Subjective:  Patient ID: Raymond Herring, male    DOB: October 25, 1950,  MRN: [id number]  Chief Complaint  Patient presents with   Toe Injury    RM 21 Injured 2nd toe on right foot- the end of the toe is a flap of skin that includes the nail lot of blood.    73 y.o. male presents with the above complaint. History confirmed with patient.  The issue happened this morning.  The other nails are thickened elongated and causing discomfort he is due to see Dr. Loreda next week for this.  Objective:  Physical Exam: warm, good capillary refill, normal DP and PT pulses, and diffuse polyneuropathy, previous partial right third toe amputation, right second toenail is loose with bleeding distally attached. Left Foot: dystrophic yellowed discolored nail plates with subungual debris Right Foot: dystrophic yellowed discolored nail plates with subungual debris   Assessment:   1. Contusion of lesser toe of right foot with damage to nail, initial encounter   2. Pain due to onychomycosis of toenails of both feet      Plan:  Patient was evaluated and treated and all questions answered.   Discussed the etiology and treatment options for the condition in detail with the patient. Recommended debridement of the nails today. Sharp and mechanical debridement performed of all painful and mycotic nails today. Nails debrided in length and thickness using a nail nipper to level of comfort. Follow up as needed for painful nails.  Has an injury to the right second toenail, there is no ecchymosis or bony or joint abnormality to indicate fracture so we did not take x-rays.  I did recommend avulsion of the nail plate.  Following verbal consent the right second toe was prepped with Betadine and a tourniquet secured around the base of the toe.  It was only attached distally and a Therapist, nutritional and tissue nipper was used to remove this and the nail plate.  It was irrigated with alcohol  and dressed with Silvadene and a  compression bandage.  Rx for cephalexin and mupirocin  ointment sent to pharmacy.  Post care instructions given.  Return in 3 weeks to reevaluate.  Return in about 3 weeks (around 11/06/2023) for wound care.

## 2023-10-20 ENCOUNTER — Ambulatory Visit: Admitting: Podiatry

## 2023-11-11 ENCOUNTER — Ambulatory Visit (INDEPENDENT_AMBULATORY_CARE_PROVIDER_SITE_OTHER)

## 2023-11-11 ENCOUNTER — Ambulatory Visit (INDEPENDENT_AMBULATORY_CARE_PROVIDER_SITE_OTHER): Admitting: Podiatry

## 2023-11-11 ENCOUNTER — Other Ambulatory Visit: Payer: Self-pay | Admitting: Podiatry

## 2023-11-11 VITALS — Ht 72.0 in | Wt 269.9 lb

## 2023-11-11 DIAGNOSIS — L97514 Non-pressure chronic ulcer of other part of right foot with necrosis of bone: Secondary | ICD-10-CM

## 2023-11-11 MED ORDER — DOXYCYCLINE HYCLATE 100 MG PO TABS
100.0000 mg | ORAL_TABLET | Freq: Two times a day (BID) | ORAL | 0 refills | Status: DC
Start: 2023-11-11 — End: 2023-11-17

## 2023-11-12 NOTE — Progress Notes (Signed)
  Subjective:  Patient ID: Raymond Herring, male    DOB: 31-Mar-1950,  MRN: [id number]  Chief Complaint  Patient presents with   Wound Check    Rm 2 Patient is here for ulcer of the right  2nd and 4th toe. Ulcers are open with moderate swelling. Pt states swelling in right leg. Pt has completed course of Cephalexin.    73 y.o. male presents with the above complaint. History confirmed with patient.  The issue on the fourth toe is new today  Objective:  Physical Exam: warm, good capillary refill, normal DP and PT pulses, and diffuse polyneuropathy, previous partial right third toe amputation, today full-thickness ulcer with hypergranular tissue of the second toe and the right fourth toe has a preulcerative scab Left Foot: dystrophic yellowed discolored nail plates with subungual debris Right Foot: dystrophic yellowed discolored nail plates with subungual debris   Radiographs taken today of the right foot shows osteolysis of the distal phalanx of the right second toe, no erosion of the fourth toe Assessment:   1. 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  I debrided this to expose the underlying tissue and wound culture was taken of this.  He did not proceed with partial amputation at this point.  Discussed with him we will start him on doxycycline  and may be a candidate for dalbavancin treatment although overall success rate with toe osteomyelitis can be limited.  I will see him back in 3 weeks to reevaluate I will let him know the culture results and refer him for dalbavancin infusion if this is appropriate for him.  Return in about 3 weeks (around 12/02/2023) for wound care.

## 2023-11-14 LAB — WOUND CULTURE
MICRO NUMBER:: 17157729
SPECIMEN QUALITY:: ADEQUATE

## 2023-11-14 LAB — HOUSE ACCOUNT TRACKING

## 2023-11-17 ENCOUNTER — Ambulatory Visit: Payer: Self-pay | Admitting: Podiatry

## 2023-11-17 MED ORDER — CIPROFLOXACIN HCL 500 MG PO TABS: 500.0000 mg | ORAL_TABLET | Freq: Two times a day (BID) | ORAL | 0 refills | Status: AC

## 2023-11-25 DIAGNOSIS — M48062 Spinal stenosis, lumbar region with neurogenic claudication: Secondary | ICD-10-CM | POA: Diagnosis not present

## 2023-11-25 DIAGNOSIS — M5416 Radiculopathy, lumbar region: Secondary | ICD-10-CM | POA: Diagnosis not present

## 2023-12-02 ENCOUNTER — Ambulatory Visit (INDEPENDENT_AMBULATORY_CARE_PROVIDER_SITE_OTHER): Admitting: Podiatry

## 2023-12-02 DIAGNOSIS — L97514 Non-pressure chronic ulcer of other part of right foot with necrosis of bone: Secondary | ICD-10-CM

## 2023-12-02 MED ORDER — CIPROFLOXACIN HCL 500 MG PO TABS: 500.0000 mg | ORAL_TABLET | Freq: Two times a day (BID) | ORAL | 0 refills | Status: AC

## 2023-12-04 NOTE — Progress Notes (Signed)
  Subjective:  Patient ID: Raymond Herring, male    DOB: 26-Sep-1950,  MRN: [id number]  Chief Complaint  Patient presents with   ulcer    R 2nd and 4th toe ulcers are closed minimal drainage.  Diabetic A1c 5.9. no anti coag.    73 y.o. male presents with the above complaint. History confirmed with patient.  Notes quite a bit of improvement he had headaches taking the ciprofloxacin but no other adverse effects  Objective:  Physical Exam: warm, good capillary refill, normal DP and PT pulses, and diffuse polyneuropathy, previous partial right third toe amputation, today the second toe ulcer has fully healed the right fourth toe ulcer is nearly fully healed Left Foot: dystrophic yellowed discolored nail plates with subungual debris Right Foot: dystrophic yellowed discolored nail plates with subungual debris   Radiographs taken today of the right foot shows osteolysis of the distal phalanx of the right second toe, no erosion of the fourth toe  Culture taken 11/11/2023 with Pseudomonas Assessment:   1. 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  Has had improvement with ciprofloxacin therapy.  We discussed further treatment.  Treating for the osteomyelitis will take a longer course and I refilled his prescription for the ciprofloxacin.  I will see him back in 3 weeks to reevaluate.  May need a third course thereafter pending his progress we will take x-rays at that visit to reevaluate.  Return in about 3 weeks (around 12/23/2023) for wound care, take new R foot xrays.

## 2023-12-08 ENCOUNTER — Other Ambulatory Visit: Payer: Self-pay | Admitting: Neurosurgery

## 2023-12-23 ENCOUNTER — Ambulatory Visit: Admitting: Podiatry

## 2023-12-23 ENCOUNTER — Ambulatory Visit

## 2023-12-23 VITALS — Ht 72.0 in | Wt 269.9 lb

## 2023-12-23 DIAGNOSIS — L97514 Non-pressure chronic ulcer of other part of right foot with necrosis of bone: Secondary | ICD-10-CM

## 2023-12-23 MED ORDER — CIPROFLOXACIN HCL 500 MG PO TABS: 500.0000 mg | ORAL_TABLET | Freq: Two times a day (BID) | ORAL | 0 refills | Status: AC

## 2023-12-24 NOTE — Progress Notes (Signed)
°  Subjective:  Patient ID: Raymond Herring, male    DOB: 03-Oct-1950,  MRN: [id number]  Chief Complaint  Patient presents with   Wound Check    Rm 1 Patient is here for wound of right foot. Ulcer of the right 3rd toe, is scabbed over with  minimum drainage.    73 y.o. male presents with the above complaint. History confirmed with patient.  Returns for follow-up, notes improvement once again.  Objective:  Physical Exam: warm, good capillary refill, normal DP and PT pulses, and diffuse polyneuropathy, previous partial right third toe amputation, today the second toe ulcer has fully healed the right fourth toe ulcer is nearly fully healed Left Foot: dystrophic yellowed discolored nail plates with subungual debris Right Foot: dystrophic yellowed discolored nail plates with subungual debris      Radiographs taken today of the right foot shows improvement in consolidation around the distal phalanx of the second toe  Culture taken 11/11/2023 with Pseudomonas Assessment:   1. 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  Returns for follow-up today, radiographs x-rays show improvement and clinically shows improvement as well still has a partial-thickness ulcer on the fourth toe.  Long-term will need flexor tenotomy of both the 2nd and 4th toe to reduce contracture and pressure and recurrence of ulceration.  Will plan to do this at the next visit if he continues to improve I recommended a final course of ciprofloxacin  and this was sent to his pharmacy.  Follow-up in 2 weeks to reevaluate.  Return in about 2 weeks (around 01/06/2024) for wound care, flexor tenotomy right 2nd and 4th.

## 2024-01-05 ENCOUNTER — Ambulatory Visit (INDEPENDENT_AMBULATORY_CARE_PROVIDER_SITE_OTHER)

## 2024-01-05 DIAGNOSIS — L089 Local infection of the skin and subcutaneous tissue, unspecified: Secondary | ICD-10-CM

## 2024-01-05 DIAGNOSIS — L97521 Non-pressure chronic ulcer of other part of left foot limited to breakdown of skin: Secondary | ICD-10-CM

## 2024-01-05 DIAGNOSIS — E11628 Type 2 diabetes mellitus with other skin complications: Secondary | ICD-10-CM

## 2024-01-05 DIAGNOSIS — L97514 Non-pressure chronic ulcer of other part of right foot with necrosis of bone: Secondary | ICD-10-CM

## 2024-01-05 NOTE — Progress Notes (Signed)
 "    Subjective:  Patient ID: Raymond Herring, male    DOB: [birth year],  MRN: [id number]  Chief Complaint  Patient presents with   Toe Pain    Rm10 Patient follow up on right 4th and 2nd toe wound check.    73 y.o. male presents with the above complaint. He is here for follow-up of right foot wounds and possible flexor tenotomy to digits 2&4. He relates that the second toe ulcer is healed and he has not seen any drainage to his 4th digit. Separately, he also notes a new wound to his left 4th toe.   Review of Systems: Negative except as noted in the HPI. Denies N/V/F/Ch.  Past Medical History:  Diagnosis Date   Anxiety    Arthritis    Diabetes mellitus    Diverticulosis    Headache    Hyperlipidemia    Hypertension    Neuropathy    feet and fingers   Prostate cancer (HCC)    Sarcoidosis of central nervous system    negatively affects gait   Skin cancer    Multiple areas removed. Managed by Dr. Odis Molt.   Umbilical hernia    Current Medications[1]  Tobacco Use History[2]  Allergies[3] Objective:  There were no vitals filed for this visit. There is no height or weight on file to calculate BMI.  Physical Exam: warm, good capillary refill, normal DP and PT pulses, and diffuse polyneuropathy - previous partial right third toe amputation, today the right second toe ulcer has fully healed the right fourth toe ulcer is nearly fully healed without signs of infection.  Right second toe straight without significant deformity, right fourth toe has flexible hammertoe contracture. - Left foot 4th digit has pinpoint ulceration without deep extension on the lateral aspect. 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   Left Foot: dystrophic yellowed discolored nail plates with subungual debris Right Foot: dystrophic yellowed discolored nail plates with subungual debris    Assessment:   1. 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 Toe ulcer, left, limited to breakdown of skin (HCC)    Plan:  - Patient was evaluated and treated and all questions answered.  Right foot 2nd and 4th ulcerations - Discussed with the patient his right foot ulcerations.  His second toe has fully healed.  The fourth digit does still have a minor wound without any signs of infection.  There is hammering of this digit that is reducible.  We discussed the utilization of a flexor tenotomy to his right fourth digit.  He expresses understanding of this and is in agreement.  Procedure note below  Left foot fourth digit ulceration - Superficial ulceration without deep extension or any signs of infection.  Patient does have mupirocin  at home.  He will apply this and a Band-Aid once daily and try to pull the fourth digit away from the fifth.  Return to clinic 1 week for postop check  Procedure: Right 4th toe flexor tenotomy Location: Right 4th toe at sulcus Skin Prep: betadine Injectate: None required due to neuropathy Technique: Attention was directed to the plantar aspect of the fourth digit which was noted to be hammered, reducible.  An 18-gauge needle was inserted into the plantar aspect of the toe at the sulcus from plantar to dorsal a side-to-side sweeping motion was utilized to transect the flexor  tendon.  Following tenotomy, the patient was asked to flex his toes and was unable to flex the distal fourth digit indicating complete tenotomy Disposition: Patient tolerated procedure well. Procedural site dressed with betadine and gauze. DSD applied.    Prentice Ovens, DPM AACFAS Fellowship Trained Podiatric Surgeon [facility]      [1]  Current Outpatient Medications:    acetaminophen  (TYLENOL ) 650 MG CR tablet, Take 1,300 mg by mouth in the morning and at bedtime., Disp: , Rfl:    allopurinol  (ZYLOPRIM ) 300 MG tablet, Take 300 mg by mouth daily., Disp: , Rfl: 12    Alpha-Lipoic Acid 100 MG CAPS, Take 2 a day on an empty stomach ., Disp: 60 capsule, Rfl: 5   amLODipine  (NORVASC ) 2.5 MG tablet, Take 2.5 mg by mouth daily., Disp: , Rfl:    Ascorbic Acid (VITAMIN C PO), Take 3,000 mg by mouth daily., Disp: , Rfl:    celecoxib  (CELEBREX ) 200 MG capsule, Take 200 mg by mouth 2 (two) times daily., Disp: , Rfl:    Cholecalciferol (DIALYVITE VITAMIN D 5000) 125 MCG (5000 UT) capsule, Take 5,000 Units by mouth daily., Disp: , Rfl:    cilostazol  (PLETAL ) 100 MG tablet, Take 100 mg by mouth daily., Disp: , Rfl:    clobetasol (OLUX) 0.05 % topical foam, Apply 1 application topically daily as needed (irritation). , Disp: , Rfl:    colchicine 0.6 MG tablet, Take 0.6 mg by mouth daily as needed (gout)., Disp: , Rfl:    Cyanocobalamin  (B-12) 3000 MCG CAPS, Take 3,000 mcg by mouth daily., Disp: , Rfl:    diclofenac Sodium (VOLTAREN) 1 % GEL, Apply 1 Application topically daily., Disp: , Rfl:    fluorometholone  (FML) 0.1 % ophthalmic suspension, Place 1 drop into both eyes 2 (two) times daily., Disp: , Rfl:    gabapentin  (NEURONTIN ) 300 MG capsule, Take 300 mg by mouth 2 (two) times daily., Disp: , Rfl:    Hypromellose 0.3 % SOLN, Place 1-2 drops into both eyes in the morning and at bedtime. RETAINE [facility] 0.3%, Disp: , Rfl:    Lancets (ONETOUCH DELICA PLUS LANCET33G) MISC, SMARTSIG:Topical 2-4 Times Daily, Disp: , Rfl:    Multiple Vitamin (MULTIVITAMIN WITH MINERALS) TABS tablet, Take 1 tablet by mouth daily., Disp: , Rfl:    mupirocin  ointment (BACTROBAN ) 2 %, Apply 1 Application topically daily., Disp: 30 g, Rfl: 2   olmesartan (BENICAR) 20 MG tablet, Take 20 mg by mouth daily., Disp: , Rfl:    ONE TOUCH ULTRA TEST test strip, , Disp: , Rfl:    pravastatin  (PRAVACHOL ) 20 MG tablet, Take 20 mg by mouth daily., Disp: , Rfl:    saccharomyces boulardii (FLORASTOR) 250 MG capsule, Take 250 mg by mouth daily., Disp: , Rfl:    Semaglutide, 1 MG/DOSE, (OZEMPIC, 1 MG/DOSE,) 4 MG/3ML  SOPN, Inject 1 mg into the skin once a week., Disp: , Rfl:    Wheat Dextrin (BENEFIBER DRINK MIX PO), Take 1 Scoop by mouth daily., Disp: , Rfl:    zinc gluconate 50 MG tablet, Take 50 mg by mouth daily., Disp: , Rfl:    zolpidem  (AMBIEN ) 10 MG tablet, Take 10 mg by mouth at bedtime as needed for sleep. , Disp: , Rfl:  [2]  Social History Tobacco Use  Smoking Status Former   Current packs/day: 0.00   Average packs/day: 2.0 packs/day for 25.0 years (50.0 ttl pk-yrs)   Types: Cigarettes   Start date: 01/25/1956   Quit  date: 01/24/1981   Years since quitting: 42.9  Smokeless Tobacco Never  [3] No Known Allergies  "

## 2024-01-06 ENCOUNTER — Ambulatory Visit: Admitting: Podiatry

## 2024-01-19 ENCOUNTER — Encounter (HOSPITAL_COMMUNITY): Payer: Self-pay

## 2024-01-19 ENCOUNTER — Encounter: Payer: Self-pay | Admitting: Podiatry

## 2024-01-19 ENCOUNTER — Ambulatory Visit (INDEPENDENT_AMBULATORY_CARE_PROVIDER_SITE_OTHER): Admitting: Podiatry

## 2024-01-19 DIAGNOSIS — E1140 Type 2 diabetes mellitus with diabetic neuropathy, unspecified: Secondary | ICD-10-CM | POA: Diagnosis not present

## 2024-01-19 DIAGNOSIS — M79675 Pain in left toe(s): Secondary | ICD-10-CM

## 2024-01-19 DIAGNOSIS — B351 Tinea unguium: Secondary | ICD-10-CM | POA: Diagnosis not present

## 2024-01-19 DIAGNOSIS — M79674 Pain in right toe(s): Secondary | ICD-10-CM

## 2024-01-19 NOTE — Progress Notes (Signed)
 Surgical Instructions   Your procedure is scheduled on Thursday January 29, 2024. Report to [facility] Main Entrance A at 5:30 A.M., then check in with the Admitting office. Any questions or running late day of surgery: call [phone]  Questions prior to your surgery date: call [phone], Monday-Friday, 8am-4pm. If you experience any cold or flu symptoms such as cough, fever, chills, shortness of breath, etc. between now and your scheduled surgery, please notify us  at the above number.     Remember:  Do not eat or drink after midnight the night before your surgery  Take these medicines the morning of surgery with A SIP OF WATER   acetaminophen  (TYLENOL )  allopurinol  (ZYLOPRIM )  amLODipine  (NORVASC )  amoxicillin (AMOXIL)  cycloSPORINE (RESTASIS) 0.05 % ophthalmic emulsion  fluorometholone  (FML) 0.1 % ophthalmic suspension  gabapentin  (NEURONTIN )  pravastatin  (PRAVACHOL )    May take these medicines IF NEEDED: colchicine    PER YOUR SURGEON'S INSTRUCTIONS, PLEASE HOLD YOUR celecoxib  (CELEBREX ) FOR 5 DAYS PRIOR TO SURGERY WITH THE LAST DOSE BEING 01/23/2024.    One week prior to surgery, STOP taking any Aspirin  (unless otherwise instructed by your surgeon) Aleve, Naproxen, Ibuprofen, Motrin, Advil, Goody's, BC's, all herbal medications, fish oil, and non-prescription vitamins.  This includes your diclofenac Sodium (VOLTAREN) 1 % GEL.     WHAT DO I DO ABOUT MY DIABETES MEDICATION?   Do not take oral diabetes medicines (pills) the morning of surgery.        DO NOT USE YOUR tirzepatide (MOUNJARO) 7 DAYS PRIOR TO SURGERY WITH THE LAST DOSE BEING NO LATER THAN 01/21/2024.    The day of surgery, do not take other diabetes injectables, including Byetta (exenatide), Bydureon (exenatide ER), Victoza (liraglutide), or Trulicity (dulaglutide).  If your CBG is greater than 220 mg/dL, you may take  of your sliding scale (correction) dose of insulin .   HOW TO MANAGE YOUR  DIABETES BEFORE AND AFTER SURGERY  Why is it important to control my blood sugar before and after surgery? Improving blood sugar levels before and after surgery helps healing and can limit problems. A way of improving blood sugar control is eating a healthy diet by:  Eating less sugar and carbohydrates  Increasing activity/exercise  Talking with your doctor about reaching your blood sugar goals High blood sugars (greater than 180 mg/dL) can raise your risk of infections and slow your recovery, so you will need to focus on controlling your diabetes during the weeks before surgery. Make sure that the doctor who takes care of your diabetes knows about your planned surgery including the date and location.  How do I manage my blood sugar before surgery? Check your blood sugar at least 4 times a day, starting 2 days before surgery, to make sure that the level is not too high or low.  Check your blood sugar the morning of your surgery when you wake up and every 2 hours until you get to the Short Stay unit.  If your blood sugar is less than 70 mg/dL, you will need to treat for low blood sugar: Do not take insulin . Treat a low blood sugar (less than 70 mg/dL) with  cup of clear juice (cranberry or apple), 4 glucose tablets, OR glucose gel. Recheck blood sugar in 15 minutes after treatment (to make sure it is greater than 70 mg/dL). If your blood sugar is not greater than 70 mg/dL on recheck, call [phone] for further instructions. Report your blood sugar to the short stay nurse when you get  to Short Stay.  If you are admitted to the hospital after surgery: Your blood sugar will be checked by the staff and you will probably be given insulin  after surgery (instead of oral diabetes medicines) to make sure you have good blood sugar levels. The goal for blood sugar control after surgery is 80-180 mg/dL.                      Do NOT Smoke (Tobacco/Vaping) for 24 hours prior to your procedure.  If  you use a CPAP at night, you may bring your mask/headgear for your overnight stay.   You will be asked to remove any contacts, glasses, piercing's, hearing aid's, dentures/partials prior to surgery. Please bring cases for these items if needed.    Patients discharged the day of surgery will not be allowed to drive home, and someone needs to stay with them for 24 hours.  SURGICAL WAITING ROOM VISITATION Patients may have no more than 2 support people in the waiting area - these visitors may rotate.   Pre-op nurse will coordinate an appropriate time for 1 ADULT support person, who may not rotate, to accompany patient in pre-op.  Children under the age of 11 must have an adult with them who is not the patient and must remain in the main waiting area with an adult.  If the patient needs to stay at the hospital during part of their recovery, the visitor guidelines for inpatient rooms apply.  Please refer to the [facility] website for the visitor guidelines for any additional information.   If you received a COVID test during your pre-op visit  it is requested that you wear a mask when out in public, stay away from anyone that may not be feeling well and notify your surgeon if you develop symptoms. If you have been in contact with anyone that has tested positive in the last 10 days please notify you surgeon.      Pre-operative 4 CHG Bathing Instructions   You can play a key role in reducing the risk of infection after surgery. Your skin needs to be as free of germs as possible. You can reduce the number of germs on your skin by washing with CHG (chlorhexidine  gluconate) soap before surgery. CHG is an antiseptic soap that kills germs and continues to kill germs even after washing.   DO NOT use if you have an allergy to chlorhexidine /CHG or antibacterial soaps. If your skin becomes reddened or irritated, stop using the CHG and notify one of our RNs at [phone].   Please shower with the CHG  soap starting 4 days before surgery using the following schedule:     Please keep in mind the following:  You may shave your face at any point before/day of surgery.  Place clean sheets on your bed the day you start using CHG soap. Use a clean washcloth (not used since being washed) for each shower. DO NOT sleep with pets once you start using the CHG.   CHG Shower Instructions:  Wash your face and private area with normal soap. If you choose to wash your hair, wash first with your normal shampoo.  After you use shampoo/soap, rinse your hair and body thoroughly to remove shampoo/soap residue.  Turn the water  OFF and apply  bottle of CHG soap to a CLEAN washcloth.  Apply CHG soap ONLY FROM YOUR NECK DOWN TO YOUR TOES (washing for 3-5 minutes)  DO NOT use CHG soap on  face, private areas, open wounds, or sores.  Pay special attention to the area where your surgery is being performed.  If you are having back surgery, having someone wash your back for you may be helpful. Wait 2 minutes after CHG soap is applied, then you may rinse off the CHG soap.  Pat dry with a clean towel  Put on clean clothes/pajamas   If you choose to wear lotion, please use ONLY the CHG-compatible lotions that are listed below.  Additional instructions for the day of surgery:  If you choose, you may shower the morning of surgery with an antibacterial soap.  DO NOT APPLY any lotions, deodorants or cologne.   Do not bring valuables to the hospital. [facility] is not responsible for any belongings/valuables. Do not wear jewelry  Put on clean/comfortable clothes.  Please brush your teeth.  Ask your nurse before applying any prescription medications to the skin.     CHG Compatible Lotions   Aveeno Moisturizing lotion  Cetaphil Moisturizing Cream  Cetaphil Moisturizing Lotion  Clairol Herbal Essence Moisturizing Lotion, Dry Skin  Clairol Herbal Essence Moisturizing Lotion, Extra Dry Skin  Clairol Herbal  Essence Moisturizing Lotion, Normal Skin  Curel Age Defying Therapeutic Moisturizing Lotion with Alpha Hydroxy  Curel Extreme Care Body Lotion  Curel Soothing Hands Moisturizing Hand Lotion  Curel Therapeutic Moisturizing Cream, Fragrance-Free  Curel Therapeutic Moisturizing Lotion, Fragrance-Free  Curel Therapeutic Moisturizing Lotion, Original Formula  Eucerin Daily Replenishing Lotion  Eucerin Dry Skin Therapy Plus Alpha Hydroxy Crme  Eucerin Dry Skin Therapy Plus Alpha Hydroxy Lotion  Eucerin Original Crme  Eucerin Original Lotion  Eucerin Plus Crme Eucerin Plus Lotion  Eucerin TriLipid Replenishing Lotion  Keri Anti-Bacterial Hand Lotion  Keri Deep Conditioning Original Lotion Dry Skin Formula Softly Scented  Keri Deep Conditioning Original Lotion, Fragrance Free Sensitive Skin Formula  Keri Lotion Fast Absorbing Fragrance Free Sensitive Skin Formula  Keri Lotion Fast Absorbing Softly Scented Dry Skin Formula  Keri Original Lotion  Keri Skin Renewal Lotion Keri Silky Smooth Lotion  Keri Silky Smooth Sensitive Skin Lotion  Nivea Body Creamy Conditioning Oil  Nivea Body Extra Enriched Lotion  Nivea Body Original Lotion  Nivea Body Sheer Moisturizing Lotion Nivea Crme  Nivea Skin Firming Lotion  NutraDerm 30 Skin Lotion  NutraDerm Skin Lotion  NutraDerm Therapeutic Skin Cream  NutraDerm Therapeutic Skin Lotion  ProShield Protective Hand Cream  Provon moisturizing lotion  Please read over the following fact sheets that you were given.

## 2024-01-19 NOTE — Progress Notes (Signed)
 This patient returns to my office for at risk foot care.  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  This patient is unable to cut nails himself since the patient cannot reach his nails.These nails are painful walking and wearing shoes. Healing from tendon release surgery. This patient presents for at risk foot care today.  General Appearance  Alert, conversant and in no acute stress.  Vascular  Dorsalis pedis and posterior tibial  pulses are palpable  bilaterally.  Capillary return is within normal limits  bilaterally. Temperature is within normal limits  bilaterally.  Neurologic  Senn-Weinstein monofilament wire test within normal limits  bilaterally. Muscle power within normal limits bilaterally.  Nails Thick disfigured discolored nails with subungual debris  from hallux to fifth toes bilaterally. No evidence of bacterial infection or drainage bilaterally.    Orthopedic  No limitations of motion  feet .  No crepitus or effusions noted.  No bony pathology or digital deformities noted.  Asymptomatic  HAV.  Limited ROM STJ right foot.  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    Skin  normotropic skin with no porokeratosis noted bilaterally.  No signs of infections or ulcers noted.     Onychomycosis  Pain in right toes  Pain in left toes  Consent was obtained for treatment procedures.   Mechanical debridement of nails 1-5  bilaterally performed with a nail nipper.  Filed with dremel without incident.   Return office visit    3 months                  Told patient to return for periodic foot care and evaluation due to potential at risk complications.   Cordella Bold DPM

## 2024-01-20 ENCOUNTER — Encounter (HOSPITAL_COMMUNITY)
Admission: RE | Admit: 2024-01-20 | Discharge: 2024-01-20 | Disposition: A | Discharge status: Home / Self Care | Source: Ambulatory Visit | Attending: Neurosurgery | Admitting: Neurosurgery

## 2024-01-20 ENCOUNTER — Ambulatory Visit (INDEPENDENT_AMBULATORY_CARE_PROVIDER_SITE_OTHER)

## 2024-01-20 ENCOUNTER — Other Ambulatory Visit: Payer: Self-pay

## 2024-01-20 ENCOUNTER — Encounter (HOSPITAL_COMMUNITY): Payer: Self-pay

## 2024-01-20 VITALS — BP 150/78 | HR 78 | Temp 98.0°F | Resp 18 | Ht 72.0 in | Wt 270.8 lb

## 2024-01-20 DIAGNOSIS — L97514 Non-pressure chronic ulcer of other part of right foot with necrosis of bone: Secondary | ICD-10-CM

## 2024-01-20 DIAGNOSIS — M5116 Intervertebral disc disorders with radiculopathy, lumbar region: Secondary | ICD-10-CM | POA: Diagnosis not present

## 2024-01-20 DIAGNOSIS — E785 Hyperlipidemia, unspecified: Secondary | ICD-10-CM | POA: Insufficient documentation

## 2024-01-20 DIAGNOSIS — Z01818 Encounter for other preprocedural examination: Secondary | ICD-10-CM | POA: Diagnosis present

## 2024-01-20 DIAGNOSIS — E1151 Type 2 diabetes mellitus with diabetic peripheral angiopathy without gangrene: Secondary | ICD-10-CM | POA: Diagnosis not present

## 2024-01-20 DIAGNOSIS — Z8546 Personal history of malignant neoplasm of prostate: Secondary | ICD-10-CM | POA: Insufficient documentation

## 2024-01-20 DIAGNOSIS — M2041 Other hammer toe(s) (acquired), right foot: Secondary | ICD-10-CM

## 2024-01-20 DIAGNOSIS — E875 Hyperkalemia: Secondary | ICD-10-CM | POA: Diagnosis not present

## 2024-01-20 DIAGNOSIS — M14671 Charcot's joint, right ankle and foot: Secondary | ICD-10-CM

## 2024-01-20 DIAGNOSIS — I10 Essential (primary) hypertension: Secondary | ICD-10-CM | POA: Insufficient documentation

## 2024-01-20 DIAGNOSIS — E1142 Type 2 diabetes mellitus with diabetic polyneuropathy: Secondary | ICD-10-CM | POA: Diagnosis not present

## 2024-01-20 DIAGNOSIS — Z9079 Acquired absence of other genital organ(s): Secondary | ICD-10-CM | POA: Insufficient documentation

## 2024-01-20 DIAGNOSIS — I4510 Unspecified right bundle-branch block: Secondary | ICD-10-CM | POA: Diagnosis not present

## 2024-01-20 DIAGNOSIS — Z87891 Personal history of nicotine dependence: Secondary | ICD-10-CM | POA: Diagnosis not present

## 2024-01-20 DIAGNOSIS — I493 Ventricular premature depolarization: Secondary | ICD-10-CM | POA: Diagnosis not present

## 2024-01-20 DIAGNOSIS — Z85828 Personal history of other malignant neoplasm of skin: Secondary | ICD-10-CM | POA: Insufficient documentation

## 2024-01-20 HISTORY — DX: Peripheral vascular disease, unspecified: I73.9

## 2024-01-20 LAB — BASIC METABOLIC PANEL WITH GFR
Anion gap: 9 (ref 5–15)
BUN: 24 mg/dL — ABNORMAL HIGH (ref 8–23)
CO2: 25 mmol/L (ref 22–32)
Calcium: 10.3 mg/dL (ref 8.9–10.3)
Chloride: 105 mmol/L (ref 98–111)
Creatinine, Ser: 1.3 mg/dL — ABNORMAL HIGH (ref 0.61–1.24)
GFR, Estimated: 58 mL/min — ABNORMAL LOW
Glucose, Bld: 93 mg/dL (ref 70–99)
Potassium: 5.5 mmol/L — ABNORMAL HIGH (ref 3.5–5.1)
Sodium: 139 mmol/L (ref 135–145)

## 2024-01-20 LAB — CBC
HCT: 34.4 % — ABNORMAL LOW (ref 39.0–52.0)
Hemoglobin: 11.7 g/dL — ABNORMAL LOW (ref 13.0–17.0)
MCH: 34.5 pg — ABNORMAL HIGH (ref 26.0–34.0)
MCHC: 34 g/dL (ref 30.0–36.0)
MCV: 101.5 fL — ABNORMAL HIGH (ref 80.0–100.0)
Platelets: 182 K/uL (ref 150–400)
RBC: 3.39 MIL/uL — ABNORMAL LOW (ref 4.22–5.81)
RDW: 13.7 % (ref 11.5–15.5)
WBC: 6.2 K/uL (ref 4.0–10.5)
nRBC: 0 % (ref 0.0–0.2)

## 2024-01-20 LAB — SURGICAL PCR SCREEN
MRSA, PCR: NEGATIVE
Staphylococcus aureus: NEGATIVE

## 2024-01-20 LAB — HEMOGLOBIN A1C
Hgb A1c MFr Bld: 5.7 % — ABNORMAL HIGH (ref 4.8–5.6)
Mean Plasma Glucose: 116.89 mg/dL

## 2024-01-20 LAB — TYPE AND SCREEN
ABO/RH(D): B POS
Antibody Screen: NEGATIVE

## 2024-01-20 LAB — GLUCOSE, CAPILLARY: Glucose-Capillary: 113 mg/dL — ABNORMAL HIGH (ref 70–99)

## 2024-01-20 NOTE — Progress Notes (Signed)
 PCP - Dr. Toribio Gander Cardiologist - denies  PPM/ICD - denies Device Orders - n/a Rep Notified -  n/a  Chest x-ray - denies EKG - 01/20/24 Stress Test - 09/25/2012 ECHO - 09/25/2012 Cardiac Cath - denies  Sleep Study - denies CPAP - n/a  Fasting Blood Sugar - 100-110 Checks Blood Sugar __1___ times a day  Last dose of GLP1 agonist-  last dose of Mounjaro was 1/4 and patient is aware not to take another dose   Blood Thinner Instructions:  patient has not received instructions for Pletal  from Dr. Gander.  Per Dr. Mavis, Pletal  would need to be held for 5 days prior to surgery.  Patient is calling Dr. Gander to confirm that Pletal  can be held for 5 days prior to surgery.   Aspirin  Instructions: pt does not take Aspirin   ERAS Protcol - NPO PRE-SURGERY Ensure or G2-  n/a  COVID TEST- n/a   Anesthesia review: yes - HTN, PVD, DM  Patient denies shortness of breath, fever, cough and chest pain at PAT appointment   All instructions explained to the patient, with a verbal understanding of the material. Patient agrees to go over the instructions while at home for a better understanding. Patient also instructed to self quarantine after being tested for COVID-19. The opportunity to ask questions was provided.

## 2024-01-20 NOTE — Progress Notes (Signed)
 "    Subjective:  Patient ID: Raymond Herring, male    DOB: 1950/02/08,  MRN: [id number]  No chief complaint on file.   74 y.o. male presents with the above complaint. He is here for follow-up of right fourth toe flexor tenotomy. He denies any complications.  He would also like to discuss the Charcot changes that have occurred in his right foot.  Review of Systems: Negative except as noted in the HPI. Denies N/V/F/Ch.  Past Medical History:  Diagnosis Date   Anxiety    Arthritis    Diabetes mellitus    Diverticulosis    Headache    Hyperlipidemia    Hypertension    Neuropathy    feet and fingers   Peripheral vascular disease    Prostate cancer (HCC)    Sarcoidosis of central nervous system    negatively affects gait   Skin cancer    Multiple areas removed. Managed by Dr. Odis Molt.   Umbilical hernia    Current Medications[1]  Tobacco Use History[2]  Allergies[3] Objective:  There were no vitals filed for this visit. There is no height or weight on file to calculate BMI.  Physical Exam: warm, good capillary refill, normal DP and PT pulses, and diffuse polyneuropathy - previous partial right third toe amputation, today the right second toe ulcer has fully healed the right fourth toe ulcer is nearly fully healed without signs of infection.  Right second toe straight without significant deformity.  - Right fourth toe s/p flexor tendon transfer has decreased hammering/pressure at distal tuft of digit. Decrease in hyperkeratotic tissue without any underlying wound right fourth toe. Incision site fully healed. No complication or sign of infection.    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  There is moderate collapse of the medial longitudinal arch.  There are not underlying bone prominences.  He does experience some pain in his ankle that he has braces for.  Left Foot: dystrophic yellowed discolored nail plates with subungual debris Right Foot:  dystrophic yellowed discolored nail plates with subungual debris   Radiographic imaging right foot: Taken and reviewed today.  Patient has Charcot arthropathy changes to the midfoot.  He has a plantigrade foot shape.  No acute osseous abnormalities or active Charcot identified.  The fourth toe is less hammered and sitting in an improved, more rectus position.  Assessment:   1. 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 Hammertoe of right foot   3. Charcot joint of right foot    Plan:  - Patient was evaluated and treated and all questions answered.  Right foot 2nd and 4th ulcerations - Discussed with the patient his right foot ulcerations.  His second toe has fully healed.  The fourth digit has now fully healed s/p flexor tenotomy 01/05/24.   - No sign of infection - RTC 4 weeks for final check  Charcot deformity, right foot - Patient I had extended discussion about his deformity to the right foot.  He currently has a plantigrade foot without any preulcerative lesions.  He does have pain to his ankle that he uses braces for with difficulty.  We discussed the involvement and risk of a Charcot reconstruction along with the necessary healing time.  The patient expresses that he is continuing to get around okay and does not feel that his quality of life is being sorely affected.   Prentice Ovens, DPM AACFAS Fellowship Trained Podiatric Surgeon [facility]      [1]  Current Outpatient Medications:    acetaminophen  (TYLENOL ) 650 MG CR tablet, Take 1,300 mg by mouth in the morning and at bedtime., Disp: , Rfl:    allopurinol  (ZYLOPRIM ) 300 MG tablet, Take 300 mg by mouth 2 (two) times daily., Disp: , Rfl: 12   Alpha-Lipoic Acid 100 MG CAPS, Take 2 a day on an empty stomach . (Patient not taking: Reported on 01/13/2024), Disp: 60 capsule, Rfl: 5   amLODipine  (NORVASC ) 2.5 MG tablet, Take 2.5 mg by mouth daily., Disp: , Rfl:    amoxicillin (AMOXIL) 500  MG tablet, Take 2,000 mg by mouth once., Disp: , Rfl:    Ascorbic Acid (VITAMIN C PO), Take 3,000 mg by mouth in the morning., Disp: , Rfl:    celecoxib  (CELEBREX ) 200 MG capsule, Take 200 mg by mouth 2 (two) times daily., Disp: , Rfl:    Cholecalciferol (DIALYVITE VITAMIN D 5000) 125 MCG (5000 UT) capsule, Take 5,000 Units by mouth at bedtime., Disp: , Rfl:    cilostazol  (PLETAL ) 100 MG tablet, Take 100 mg by mouth at bedtime., Disp: , Rfl:    clobetasol (OLUX) 0.05 % topical foam, Apply 1 application topically daily as needed (irritation). , Disp: , Rfl:    colchicine 0.6 MG tablet, Take 0.6 mg by mouth daily as needed (gout)., Disp: , Rfl:    Cyanocobalamin  (B-12) 3000 MCG CAPS, Take 3,000 mcg by mouth at bedtime., Disp: , Rfl:    cycloSPORINE (RESTASIS) 0.05 % ophthalmic emulsion, Place 1 drop into both eyes 2 (two) times daily., Disp: , Rfl:    diclofenac Sodium (VOLTAREN) 1 % GEL, Apply 1 Application topically daily., Disp: , Rfl:    fluorometholone  (FML) 0.1 % ophthalmic suspension, Place 1 drop into both eyes 2 (two) times daily., Disp: , Rfl:    gabapentin  (NEURONTIN ) 300 MG capsule, Take 300 mg by mouth 2 (two) times daily., Disp: , Rfl:    ketoconazole (NIZORAL) 2 % shampoo, Apply 1 Application topically 2 (two) times a week., Disp: , Rfl:    Lancets (ONETOUCH DELICA PLUS LANCET33G) MISC, SMARTSIG:Topical 2-4 Times Daily, Disp: , Rfl:    Multiple Vitamin (MULTIVITAMIN WITH MINERALS) TABS tablet, Take 1 tablet by mouth in the morning. Centrum, Disp: , Rfl:    mupirocin  ointment (BACTROBAN ) 2 %, Apply 1 Application topically daily. (Patient taking differently: Apply 1 Application topically daily as needed (toe infection).), Disp: 30 g, Rfl: 2   olmesartan (BENICAR) 20 MG tablet, Take 20 mg by mouth in the morning., Disp: , Rfl:    ONE TOUCH ULTRA TEST test strip, , Disp: , Rfl:    pravastatin  (PRAVACHOL ) 20 MG tablet, Take 20 mg by mouth daily., Disp: , Rfl:    saccharomyces boulardii  (FLORASTOR) 250 MG capsule, Take 250 mg by mouth 2 (two) times daily., Disp: , Rfl:    tirzepatide (MOUNJARO) 10 MG/0.5ML Pen, Inject 10 mg into the skin once a week., Disp: , Rfl:    Vibegron (GEMTESA) 75 MG TABS, Take 75 mg by mouth at bedtime., Disp: , Rfl:    zinc gluconate 50 MG tablet, Take 50 mg by mouth at bedtime., Disp: , Rfl:    zolpidem  (AMBIEN ) 10 MG tablet, Take 10 mg by mouth at bedtime as needed for sleep. , Disp: , Rfl:  [2]  Social History Tobacco Use  Smoking Status Former   Current packs/day: 0.00   Average packs/day: 2.0 packs/day for 25.0 years (  50.0 ttl pk-yrs)   Types: Cigarettes   Start date: 01/25/1956   Quit date: 01/24/1981   Years since quitting: 43.0  Smokeless Tobacco Never  [3] No Known Allergies  "

## 2024-01-20 NOTE — Progress Notes (Addendum)
 Surgical Instructions     Your procedure is scheduled on Thursday January 29, 2024. Report to [facility] Main Entrance A at 5:30 A.M., then check in with the Admitting office. Any questions or running late day of surgery: call [phone]   Questions prior to your surgery date: call [phone], Monday-Friday, 8am-4pm. If you experience any cold or flu symptoms such as cough, fever, chills, shortness of breath, etc. between now and your scheduled surgery, please notify us  at the above number.            Remember:       Do not eat or drink after midnight the night before your surgery   Take these medicines the morning of surgery with A SIP OF WATER   acetaminophen  (TYLENOL )  allopurinol  (ZYLOPRIM )  amLODipine  (NORVASC )  cycloSPORINE (RESTASIS) 0.05 % ophthalmic emulsion  fluorometholone  (FML) 0.1 % ophthalmic suspension  gabapentin  (NEURONTIN )  pravastatin  (PRAVACHOL )      May take these medicines IF NEEDED: colchicine      PER YOUR SURGEON'S INSTRUCTIONS, PLEASE HOLD YOUR celecoxib  (CELEBREX ) FOR 5 DAYS PRIOR TO SURGERY WITH THE LAST DOSE BEING 01/23/2024.     Follow your prescribing doctor's instructions on when to stop your Cilostazol  (Pletal ).  If no instructions received, reach out to your prescribing doctor's office.     One week prior to surgery, STOP taking any Aspirin  (unless otherwise instructed by your surgeon) Aleve, Naproxen, Ibuprofen, Motrin, Advil, Goody's, BC's, all herbal medications, fish oil, and non-prescription vitamins.  This includes your diclofenac Sodium (VOLTAREN) 1 % GEL.       WHAT DO I DO ABOUT MY DIABETES MEDICATION?           DO NOT USE YOUR tirzepatide (MOUNJARO) 7 DAYS PRIOR TO SURGERY WITH THE LAST DOSE BEING NO LATER THAN 01/21/2024.         HOW TO MANAGE YOUR DIABETES BEFORE AND AFTER SURGERY   Why is it important to control my blood sugar before and after surgery? Improving blood sugar levels before and after surgery helps healing  and can limit problems. A way of improving blood sugar control is eating a healthy diet by:  Eating less sugar and carbohydrates  Increasing activity/exercise  Talking with your doctor about reaching your blood sugar goals High blood sugars (greater than 180 mg/dL) can raise your risk of infections and slow your recovery, so you will need to focus on controlling your diabetes during the weeks before surgery. Make sure that the doctor who takes care of your diabetes knows about your planned surgery including the date and location.   How do I manage my blood sugar before surgery? Check your blood sugar at least 4 times a day, starting 2 days before surgery, to make sure that the level is not too high or low.   Check your blood sugar the morning of your surgery when you wake up and every 2 hours until you get to the Short Stay unit.   If your blood sugar is less than 70 mg/dL, you will need to treat for low blood sugar: Do not take insulin . Treat a low blood sugar (less than 70 mg/dL) with  cup of clear juice (cranberry or apple), 4 glucose tablets, OR glucose gel. Recheck blood sugar in 15 minutes after treatment (to make sure it is greater than 70 mg/dL). If your blood sugar is not greater than 70 mg/dL on recheck, call [phone] for further instructions. Report your blood sugar to the short stay nurse  when you get to Short Stay.   If you are admitted to the hospital after surgery: Your blood sugar will be checked by the staff and you will probably be given insulin  after surgery (instead of oral diabetes medicines) to make sure you have good blood sugar levels. The goal for blood sugar control after surgery is 80-180 mg/dL.                       Do NOT Smoke (Tobacco/Vaping) for 24 hours prior to your procedure.   If you use a CPAP at night, you may bring your mask/headgear for your overnight stay.   You will be asked to remove any contacts, glasses, piercing's, hearing aid's,  dentures/partials prior to surgery. Please bring cases for these items if needed.    Patients discharged the day of surgery will not be allowed to drive home, and someone needs to stay with them for 24 hours.   SURGICAL WAITING ROOM VISITATION Patients may have no more than 2 support people in the waiting area - these visitors may rotate.   Pre-op nurse will coordinate an appropriate time for 1 ADULT support person, who may not rotate, to accompany patient in pre-op.  Children under the age of 25 must have an adult with them who is not the patient and must remain in the main waiting area with an adult.   If the patient needs to stay at the hospital during part of their recovery, the visitor guidelines for inpatient rooms apply.   Please refer to the [facility] website for the visitor guidelines for any additional information.     If you received a COVID test during your pre-op visit  it is requested that you wear a mask when out in public, stay away from anyone that may not be feeling well and notify your surgeon if you develop symptoms. If you have been in contact with anyone that has tested positive in the last 10 days please notify you surgeon.         Pre-operative 4 CHG Bathing Instructions    You can play a key role in reducing the risk of infection after surgery. Your skin needs to be as free of germs as possible. You can reduce the number of germs on your skin by washing with CHG (chlorhexidine  gluconate) soap before surgery. CHG is an antiseptic soap that kills germs and continues to kill germs even after washing.    DO NOT use if you have an allergy to chlorhexidine /CHG or antibacterial soaps. If your skin becomes reddened or irritated, stop using the CHG and notify one of our RNs at [phone].    Please shower with the CHG soap starting 4 days before surgery using the following schedule:       Please keep in mind the following:  You may shave your face at any point  before/day of surgery.  Place clean sheets on your bed the day you start using CHG soap. Use a clean washcloth (not used since being washed) for each shower. DO NOT sleep with pets once you start using the CHG.    CHG Shower Instructions:  Wash your face and private area with normal soap. If you choose to wash your hair, wash first with your normal shampoo.  After you use shampoo/soap, rinse your hair and body thoroughly to remove shampoo/soap residue.  Turn the water  OFF and apply  bottle of CHG soap to a CLEAN washcloth.  Apply CHG  soap ONLY FROM YOUR NECK DOWN TO YOUR TOES (washing for 3-5 minutes)  DO NOT use CHG soap on face, private areas, open wounds, or sores.  Pay special attention to the area where your surgery is being performed.  If you are having back surgery, having someone wash your back for you may be helpful. Wait 2 minutes after CHG soap is applied, then you may rinse off the CHG soap.  Pat dry with a clean towel  Put on clean clothes/pajamas   If you choose to wear lotion, please use ONLY the CHG-compatible lotions that are listed below.   Additional instructions for the day of surgery:   If you choose, you may shower the morning of surgery with an antibacterial soap.  DO NOT APPLY any lotions, deodorants or cologne.   Do not bring valuables to the hospital. [facility] is not responsible for any belongings/valuables. Do not wear jewelry  Put on clean/comfortable clothes.  Please brush your teeth.  Ask your nurse before applying any prescription medications to the skin.        CHG Compatible Lotions    Aveeno Moisturizing lotion  Cetaphil Moisturizing Cream  Cetaphil Moisturizing Lotion  Clairol Herbal Essence Moisturizing Lotion, Dry Skin  Clairol Herbal Essence Moisturizing Lotion, Extra Dry Skin  Clairol Herbal Essence Moisturizing Lotion, Normal Skin  Curel Age Defying Therapeutic Moisturizing Lotion with Alpha Hydroxy  Curel Extreme Care Body Lotion   Curel Soothing Hands Moisturizing Hand Lotion  Curel Therapeutic Moisturizing Cream, Fragrance-Free  Curel Therapeutic Moisturizing Lotion, Fragrance-Free  Curel Therapeutic Moisturizing Lotion, Original Formula  Eucerin Daily Replenishing Lotion  Eucerin Dry Skin Therapy Plus Alpha Hydroxy Crme  Eucerin Dry Skin Therapy Plus Alpha Hydroxy Lotion  Eucerin Original Crme  Eucerin Original Lotion  Eucerin Plus Crme Eucerin Plus Lotion  Eucerin TriLipid Replenishing Lotion  Keri Anti-Bacterial Hand Lotion  Keri Deep Conditioning Original Lotion Dry Skin Formula Softly Scented  Keri Deep Conditioning Original Lotion, Fragrance Free Sensitive Skin Formula  Keri Lotion Fast Absorbing Fragrance Free Sensitive Skin Formula  Keri Lotion Fast Absorbing Softly Scented Dry Skin Formula  Keri Original Lotion  Keri Skin Renewal Lotion Keri Silky Smooth Lotion  Keri Silky Smooth Sensitive Skin Lotion  Nivea Body Creamy Conditioning Oil  Nivea Body Extra Enriched Lotion  Nivea Body Original Lotion  Nivea Body Sheer Moisturizing Lotion Nivea Crme  Nivea Skin Firming Lotion  NutraDerm 30 Skin Lotion  NutraDerm Skin Lotion  NutraDerm Therapeutic Skin Cream  NutraDerm Therapeutic Skin Lotion  ProShield Protective Hand Cream  Provon moisturizing lotion   Please read over the following fact sheets that you were given.

## 2024-01-20 NOTE — Progress Notes (Signed)
 Notified Nikki at Dr. Mavis' office of patient's potassium.

## 2024-01-21 ENCOUNTER — Other Ambulatory Visit: Payer: Self-pay | Admitting: Neurosurgery

## 2024-01-22 NOTE — Anesthesia Preprocedure Evaluation (Signed)
"                                    Anesthesia Evaluation  Patient identified by MRN, date of birth, ID band Patient awake    Reviewed: Allergy & Precautions, NPO status , Patient's Chart, lab work & pertinent test results  History of Anesthesia Complications Negative for: history of anesthetic complications  Airway Mallampati: II  TM Distance: >3 FB Neck ROM: Full    Dental  (+) Dental Advisory Given   Pulmonary former smoker   breath sounds clear to auscultation       Cardiovascular hypertension, Pt. on medications + Peripheral Vascular Disease   Rhythm:Regular Rate:Normal     Neuro/Psych  Headaches  Anxiety        GI/Hepatic negative GI ROS, Neg liver ROS,,,  Endo/Other  diabetes (glu 123)  Mounjaro: 01/18/2024 BMI 37  Renal/GU Renal InsufficiencyRenal disease     Musculoskeletal   Abdominal   Peds  Hematology Hb 11.7, plt 182k   Anesthesia Other Findings H/o prostate cancer  Reproductive/Obstetrics                              Anesthesia Physical Anesthesia Plan  ASA: 3  Anesthesia Plan: General   Post-op Pain Management: Tylenol  PO (pre-op)*   Induction: Intravenous  PONV Risk Score and Plan: 2 and Ondansetron  and Dexamethasone   Airway Management Planned: Oral ETT  Additional Equipment: None  Intra-op Plan:   Post-operative Plan: Extubation in OR  Informed Consent: I have reviewed the patients History and Physical, chart, labs and discussed the procedure including the risks, benefits and alternatives for the proposed anesthesia with the patient or authorized representative who has indicated his/her understanding and acceptance.   Patient has DNR.  Discussed DNR with patient and Suspend DNR.   Dental advisory given  Plan Discussed with: CRNA and Surgeon  Anesthesia Plan Comments: (PAT note written 01/22/2024 by Addelynn Batte, PA-C. Pt is DNR, adamant about restarting in PACU, and early extubation )          Anesthesia Quick Evaluation  "

## 2024-01-22 NOTE — Progress Notes (Signed)
 Anesthesia Chart Review:  Case: [id number] Date/Time: 01/29/24 0715   Procedure: POSTERIOR LUMBAR FUSION 1 LEVEL - PLIF, interbody prosthesis, posterior instrumentation - L2-L3 - with exploration of previous fusion at L3-4, L4-5   Anesthesia type: General   Diagnosis: Lumbar radiculopathy, chronic [M54.16]   Pre-op diagnosis: LUMBAR RADICULOPATHY, CHRONIC   [facility]: [facility]OM [facility] / [facility]   Surgeons: Mavis Purchase, MD       DISCUSSION: Patient is a 74 year old male scheduled for the above procedure.  History includes former smoker (quit 1983), HTN, HLD, DM2 (peripheral neuropathy), PVD, prostate cancer (s/p robot-assisted laparoscopic radical prostatectomy, pelvic lymphadenectomy 06/17/2018; s/p radiotherapy), skin cancer, osteoarthritis (right TKA 01/30/2011; left TKA 02/01/2016; left reverse TSA 02/02/2019), spinal surgery (L2-4 TLIF/posterolateral fusion 01/17/2022), ventral hernia. 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 BMI is consistent with class 2 obesity.   PCP Dr. Jakie classified patient as low risk for planned surgery.  He gave permission to hold aspirin  7 days prior to surgery if needed.  He has been considered for a sleep study, but I don't see that he has had one yet.   Last Mounjaro 01/18/2024 and will hold until after surgery.   Dr. Mavis advised that he hold Pletal  for 5 days prior to surgery.  He is not currently taking aspirin .  Anesthesia team to evaluate on the day of surgery. Will check an iSTAT on arrival since K 5.5 (lab trends suggest high normal/mildly elevated potassium results over the past five years). He is on an ARB.    VS: BP (!) 150/78   Pulse 78   Temp 36.7 C   Resp 18   Ht 6' (1.829 m)   Wt 122.8 kg   SpO2 97%   BMI 36.73 kg/m    PROVIDERS: Yolande Toribio MATSU, MD is PCP at [facility] Dohmeier, Dedra, MD is neurologist   LABS: Preoperative labs noted. K 5.5 (no mention  of hemolysis). PAT RN notified Dr. Mavis' staff. He is on a ARB. Creatinine 1.3 (K 4.8, Cr 1.2 on 04/15/2023 at [facility]). (all labs ordered are listed, but only abnormal results are displayed)  Labs Reviewed  GLUCOSE, CAPILLARY - Abnormal; Notable for the following components:      Result Value   Glucose-Capillary 113 (*)    All other components within normal limits  HEMOGLOBIN A1C - Abnormal; Notable for the following components:   Hgb A1c MFr Bld 5.7 (*)    All other components within normal limits  BASIC METABOLIC PANEL WITH GFR - Abnormal; Notable for the following components:   Potassium 5.5 (*)    BUN 24 (*)    Creatinine, Ser 1.30 (*)    GFR, Estimated 58 (*)    All other components within normal limits  CBC - Abnormal; Notable for the following components:   RBC 3.39 (*)    Hemoglobin 11.7 (*)    HCT 34.4 (*)    MCV 101.5 (*)    MCH 34.5 (*)    All other components within normal limits  SURGICAL PCR SCREEN  TYPE AND SCREEN    IMAGES: MRI L-spine 07/24/2023: IMPRESSION: 1. Mild-to-moderate canal stenoses at T2-T3, T3-T4, and T5-T6 secondary to disc bulging and facet arthropathy. Moderate foraminal stenoses bilaterally at T2-T3 and T3-T4. 2. Severe canal stenosis at L1-L2 secondary to disc bulging and facet arthropathy. 3. Severe left foraminal stenosis at L2-L3. Moderate foraminal stenoses bilaterally at L1-L2, on the right at  L2-L3, and bilaterally at L3-L4. 4. Posterior decompression at L2-L3 with posterior fusion from L2 through L4. Interbody fusion at L2-L3 and L3-L4.   MRI T-spine 07/24/2023: IMPRESSION: 1. Mild-to-moderate canal stenoses at T2-T3, T3-T4, and T5-T6 secondary to disc bulging and facet arthropathy. Moderate foraminal stenoses bilaterally at T2-T3 and T3-T4. 2. Severe canal stenosis at L1-L2 secondary to disc bulging and facet arthropathy. 3. Severe left foraminal stenosis at L2-L3. Moderate foraminal stenoses bilaterally at L1-L2, on the right at  L2-L3, and bilaterally at L3-L4. 4. Posterior decompression at L2-L3 with posterior fusion from L2 through L4. Interbody fusion at L2-L3 and L3-L4.   EKG: Sinus rhythm with occasional Premature ventricular complexes  Right bundle branch block, old Abnormal ECG  When compared with ECG of 03-Jan-2022 08:43, PVCs are new  Confirmed by Sheena Pugh [phone]) on 01/20/2024 10:00:28 PM Referred by: TORIBIO PATERSON Confirmed By: Ramona   CV: Stress echo 09/25/2012: Study Conclusions  - Stress ECG conclusions: No ST changes to suggest ischemia.    4 beats NSVT in immed recovery period.  - Immediate post stress: LV global systolic function was    hyperdynamic. No evidence for new LV regional wall motion    abnormalities.  Impressions:  - Normal study after maximal exercise.    Past Medical History:  Diagnosis Date   Anxiety    Arthritis    Diabetes mellitus    Diverticulosis    Headache    Hyperlipidemia    Hypertension    Neuropathy    feet and fingers   Peripheral vascular disease    Prostate cancer (HCC)    Sarcoidosis of central nervous system    negatively affects gait   Skin cancer    Multiple areas removed. Managed by Dr. Odis Molt.   Umbilical hernia     Past Surgical History:  Procedure Laterality Date   APPENDECTOMY     CATARACT EXTRACTION Bilateral    KNEE CARTILAGE SURGERY Bilateral 8031,8021   right and left knee   LAPAROSCOPIC APPENDECTOMY  2014   LYMPHADENECTOMY Bilateral 06/17/2018   Procedure: LYMPHADENECTOMY;  Surgeon: Alvaro Hummer, MD;  Location: [facility];  Service: Urology;  Laterality: Bilateral;   REVERSE SHOULDER ARTHROPLASTY Left 02/02/2019   Procedure: REVERSE SHOULDER ARTHROPLASTY;  Surgeon: Sharl Selinda Dover, MD;  Location: [facility] OR;  Service: Orthopedics;  Laterality: Left;  2.5hrs   ROBOT ASSISTED LAPAROSCOPIC RADICAL PROSTATECTOMY N/A 06/17/2018   Procedure: XI ROBOTIC ASSISTED LAPAROSCOPIC RADICAL PROSTATECTOMY;  Surgeon: Alvaro Hummer, MD;   Location: [facility];  Service: Urology;  Laterality: N/A;  3 HRS   TOTAL KNEE ARTHROPLASTY  01/30/2011   Procedure: TOTAL KNEE ARTHROPLASTY;  Surgeon: Tanda DELENA Heading, MD;  Location: [facility];  Service: Orthopedics;  Laterality: Right;   TOTAL KNEE ARTHROPLASTY Left 01/31/2016   Procedure: LEFT TOTAL KNEE ARTHROPLASTY;  Surgeon: Tanda Heading, MD;  Location: [facility];  Service: Orthopedics;  Laterality: Left;   TRANSFORAMINAL LUMBAR INTERBODY FUSION (TLIF) WITH PEDICLE SCREW FIXATION 2 LEVEL N/A 01/17/2022   Procedure: L2-3 L3-4 Open Laminectomy/TLIF/posterolateral fusion;  Surgeon: Cheryle Debby DELENA, MD;  Location: [facility];  Service: Neurosurgery;  Laterality: N/A;    MEDICATIONS:  acetaminophen  (TYLENOL ) 650 MG CR tablet   allopurinol  (ZYLOPRIM ) 300 MG tablet   Alpha-Lipoic Acid 100 MG CAPS   amLODipine  (NORVASC ) 2.5 MG tablet   amoxicillin (AMOXIL) 500 MG tablet   Ascorbic Acid (VITAMIN C PO)   celecoxib  (CELEBREX ) 200 MG capsule   Cholecalciferol (DIALYVITE VITAMIN D 5000) 125 MCG (  5000 UT) capsule   cilostazol  (PLETAL ) 100 MG tablet   clobetasol (OLUX) 0.05 % topical foam   colchicine 0.6 MG tablet   Cyanocobalamin  (B-12) 3000 MCG CAPS   cycloSPORINE  (RESTASIS ) 0.05 % ophthalmic emulsion   diclofenac Sodium (VOLTAREN) 1 % GEL   fluorometholone  (FML) 0.1 % ophthalmic suspension   gabapentin  (NEURONTIN ) 300 MG capsule   ketoconazole (NIZORAL) 2 % shampoo   Lancets (ONETOUCH DELICA PLUS LANCET33G) MISC   Multiple Vitamin (MULTIVITAMIN WITH MINERALS) TABS tablet   mupirocin  ointment (BACTROBAN ) 2 %   olmesartan (BENICAR) 20 MG tablet   ONE TOUCH ULTRA TEST test strip   pravastatin  (PRAVACHOL ) 20 MG tablet   saccharomyces boulardii (FLORASTOR) 250 MG capsule   tirzepatide (MOUNJARO) 10 MG/0.5ML Pen   Vibegron (GEMTESA) 75 MG TABS   zinc  gluconate 50 MG tablet   zolpidem  (AMBIEN ) 10 MG tablet   No current facility-administered medications for this encounter.  Amoxicillin is  predental.   Isaiah Ruder, PA-C Surgical Short Stay/Anesthesiology [facility] Phone [phone] [facility] Phone [phone] 01/22/2024 3:33 PM

## 2024-01-29 ENCOUNTER — Ambulatory Visit (HOSPITAL_COMMUNITY)

## 2024-01-29 ENCOUNTER — Ambulatory Visit (HOSPITAL_COMMUNITY)
Admission: RE | Admit: 2024-01-29 | Discharge: 2024-01-30 | Disposition: A | Discharge status: Home / Self Care | Attending: Neurosurgery | Admitting: Neurosurgery

## 2024-01-29 ENCOUNTER — Encounter (HOSPITAL_COMMUNITY): Admission: RE | Payer: Self-pay | Source: Home / Self Care

## 2024-01-29 ENCOUNTER — Ambulatory Visit (HOSPITAL_BASED_OUTPATIENT_CLINIC_OR_DEPARTMENT_OTHER): Payer: Self-pay | Admitting: Vascular Surgery

## 2024-01-29 ENCOUNTER — Ambulatory Visit (HOSPITAL_COMMUNITY): Payer: Self-pay | Admitting: Vascular Surgery

## 2024-01-29 DIAGNOSIS — Z79899 Other long term (current) drug therapy: Secondary | ICD-10-CM | POA: Insufficient documentation

## 2024-01-29 DIAGNOSIS — M5116 Intervertebral disc disorders with radiculopathy, lumbar region: Secondary | ICD-10-CM | POA: Insufficient documentation

## 2024-01-29 DIAGNOSIS — Z7985 Long-term (current) use of injectable non-insulin antidiabetic drugs: Secondary | ICD-10-CM | POA: Diagnosis not present

## 2024-01-29 DIAGNOSIS — E1151 Type 2 diabetes mellitus with diabetic peripheral angiopathy without gangrene: Secondary | ICD-10-CM | POA: Diagnosis not present

## 2024-01-29 DIAGNOSIS — M4316 Spondylolisthesis, lumbar region: Secondary | ICD-10-CM | POA: Insufficient documentation

## 2024-01-29 DIAGNOSIS — F419 Anxiety disorder, unspecified: Secondary | ICD-10-CM | POA: Diagnosis not present

## 2024-01-29 DIAGNOSIS — M48062 Spinal stenosis, lumbar region with neurogenic claudication: Secondary | ICD-10-CM | POA: Insufficient documentation

## 2024-01-29 DIAGNOSIS — Z87891 Personal history of nicotine dependence: Secondary | ICD-10-CM

## 2024-01-29 DIAGNOSIS — Z01818 Encounter for other preprocedural examination: Secondary | ICD-10-CM

## 2024-01-29 DIAGNOSIS — M960 Pseudarthrosis after fusion or arthrodesis: Secondary | ICD-10-CM | POA: Insufficient documentation

## 2024-01-29 DIAGNOSIS — E875 Hyperkalemia: Secondary | ICD-10-CM

## 2024-01-29 DIAGNOSIS — I10 Essential (primary) hypertension: Secondary | ICD-10-CM | POA: Diagnosis not present

## 2024-01-29 DIAGNOSIS — M5416 Radiculopathy, lumbar region: Secondary | ICD-10-CM | POA: Diagnosis present

## 2024-01-29 LAB — GLUCOSE, CAPILLARY
Glucose-Capillary: 123 mg/dL — ABNORMAL HIGH (ref 70–99)
Glucose-Capillary: 131 mg/dL — ABNORMAL HIGH (ref 70–99)
Glucose-Capillary: 143 mg/dL — ABNORMAL HIGH (ref 70–99)
Glucose-Capillary: 185 mg/dL — ABNORMAL HIGH (ref 70–99)
Glucose-Capillary: 165 mg/dL — ABNORMAL HIGH (ref 70–99)
Glucose-Capillary: 181 mg/dL — ABNORMAL HIGH (ref 70–99)

## 2024-01-29 LAB — POCT I-STAT, CHEM 8
BUN: 26 mg/dL — ABNORMAL HIGH (ref 8–23)
Calcium, Ion: 1.28 mmol/L (ref 1.15–1.40)
Chloride: 106 mmol/L (ref 98–111)
Creatinine, Ser: 1.3 mg/dL — ABNORMAL HIGH (ref 0.61–1.24)
Glucose, Bld: 115 mg/dL — ABNORMAL HIGH (ref 70–99)
HCT: 36 % — ABNORMAL LOW (ref 39.0–52.0)
Hemoglobin: 12.2 g/dL — ABNORMAL LOW (ref 13.0–17.0)
Potassium: 4.6 mmol/L (ref 3.5–5.1)
Sodium: 140 mmol/L (ref 135–145)
TCO2: 23 mmol/L (ref 22–32)

## 2024-01-29 LAB — POTASSIUM: Potassium: 4.5 mmol/L (ref 3.5–5.1)

## 2024-01-29 MED ORDER — PROPOFOL 10 MG/ML IV BOLUS
INTRAVENOUS | Status: AC
  Filled 2024-01-29: qty 20

## 2024-01-29 MED ORDER — KETOROLAC TROMETHAMINE 30 MG/ML IJ SOLN
INTRAMUSCULAR | Status: AC
  Filled 2024-01-29: qty 1

## 2024-01-29 MED ORDER — BUPIVACAINE LIPOSOME 1.3 % IJ SUSP
INTRAMUSCULAR | Status: DC | PRN
  Administered 2024-01-29: 20 mL

## 2024-01-29 MED ORDER — OXYCODONE HCL 5 MG/5ML PO SOLN: 5.0000 mg | Freq: Once | ORAL | Status: DC | PRN

## 2024-01-29 MED ORDER — CYCLOBENZAPRINE HCL 10 MG PO TABS
10.0000 mg | ORAL_TABLET | Freq: Three times a day (TID) | ORAL | Status: DC | PRN
  Administered 2024-01-29 – 2024-01-30 (×2): 10 mg via ORAL
  Filled 2024-01-29 (×2): qty 1

## 2024-01-29 MED ORDER — PROPOFOL 10 MG/ML IV BOLUS
INTRAVENOUS | Status: DC | PRN
  Administered 2024-01-29 (×2): 20 mg via INTRAVENOUS
  Administered 2024-01-29: 200 mg via INTRAVENOUS

## 2024-01-29 MED ORDER — PROPOFOL 1000 MG/100ML IV EMUL
INTRAVENOUS | Status: AC
  Filled 2024-01-29: qty 100

## 2024-01-29 MED ORDER — ONDANSETRON HCL 4 MG/2ML IJ SOLN
INTRAMUSCULAR | Status: AC
  Filled 2024-01-29: qty 2

## 2024-01-29 MED ORDER — ONDANSETRON HCL 4 MG PO TABS: 4.0000 mg | ORAL_TABLET | Freq: Four times a day (QID) | ORAL | Status: DC | PRN

## 2024-01-29 MED ORDER — BUPIVACAINE LIPOSOME 1.3 % IJ SUSP
INTRAMUSCULAR | Status: AC
  Filled 2024-01-29: qty 20

## 2024-01-29 MED ORDER — THROMBIN 5000 UNITS EX KIT
PACK | CUTANEOUS | Status: AC
  Filled 2024-01-29: qty 1

## 2024-01-29 MED ORDER — PHENYLEPHRINE 80 MCG/ML (10ML) SYRINGE FOR IV PUSH (FOR BLOOD PRESSURE SUPPORT)
PREFILLED_SYRINGE | INTRAVENOUS | Status: DC | PRN
  Administered 2024-01-29: 160 ug via INTRAVENOUS
  Administered 2024-01-29: 80 ug via INTRAVENOUS

## 2024-01-29 MED ORDER — PHENYLEPHRINE HCL-NACL 20-0.9 MG/250ML-% IV SOLN
INTRAVENOUS | Status: DC | PRN
  Administered 2024-01-29: 30 ug/min via INTRAVENOUS

## 2024-01-29 MED ORDER — ONDANSETRON HCL 4 MG/2ML IJ SOLN: 4.0000 mg | Freq: Four times a day (QID) | INTRAMUSCULAR | Status: DC | PRN

## 2024-01-29 MED ORDER — MIDAZOLAM HCL (PF) 2 MG/2ML IJ SOLN
INTRAMUSCULAR | Status: DC | PRN
  Administered 2024-01-29: 2 mg via INTRAVENOUS

## 2024-01-29 MED ORDER — CYCLOSPORINE 0.05 % OP EMUL
1.0000 [drp] | Freq: Two times a day (BID) | OPHTHALMIC | Status: DC
  Administered 2024-01-29 – 2024-01-30 (×2): 1 [drp] via OPHTHALMIC
  Filled 2024-01-29 (×2): qty 1

## 2024-01-29 MED ORDER — CHLORHEXIDINE GLUCONATE CLOTH 2 % EX PADS: 6.0000 | MEDICATED_PAD | Freq: Once | CUTANEOUS | Status: DC

## 2024-01-29 MED ORDER — ROCURONIUM BROMIDE 10 MG/ML (PF) SYRINGE
PREFILLED_SYRINGE | INTRAVENOUS | Status: DC | PRN
  Administered 2024-01-29 (×2): 20 mg via INTRAVENOUS
  Administered 2024-01-29: 70 mg via INTRAVENOUS

## 2024-01-29 MED ORDER — BACITRACIN ZINC 500 UNIT/GM EX OINT
TOPICAL_OINTMENT | CUTANEOUS | Status: AC
  Filled 2024-01-29: qty 28.35

## 2024-01-29 MED ORDER — EPHEDRINE SULFATE-NACL 50-0.9 MG/10ML-% IV SOSY
PREFILLED_SYRINGE | INTRAVENOUS | Status: DC | PRN
  Administered 2024-01-29: 2.5 mg via INTRAVENOUS
  Administered 2024-01-29 (×4): 5 mg via INTRAVENOUS

## 2024-01-29 MED ORDER — LACTATED RINGERS IV SOLN: INTRAVENOUS | Status: DC

## 2024-01-29 MED ORDER — HYDROMORPHONE HCL 1 MG/ML IJ SOLN: 0.2500 mg | INTRAMUSCULAR | Status: DC | PRN

## 2024-01-29 MED ORDER — VASHE WOUND IRRIGATION OPTIME
TOPICAL | Status: DC | PRN
  Administered 2024-01-29: 34 [oz_av] via TOPICAL

## 2024-01-29 MED ORDER — IRBESARTAN 150 MG PO TABS
150.0000 mg | ORAL_TABLET | Freq: Every day | ORAL | Status: DC
  Administered 2024-01-29: 150 mg via ORAL
  Filled 2024-01-29: qty 1

## 2024-01-29 MED ORDER — THROMBIN 5000 UNITS EX SOLR
OROMUCOSAL | Status: DC | PRN
  Administered 2024-01-29 (×2): 5 mL via TOPICAL

## 2024-01-29 MED ORDER — AMLODIPINE BESYLATE 5 MG PO TABS: 2.5000 mg | ORAL_TABLET | Freq: Every day | ORAL | Status: DC

## 2024-01-29 MED ORDER — SUGAMMADEX SODIUM 200 MG/2ML IV SOLN
INTRAVENOUS | Status: DC | PRN
  Administered 2024-01-29: 200 mg via INTRAVENOUS

## 2024-01-29 MED ORDER — DEXMEDETOMIDINE HCL IN NACL 80 MCG/20ML IV SOLN
INTRAVENOUS | Status: DC | PRN
  Administered 2024-01-29: 8 ug via INTRAVENOUS
  Administered 2024-01-29: 4 ug via INTRAVENOUS

## 2024-01-29 MED ORDER — ACETAMINOPHEN 500 MG PO TABS
1000.0000 mg | ORAL_TABLET | Freq: Four times a day (QID) | ORAL | Status: DC
  Administered 2024-01-29 – 2024-01-30 (×3): 1000 mg via ORAL
  Filled 2024-01-29 (×3): qty 2

## 2024-01-29 MED ORDER — ACETAMINOPHEN 650 MG RE SUPP: 650.0000 mg | RECTAL | Status: DC | PRN

## 2024-01-29 MED ORDER — ACETAMINOPHEN 500 MG PO TABS
1000.0000 mg | ORAL_TABLET | Freq: Once | ORAL | Status: DC
  Filled 2024-01-29: qty 2

## 2024-01-29 MED ORDER — OXYCODONE HCL 5 MG PO TABS
10.0000 mg | ORAL_TABLET | ORAL | Status: DC | PRN
  Administered 2024-01-29 – 2024-01-30 (×4): 10 mg via ORAL
  Filled 2024-01-29 (×4): qty 2

## 2024-01-29 MED ORDER — PRAVASTATIN SODIUM 10 MG PO TABS: 20.0000 mg | ORAL_TABLET | Freq: Every day | ORAL | Status: DC

## 2024-01-29 MED ORDER — DOCUSATE SODIUM 100 MG PO CAPS
100.0000 mg | ORAL_CAPSULE | Freq: Two times a day (BID) | ORAL | Status: DC
  Administered 2024-01-29: 100 mg via ORAL
  Filled 2024-01-29: qty 1

## 2024-01-29 MED ORDER — DEXAMETHASONE SOD PHOSPHATE PF 10 MG/ML IJ SOLN
INTRAMUSCULAR | Status: DC | PRN
  Administered 2024-01-29: 10 mg via INTRAVENOUS

## 2024-01-29 MED ORDER — OXYCODONE HCL 5 MG PO TABS: 5.0000 mg | ORAL_TABLET | Freq: Once | ORAL | Status: DC | PRN

## 2024-01-29 MED ORDER — BISACODYL 10 MG RE SUPP: 10.0000 mg | Freq: Every day | RECTAL | Status: DC | PRN

## 2024-01-29 MED ORDER — MENTHOL 3 MG MT LOZG: 1.0000 | LOZENGE | OROMUCOSAL | Status: DC | PRN

## 2024-01-29 MED ORDER — ORAL CARE MOUTH RINSE: 15.0000 mL | Freq: Once | OROMUCOSAL | Status: AC

## 2024-01-29 MED ORDER — MIDAZOLAM HCL (PF) 2 MG/2ML IJ SOLN: 0.5000 mg | Freq: Once | INTRAMUSCULAR | Status: DC | PRN

## 2024-01-29 MED ORDER — DEXMEDETOMIDINE HCL IN NACL 80 MCG/20ML IV SOLN
INTRAVENOUS | Status: AC
  Filled 2024-01-29: qty 20

## 2024-01-29 MED ORDER — FLUOROMETHOLONE 0.1 % OP SUSP
1.0000 [drp] | Freq: Two times a day (BID) | OPHTHALMIC | Status: DC
  Administered 2024-01-29: 1 [drp] via OPHTHALMIC
  Filled 2024-01-29: qty 5

## 2024-01-29 MED ORDER — PHENOL 1.4 % MT LIQD: 1.0000 | OROMUCOSAL | Status: DC | PRN

## 2024-01-29 MED ORDER — OXYCODONE HCL 5 MG PO TABS: 5.0000 mg | ORAL_TABLET | ORAL | Status: DC | PRN

## 2024-01-29 MED ORDER — ONDANSETRON HCL 4 MG/2ML IJ SOLN
INTRAMUSCULAR | Status: DC | PRN
  Administered 2024-01-29: 4 mg via INTRAVENOUS

## 2024-01-29 MED ORDER — CHLORHEXIDINE GLUCONATE 0.12 % MT SOLN
15.0000 mL | Freq: Once | OROMUCOSAL | Status: AC
  Administered 2024-01-29: 15 mL via OROMUCOSAL
  Filled 2024-01-29: qty 15

## 2024-01-29 MED ORDER — ACETAMINOPHEN 325 MG PO TABS: 650.0000 mg | ORAL_TABLET | ORAL | Status: DC | PRN

## 2024-01-29 MED ORDER — MIDAZOLAM HCL 2 MG/2ML IJ SOLN
INTRAMUSCULAR | Status: AC
  Filled 2024-01-29: qty 2

## 2024-01-29 MED ORDER — BUPIVACAINE-EPINEPHRINE (PF) 0.5% -1:200000 IJ SOLN
INTRAMUSCULAR | Status: AC
  Filled 2024-01-29: qty 30

## 2024-01-29 MED ORDER — 0.9 % SODIUM CHLORIDE (POUR BTL) OPTIME
TOPICAL | Status: DC | PRN
  Administered 2024-01-29: 1000 mL

## 2024-01-29 MED ORDER — ALLOPURINOL 300 MG PO TABS
300.0000 mg | ORAL_TABLET | Freq: Two times a day (BID) | ORAL | Status: DC
  Administered 2024-01-29: 300 mg via ORAL
  Filled 2024-01-29: qty 1

## 2024-01-29 MED ORDER — SODIUM CHLORIDE 0.9 % IV SOLN
250.0000 mL | INTRAVENOUS | Status: DC
  Administered 2024-01-29: 250 mL via INTRAVENOUS

## 2024-01-29 MED ORDER — GABAPENTIN 300 MG PO CAPS
300.0000 mg | ORAL_CAPSULE | Freq: Two times a day (BID) | ORAL | Status: DC
  Administered 2024-01-29 – 2024-01-30 (×2): 300 mg via ORAL
  Filled 2024-01-29 (×2): qty 1

## 2024-01-29 MED ORDER — FENTANYL CITRATE (PF) 250 MCG/5ML IJ SOLN
INTRAMUSCULAR | Status: DC | PRN
  Administered 2024-01-29 (×2): 50 ug via INTRAVENOUS

## 2024-01-29 MED ORDER — CHLORHEXIDINE GLUCONATE CLOTH 2 % EX PADS
6.0000 | MEDICATED_PAD | Freq: Once | CUTANEOUS | Status: AC
  Administered 2024-01-29: 6 via TOPICAL

## 2024-01-29 MED ORDER — CEFAZOLIN SODIUM-DEXTROSE 3-4 GM/150ML-% IV SOLN
3.0000 g | INTRAVENOUS | Status: AC
  Administered 2024-01-29 (×2): 3 g via INTRAVENOUS
  Filled 2024-01-29: qty 150

## 2024-01-29 MED ORDER — LIDOCAINE 2% (20 MG/ML) 5 ML SYRINGE
INTRAMUSCULAR | Status: AC
  Filled 2024-01-29: qty 5

## 2024-01-29 MED ORDER — SODIUM CHLORIDE 0.9% FLUSH: 3.0000 mL | INTRAVENOUS | Status: DC | PRN

## 2024-01-29 MED ORDER — CEFAZOLIN SODIUM-DEXTROSE 2-4 GM/100ML-% IV SOLN
2.0000 g | Freq: Three times a day (TID) | INTRAVENOUS | Status: AC
  Administered 2024-01-29 – 2024-01-30 (×2): 2 g via INTRAVENOUS
  Filled 2024-01-29 (×2): qty 100

## 2024-01-29 MED ORDER — SODIUM CHLORIDE 0.9% FLUSH
3.0000 mL | Freq: Two times a day (BID) | INTRAVENOUS | Status: DC
  Administered 2024-01-29: 3 mL via INTRAVENOUS

## 2024-01-29 MED ORDER — FENTANYL CITRATE (PF) 100 MCG/2ML IJ SOLN
INTRAMUSCULAR | Status: AC
  Filled 2024-01-29: qty 2

## 2024-01-29 MED ORDER — HYDROMORPHONE HCL 1 MG/ML IJ SOLN
INTRAMUSCULAR | Status: AC
  Filled 2024-01-29: qty 1

## 2024-01-29 MED ORDER — MORPHINE SULFATE (PF) 2 MG/ML IV SOLN: 2.0000 mg | INTRAVENOUS | Status: DC | PRN

## 2024-01-29 MED ORDER — INSULIN ASPART 100 UNIT/ML IJ SOLN: 0.0000 [IU] | INTRAMUSCULAR | Status: DC | PRN

## 2024-01-29 MED ORDER — BUPIVACAINE-EPINEPHRINE (PF) 0.5% -1:200000 IJ SOLN
INTRAMUSCULAR | Status: DC | PRN
  Administered 2024-01-29: 10 mL via PERINEURAL

## 2024-01-29 MED ORDER — GLYCOPYRROLATE PF 0.2 MG/ML IJ SOSY
PREFILLED_SYRINGE | INTRAMUSCULAR | Status: AC
  Filled 2024-01-29: qty 1

## 2024-01-29 MED ORDER — LIDOCAINE 2% (20 MG/ML) 5 ML SYRINGE
INTRAMUSCULAR | Status: DC | PRN
  Administered 2024-01-29: 20 mg via INTRAVENOUS

## 2024-01-29 MED ORDER — ROCURONIUM BROMIDE 10 MG/ML (PF) SYRINGE
PREFILLED_SYRINGE | INTRAVENOUS | Status: AC
  Filled 2024-01-29: qty 10

## 2024-01-29 MED ORDER — HYDROMORPHONE HCL 1 MG/ML IJ SOLN
INTRAMUSCULAR | Status: DC | PRN
  Administered 2024-01-29 (×2): .5 mg via INTRAVENOUS

## 2024-01-29 MED ORDER — DEXAMETHASONE SOD PHOSPHATE PF 10 MG/ML IJ SOLN
INTRAMUSCULAR | Status: AC
  Filled 2024-01-29: qty 1

## 2024-01-29 NOTE — Anesthesia Procedure Notes (Signed)
 Procedure Name: Intubation Date/Time: 01/29/2024 7:55 AM  Performed by: Mollie Olivia SAUNDERS, CRNAPre-anesthesia Checklist: Patient identified, Emergency Drugs available, Suction available and Patient being monitored Patient Re-evaluated:Patient Re-evaluated prior to induction Oxygen Delivery Method: Circle system utilized Preoxygenation: Pre-oxygenation with 100% oxygen Induction Type: IV induction Ventilation: Two handed mask ventilation required and Oral airway inserted - appropriate to patient size Laryngoscope Size: Glidescope and 4 Grade View: Grade I Tube type: Oral Tube size: 7.5 mm Number of attempts: 1 Airway Equipment and Method: Oral airway, Rigid stylet and Video-laryngoscopy Placement Confirmation: ETT inserted through vocal cords under direct vision, positive ETCO2 and breath sounds checked- equal and bilateral Secured at: 23 cm Tube secured with: Tape Dental Injury: Teeth and Oropharynx as per pre-operative assessment  Difficulty Due To: Difficulty was anticipated, Difficult Airway- due to reduced neck mobility and Difficult Airway- due to large tongue

## 2024-01-29 NOTE — H&P (Signed)
 Subjective: The patient is a 74 year old white male on whom another physician performed an L3-4 and L4-5 instrumentation and fusion years ago.  He has developed recurrent back and leg pain.  He failed medical management.  Was worked up with lumbar x-rays and lumbar MRI which demonstrated adjacent segment stenosis at L2-3.  I discussed the various treatment options with him.  He has decided to proceed with surgery.  Past Medical History:  Diagnosis Date   Anxiety    Arthritis    Diabetes mellitus    Diverticulosis    Headache    Hyperlipidemia    Hypertension    Neuropathy    feet and fingers   Peripheral vascular disease    Prostate cancer (HCC)    Sarcoidosis of central nervous system    negatively affects gait   Skin cancer    Multiple areas removed. Managed by Dr. Odis Molt.   Umbilical hernia     Past Surgical History:  Procedure Laterality Date   APPENDECTOMY     CATARACT EXTRACTION Bilateral    KNEE CARTILAGE SURGERY Bilateral 8031,8021   right and left knee   LAPAROSCOPIC APPENDECTOMY  2014   LYMPHADENECTOMY Bilateral 06/17/2018   Procedure: LYMPHADENECTOMY;  Surgeon: Alvaro Hummer, MD;  Location: [facility];  Service: Urology;  Laterality: Bilateral;   REVERSE SHOULDER ARTHROPLASTY Left 02/02/2019   Procedure: REVERSE SHOULDER ARTHROPLASTY;  Surgeon: Sharl Selinda Dover, MD;  Location: [facility] OR;  Service: Orthopedics;  Laterality: Left;  2.5hrs   ROBOT ASSISTED LAPAROSCOPIC RADICAL PROSTATECTOMY N/A 06/17/2018   Procedure: XI ROBOTIC ASSISTED LAPAROSCOPIC RADICAL PROSTATECTOMY;  Surgeon: Alvaro Hummer, MD;  Location: [facility];  Service: Urology;  Laterality: N/A;  3 HRS   TOTAL KNEE ARTHROPLASTY  01/30/2011   Procedure: TOTAL KNEE ARTHROPLASTY;  Surgeon: Tanda DELENA Heading, MD;  Location: [facility];  Service: Orthopedics;  Laterality: Right;   TOTAL KNEE ARTHROPLASTY Left 01/31/2016   Procedure: LEFT TOTAL KNEE ARTHROPLASTY;  Surgeon: Tanda Heading, MD;  Location: [facility];   Service: Orthopedics;  Laterality: Left;   TRANSFORAMINAL LUMBAR INTERBODY FUSION (TLIF) WITH PEDICLE SCREW FIXATION 2 LEVEL N/A 01/17/2022   Procedure: L2-3 L3-4 Open Laminectomy/TLIF/posterolateral fusion;  Surgeon: Cheryle Debby DELENA, MD;  Location: [facility];  Service: Neurosurgery;  Laterality: N/A;    Allergies[1]  Social History   Tobacco Use   Smoking status: Former    Current packs/day: 0.00    Average packs/day: 2.0 packs/day for 25.0 years (50.0 ttl pk-yrs)    Types: Cigarettes    Start date: 01/25/1956    Quit date: 01/24/1981    Years since quitting: 43.0   Smokeless tobacco: Never  Substance Use Topics   Alcohol  use: Yes    Alcohol /week: 7.0 standard drinks of alcohol     Types: 7 Cans of beer per week    Comment: maybe 1 a day or less    Family History  Problem Relation Age of Onset   Heart disease Father        Pacemaker   CVA Father    Alzheimer's disease Father    Neuropathy Father    Colon cancer Neg Hx    Breast cancer Neg Hx    Pancreatic cancer Neg Hx    Prostate cancer Neg Hx    Prior to Admission medications  Medication Sig Start Date End Date Taking? Authorizing Provider  acetaminophen  (TYLENOL ) 650 MG CR tablet Take 1,300 mg by mouth in the morning and at bedtime.   Yes [provider]  allopurinol  (ZYLOPRIM ) 300 MG tablet Take 300 mg by mouth 2 (two) times daily. 06/23/14  Yes [provider]  amLODipine  (NORVASC ) 2.5 MG tablet Take 2.5 mg by mouth daily. 11/08/19  Yes [provider]  amoxicillin (AMOXIL) 500 MG tablet Take 2,000 mg by mouth once.   Yes [provider]  Ascorbic Acid (VITAMIN C PO) Take 3,000 mg by mouth in the morning.   Yes [provider]  celecoxib  (CELEBREX ) 200 MG capsule Take 200 mg by mouth 2 (two) times daily.   Yes [provider]  Cholecalciferol (DIALYVITE VITAMIN D 5000) 125 MCG (5000 UT) capsule Take 5,000 Units by mouth at bedtime.   Yes [provider]   cilostazol  (PLETAL ) 100 MG tablet Take 100 mg by mouth at bedtime.   Yes [provider]  clobetasol (OLUX) 0.05 % topical foam Apply 1 application topically daily as needed (irritation).    Yes [provider]  Cyanocobalamin  (B-12) 3000 MCG CAPS Take 3,000 mcg by mouth at bedtime.   Yes [provider]  cycloSPORINE  (RESTASIS ) 0.05 % ophthalmic emulsion Place 1 drop into both eyes 2 (two) times daily. 12/04/23  Yes [provider]  diclofenac Sodium (VOLTAREN) 1 % GEL Apply 1 Application topically daily.   Yes [provider]  fluorometholone  (FML) 0.1 % ophthalmic suspension Place 1 drop into both eyes 2 (two) times daily. 03/08/21  Yes [provider]  gabapentin  (NEURONTIN ) 300 MG capsule Take 300 mg by mouth 2 (two) times daily.   Yes [provider]  ketoconazole (NIZORAL) 2 % shampoo Apply 1 Application topically 2 (two) times a week. 01/01/24  Yes [provider]  Multiple Vitamin (MULTIVITAMIN WITH MINERALS) TABS tablet Take 1 tablet by mouth in the morning. Centrum   Yes [provider]  olmesartan (BENICAR) 20 MG tablet Take 20 mg by mouth in the morning. 12/22/19  Yes [provider]  pravastatin  (PRAVACHOL ) 20 MG tablet Take 20 mg by mouth daily.   Yes [provider]  saccharomyces boulardii (FLORASTOR) 250 MG capsule Take 250 mg by mouth 2 (two) times daily.   Yes [provider]  tirzepatide CLOYDE) 10 MG/0.5ML Pen Inject 10 mg into the skin once a week.   Yes [provider]  Vibegron (GEMTESA) 75 MG TABS Take 75 mg by mouth at bedtime.   Yes [provider]  zinc  gluconate 50 MG tablet Take 50 mg by mouth at bedtime.   Yes [provider]  Alpha-Lipoic Acid 100 MG CAPS Take 2 a day on an empty stomach . Patient not taking: Reported on 01/13/2024 03/27/23   Dohmeier, Dedra, MD  colchicine 0.6 MG tablet Take 0.6 mg by mouth daily as needed (gout).     [provider]  Lancets (ONETOUCH DELICA PLUS [facility]) MISC SMARTSIG:Topical 2-4 Times Daily 10/27/19   [provider]  mupirocin  ointment (BACTROBAN ) 2 % Apply 1 Application topically daily. Patient taking differently: Apply 1 Application topically daily as needed (toe infection). 10/16/23   Silva Juliene SAUNDERS, DPM  ONE TOUCH ULTRA TEST test strip  10/21/17   [provider]  zolpidem  (AMBIEN ) 10 MG tablet Take 10 mg by mouth at bedtime as needed for sleep.  09/13/11   [provider]     Review of Systems  Positive ROS: As above  All other systems have been reviewed and were otherwise negative with the exception of those mentioned in the HPI and as above.  Objective:  Vital signs in last 24 hours: Temp:  [98.1 F (36.7 C)] 98.1 F (36.7 C) (01/15 0553) Pulse Rate:  [76] 76 (01/15 0553) Resp:  [20] 20 (01/15 0553) BP: (148)/(84) 148/84 (01/15 0553) SpO2:  [96 %] 96 % (01/15 0553) Weight:  [122.5 kg] 122.5 kg (01/15 0553) Estimated body mass index is 36.62 kg/m as calculated from the following:   Height as of this encounter: 6' (1.829 m).   Weight as of this encounter: 122.5 kg.   General Appearance: Alert Head: Normocephalic, without obvious abnormality, atraumatic Eyes: PERRL, conjunctiva/corneas clear, EOM's intact,    Ears: Normal  Throat: Normal  Neck: Supple, Back: His lumbar incision is well-healed.  Lungs: Clear to auscultation bilaterally, respirations unlabored Heart: Regular rate and rhythm, no murmur, rub or gallop Abdomen: Soft, non-tender Extremities: Extremities normal, atraumatic, no cyanosis or edema Skin: unremarkable  NEUROLOGIC:   Mental status: alert and oriented,Motor Exam - grossly normal Sensory Exam - grossly normal Reflexes:  Coordination - grossly normal Gait - grossly normal Balance - grossly normal Cranial Nerves: I: smell Not tested  II: visual acuity  OS: Normal  OD: Normal   II: visual fields  Full to confrontation  II: pupils Equal, round, reactive to light  III,VII: ptosis None  III,IV,VI: extraocular muscles  Full ROM  V: mastication Normal  V: facial light touch sensation  Normal  V,VII: corneal reflex  Present  VII: facial muscle function - upper  Normal  VII: facial muscle function - lower Normal  VIII: hearing Not tested  IX: soft palate elevation  Normal  IX,X: gag reflex Present  XI: trapezius strength  5/5  XI: sternocleidomastoid strength 5/5  XI: neck flexion strength  5/5  XII: tongue strength  Normal    Data Review Lab Results  Component Value Date   WBC 6.2 01/20/2024   HGB 11.7 (L) 01/20/2024   HCT 34.4 (L) 01/20/2024   MCV 101.5 (H) 01/20/2024   PLT 182 01/20/2024   Lab Results  Component Value Date   NA 139 01/20/2024   K 5.5 (H) 01/20/2024   CL 105 01/20/2024   CO2 25 01/20/2024   BUN 24 (H) 01/20/2024   CREATININE 1.30 (H) 01/20/2024   GLUCOSE 93 01/20/2024   Lab Results  Component Value Date   INR 0.99 01/26/2016    Assessment/Plan: Lumbar spinal stenosis, neurogenic claudication, lumbago, lumbar radiculopathy: I have discussed the situation with the patient.  I have reviewed his imaging studies with him and pointed out the abnormalities.  We have discussed the various treatment options including surgery.  I have described the surgical treatment option of an exploration of his lumbar fusion with an L2-3 decompression, instrumentation and fusion.  I have shown him surgical models.  I have given him a surgical pamphlet.  We have discussed the risk, benefits, alternatives, expected postoperative course, and likelihood of achieving her goals with surgery.  I have answered the patient's questions.  He has decided proceed with surgery.   Reyes JONETTA Budge 01/29/2024 7:23 AM         [1] No Known Allergies

## 2024-01-29 NOTE — Op Note (Signed)
 Brief history: The patient is a 74 year old white male whom another physician performed an L3-4 and L4-5 decompression, instrumentation and fusion.  He has developed persistent and worsening back and leg pain.  He failed medical management.  He was worked up with a lumbar MRI which demonstrated adjacent segment degenerative changes and stenosis at L2-3 .  I discussed the various treatment options with him.  He has decided to proceed with surgery.  Preoperative diagnosis: Lumbar degenerative disc disease, lumbar spinal stenosis compressing both the L2 and the L3 nerve roots; lumbago; lumbar radiculopathy; neurogenic claudication  Postoperative diagnosis: The same and lumbar pseudoarthrosis  Procedure: Bilateral L2-3 laminotomy/foraminotomies/medial facetectomy to decompress the bilateral L2 and L3 nerve roots(the work required to do this was in addition to the work required to do the posterior lumbar interbody fusion because of the patient's spinal stenosis, facet arthropathy. Etc. requiring a wide decompression of the nerve roots.); L2-3 posterior lumbar interbody fusion with local morselized autograft bone and Medtronic DBM; insertion of interbody prosthesis at L2-3 (Medtronic expandable interbody prosthesis); posterior mental instrumentation from L2 to L5 with globus titanium pedicle screws and rods; posterior lateral arthrodesis at L2-3 and redo L3-4 posterolateral arthrodesis with local morselized autograft bone, Trinity bone graft extender, and Medtronic DBM; exploration of lumbar fusion/revision of lumbar hardware  Surgeon: Dr. Chyrl Budge  Asst.: Duwaine Beck, NP  Anesthesia: Gen. endotracheal  Estimated blood loss: 250 cc  Drains: Medium Hemovac drain in the epidural space  Complications: None  Description of procedure: The patient was brought to the operating room by the anesthesia team. General endotracheal anesthesia was induced. The patient was turned to the prone position on the  Wilson frame. The patient's lumbosacral region was then prepared with Betadine scrub and Betadine solution. Sterile drapes were applied.  I then injected the area to be incised with Marcaine  with epinephrine  solution. I then used the scalpel to make a linear midline incision over the L2-3, L3-4 and L4-5 interspace. I then used electrocautery to perform a bilateral subperiosteal dissection exposing the spinous process and lamina of L2-3, L3-4 and L4-5. We then inserted the Verstrac retractor to provide exposure.  We explored the fusion by removing the caps from the old screws and then removed the rod.  The left L3 pedicle screw was a bit cephalad and was also loose indicative of a pseudoarthrosis at L3-4.  I removed that screw.  I began the decompression by using the high speed drill to perform laminotomies at L2-3 bilaterally. We then used the Kerrison punches to widen the laminotomy and removed the ligamentum flavum at L2-3 bilaterally.. We used the Kerrison punches to remove the facets at L2-3 bilaterally. We performed wide foraminotomies about the bilateral L2 and L3 nerve roots completing the decompression.  We now turned our attention to the posterior lumbar interbody fusion. I used a scalpel to incise the intervertebral disc at L2-3 bilaterally. I then performed a partial intervertebral discectomy at L2-3 bilaterally using the pituitary forceps. We prepared the vertebral endplates at T3 bilaterally for the fusion by removing the soft tissues with the curettes. We then used the trial spacers to pick the appropriate sized interbody prosthesis. We prefilled his prosthesis with a combination of local morselized autograft bone that we obtained during the decompression as well as Zimmer DBM. We inserted the prefilled prosthesis into the interspace at C3, we then expanded the prosthesis. There was a good snug fit of the prosthesis in the interspace. We then filled and the remainder of  the intervertebral disc  space with local morselized autograft bone and Medtronic DBM. This completed the posterior lumbar interbody arthrodesis.  During the decompression and insertion of the prosthesis the assistant protected the thecal sac and nerve roots with the D'Errico retractor.  We now turned attention to the instrumentation. Under fluoroscopic guidance we cannulated the bilateral L2 and left L3 pedicles with the bone probe. We then removed the bone probe. We then tapped the pedicle with a 6.5 millimeter tap. We then removed the tap. We probed inside the tapped pedicle with a ball probe to rule out cortical breaches. We then inserted a 7.5 x 55 millimeter pedicle screw into the L2 pedicles bilaterally and the left L3 pedicle under fluoroscopic guidance. We then palpated along the medial aspect of the pedicles to rule out cortical breaches. There were none. The nerve roots were not injured. We then connected the unilateral pedicle screws with a lordotic rod. We compressed the construct and secured the rod in place with the caps. We then tightened the caps appropriately. This completed the instrumentation from L2-L5 bilaterally.  We now turned our attention to the posterior lateral arthrodesis at L2-3, and the redo arthrodesis at L3-4.  We used electrocautery to clear the soft tissue from the L2-3 facets.  We used the high-speed drill to decorticate the remainder of the facets, pars, transverse process at T3 and L3-4 bilaterally. We then applied a combination of local morselized autograft bone, Trinity bone graft extender, and Tronic DBM over these decorticated posterior lateral structures. This completed the posterior lateral arthrodesis.  We then obtained hemostasis using bipolar electrocautery. We irrigated the wound out with vashe solution. We inspected the thecal sac and nerve roots and noted they were well decompressed. We then removed the retractor.  We injected Exparel  .  We placed a medium Hemovac drain in the epidural  space and tunneled it out through a separate stab wound.  We reapproximated patient's thoracolumbar fascia with interrupted #1 Vicryl suture. We reapproximated patient's subcutaneous tissue with interrupted 2-0 Vicryl suture. The reapproximated patient's skin with Steri-Strips and benzoin. The wound was then coated with bacitracin  ointment. A sterile dressing was applied. The drapes were removed. The patient was subsequently returned to the supine position where they were extubated by the anesthesia team. He was then transported to the post anesthesia care unit in stable condition. All sponge instrument and needle counts were reportedly correct at the end of this case.

## 2024-01-29 NOTE — Anesthesia Postprocedure Evaluation (Signed)
"   Anesthesia Post Note  Patient: [name]  Procedure(s) Performed: POSTERIOR LUMBAR FUSION LUMBAR TWO-THREE; EXPLORATION LUMBAR THREE-FOUR, FOUR-FIVE     Patient location during evaluation: PACU Anesthesia Type: General Level of consciousness: awake and alert, oriented and patient cooperative Pain management: pain level controlled Vital Signs Assessment: post-procedure vital signs reviewed and stable Respiratory status: spontaneous breathing, nonlabored ventilation, respiratory function stable and patient connected to nasal cannula oxygen Cardiovascular status: blood pressure returned to baseline and stable Postop Assessment: no apparent nausea or vomiting Anesthetic complications: no   No notable events documented.  Last Vitals:  Vitals:   01/29/24 1330 01/29/24 1400  BP: 128/85 112/64  Pulse: 82 81  Resp: 20 13  Temp: 36.7 C   SpO2: 98% 97%    Last Pain:  Vitals:   01/29/24 1330  TempSrc:   PainSc: 0-No pain                 Franshesca Chipman,E. Tauren Delbuono      "

## 2024-01-29 NOTE — Progress Notes (Signed)
 Orthopedic Tech Progress Note Patient Details:  Raymond Herring 08-06-1950 [id number]  Ortho Devices Type of Ortho Device: Lumbar corsett Ortho Device/Splint Location: Back Ortho Device/Splint Interventions: Adjustment, Ordered   Post Interventions Patient Tolerated: Well  Tanairy Payeur A Kiah Vanalstine 01/29/2024, 4:08 PM

## 2024-01-29 NOTE — Transfer of Care (Signed)
 Immediate Anesthesia Transfer of Care Note  Patient: Raymond Herring  Procedure(s) Performed: POSTERIOR LUMBAR FUSION LUMBAR TWO-THREE; EXPLORATION LUMBAR THREE-FOUR, FOUR-FIVE  Patient Location: PACU  Anesthesia Type:General  Level of Consciousness: awake, drowsy, patient cooperative, and responds to stimulation  Airway & Oxygen Therapy: Patient Spontanous Breathing and Patient connected to face mask oxygen  Post-op Assessment: Report given to RN, Post -op Vital signs reviewed and stable, and Patient moving all extremities X 4  Post vital signs: Reviewed and stable  Last Vitals:  Vitals Value Taken Time  BP 116/63 01/29/24 12:30  Temp    Pulse 76 01/29/24 12:30  Resp 19 01/29/24 12:30  SpO2 100 % 01/29/24 12:30  Vitals shown include unfiled device data.  Last Pain:  Vitals:   01/29/24 0620  TempSrc:   PainSc: 0-No pain         Complications: No notable events documented.

## 2024-01-30 DIAGNOSIS — M48062 Spinal stenosis, lumbar region with neurogenic claudication: Secondary | ICD-10-CM | POA: Diagnosis not present

## 2024-01-30 LAB — GLUCOSE, CAPILLARY: Glucose-Capillary: 114 mg/dL — ABNORMAL HIGH (ref 70–99)

## 2024-01-30 MED ORDER — CYCLOBENZAPRINE HCL 5 MG PO TABS: 5.0000 mg | ORAL_TABLET | Freq: Three times a day (TID) | ORAL | Status: DC | PRN

## 2024-01-30 MED ORDER — CYCLOBENZAPRINE HCL 5 MG PO TABS: 5.0000 mg | ORAL_TABLET | Freq: Three times a day (TID) | ORAL | 0 refills | Status: AC | PRN

## 2024-01-30 MED ORDER — OXYCODONE-ACETAMINOPHEN 5-325 MG PO TABS
1.0000 | ORAL_TABLET | ORAL | Status: DC | PRN
  Administered 2024-01-30: 2 via ORAL
  Filled 2024-01-30: qty 2

## 2024-01-30 MED ORDER — DOCUSATE SODIUM 100 MG PO CAPS: 100.0000 mg | ORAL_CAPSULE | Freq: Two times a day (BID) | ORAL | 0 refills | Status: AC

## 2024-01-30 MED ORDER — OXYCODONE-ACETAMINOPHEN 5-325 MG PO TABS: 1.0000 | ORAL_TABLET | ORAL | 0 refills | Status: AC | PRN

## 2024-01-30 MED FILL — Thrombin For Soln 5000 Unit: CUTANEOUS | Qty: 5000 | Status: AC

## 2024-01-30 NOTE — Discharge Summary (Signed)
 Physician Discharge Summary  Patient ID: Raymond Herring MRN: [id number] DOB/AGE: Apr 07, 1950 74 y.o.  Admit date: 01/29/2024 Discharge date: 01/30/2024  Admission Diagnoses: Lumbar spinal stasis, lumbar spinal stenosis, lumbago, lumbar radiculopathy, neurogenic claudication  Discharge Diagnoses: The same and pseudoarthrosis Principal Problem:   Spondylolisthesis of lumbar region   Discharged Condition: good  Hospital Course: I performed an L2-3 decompression, instrumentation and fusion on the patient on 01/29/2024.  The surgery went well.  The patient's postoperative course was unremarkable.  On Po postoperative day #1 the patient felt well and requested discharge home.  He was given verbal and written discharge instructions.  All his questions were answered.  Consults: PT, care management Significant Diagnostic Studies: None Treatments: Exploration of lumbar fusion/revision of lumbar hardware; L2-3 decompression, instrumentation and fusion, L3-4 redo posterior lateral arthrodesis Discharge Exam: Blood pressure 126/78, pulse 83, temperature 98.9 F (37.2 C), temperature source Oral, resp. rate 20, height 6' (1.829 m), weight 122.5 kg, SpO2 99%. The patient is alert and pleasant.  He looks well.  His dressing is clean and dry.  His strength is normal.  Disposition: Home  Discharge Instructions     Call MD for:  difficulty breathing, headache or visual disturbances   Complete by: As directed    Call MD for:  extreme fatigue   Complete by: As directed    Call MD for:  hives   Complete by: As directed    Call MD for:  persistant dizziness or light-headedness   Complete by: As directed    Call MD for:  persistant nausea and vomiting   Complete by: As directed    Call MD for:  redness, tenderness, or signs of infection (pain, swelling, redness, odor or green/yellow discharge around incision site)   Complete by: As directed    Call MD for:  severe uncontrolled pain   Complete by:  As directed    Call MD for:  temperature >100.4   Complete by: As directed    Discharge instructions   Complete by: As directed    Call [phone] for a followup appointment. Take a stool softener while you are using pain medications.   Driving Restrictions   Complete by: As directed    Do not drive for 2 weeks.   Increase activity slowly   Complete by: As directed    Lifting restrictions   Complete by: As directed    Do not lift more than 5 pounds. No excessive bending or twisting.   May shower / Bathe   Complete by: As directed    Remove the dressing for 3 days after surgery.  You may shower, but leave the incision alone.   Remove dressing in 48 hours   Complete by: As directed       Allergies as of 01/30/2024   No Known Allergies      Medication List     STOP taking these medications    acetaminophen  650 MG CR tablet Commonly known as: TYLENOL    celecoxib  200 MG capsule Commonly known as: CELEBREX    zolpidem  10 MG tablet Commonly known as: AMBIEN        TAKE these medications    allopurinol  300 MG tablet Commonly known as: ZYLOPRIM  Take 300 mg by mouth 2 (two) times daily.   Alpha-Lipoic Acid 100 MG Caps Take 2 a day on an empty stomach .   amLODipine  2.5 MG tablet Commonly known as: NORVASC  Take 2.5 mg by mouth daily.   amoxicillin 500 MG tablet Commonly  known as: AMOXIL Take 2,000 mg by mouth once.   B-12 3000 MCG Caps Take 3,000 mcg by mouth at bedtime.   cilostazol  100 MG tablet Commonly known as: PLETAL  Take 100 mg by mouth at bedtime.   clobetasol 0.05 % topical foam Commonly known as: OLUX Apply 1 application topically daily as needed (irritation).   colchicine 0.6 MG tablet Take 0.6 mg by mouth daily as needed (gout).   cyclobenzaprine  5 MG tablet Commonly known as: FLEXERIL  Take 1 tablet (5 mg total) by mouth 3 (three) times daily as needed for muscle spasms.   cycloSPORINE  0.05 % ophthalmic emulsion Commonly known as:  RESTASIS  Place 1 drop into both eyes 2 (two) times daily.   Dialyvite Vitamin D 5000 125 MCG (5000 UT) capsule Generic drug: Cholecalciferol Take 5,000 Units by mouth at bedtime.   diclofenac Sodium 1 % Gel Commonly known as: VOLTAREN Apply 1 Application topically daily.   docusate sodium  100 MG capsule Commonly known as: COLACE Take 1 capsule (100 mg total) by mouth 2 (two) times daily.   fluorometholone  0.1 % ophthalmic suspension Commonly known as: FML Place 1 drop into both eyes 2 (two) times daily.   gabapentin  300 MG capsule Commonly known as: NEURONTIN  Take 300 mg by mouth 2 (two) times daily.   Gemtesa 75 MG Tabs Generic drug: Vibegron Take 75 mg by mouth at bedtime.   ketoconazole 2 % shampoo Commonly known as: NIZORAL Apply 1 Application topically 2 (two) times a week.   Mounjaro 10 MG/0.5ML Pen Generic drug: tirzepatide Inject 10 mg into the skin once a week.   multivitamin with minerals Tabs tablet Take 1 tablet by mouth in the morning. Centrum   mupirocin  ointment 2 % Commonly known as: BACTROBAN  Apply 1 Application topically daily. What changed:  when to take this reasons to take this   olmesartan 20 MG tablet Commonly known as: BENICAR Take 20 mg by mouth in the morning.   ONE TOUCH ULTRA TEST test strip Generic drug: glucose blood   OneTouch Delica Plus Lancet33G Misc SMARTSIG:Topical 2-4 Times Daily   oxyCODONE -acetaminophen  5-325 MG tablet Commonly known as: PERCOCET/ROXICET Take 1-2 tablets by mouth every 4 (four) hours as needed for moderate pain (pain score 4-6).   pravastatin  20 MG tablet Commonly known as: PRAVACHOL  Take 20 mg by mouth daily.   saccharomyces boulardii 250 MG capsule Commonly known as: FLORASTOR Take 250 mg by mouth 2 (two) times daily.   VITAMIN C PO Take 3,000 mg by mouth in the morning.   zinc  gluconate 50 MG tablet Take 50 mg by mouth at bedtime.         Signed: Reyes JONETTA Budge 01/30/2024, 7:50  AM

## 2024-01-30 NOTE — Evaluation (Signed)
 Physical Therapy Evaluation Patient Details Name: Raymond Herring MRN: [id number] DOB: 23-Jan-1950 Today's Date: 01/30/2024  History of Present Illness  Pt is a 74 y.o. male s/p PLIF. PMH: previous back sx, HLD, HTN, neuropathy, prostate ca, sarcoidosis of CNS  Clinical Impression  PT eval complete. Pt demo mod I bed mobility. Supervision transfer, supervision amb 500' with RW, and supervision ascend/descend 12 steps with 1 rail. All education complete. Pt lives with wife who can provide 24/7 supervision. All education complete. Plan is for d/c home today. No further skilled PT intervention indicated. Recommend RW for home. PT signing off.        If plan is discharge home, recommend the following: A little help with bathing/dressing/bathroom;Assistance with cooking/housework;Assist for transportation;Help with stairs or ramp for entrance   Can travel by private vehicle        Equipment Recommendations Rolling walker (2 wheels)  Recommendations for Other Services       Functional Status Assessment Patient has had a recent decline in their functional status and demonstrates the ability to make significant improvements in function in a reasonable and predictable amount of time.     Precautions / Restrictions Precautions Precautions: Back Recall of Precautions/Restrictions: Intact Precaution/Restrictions Comments: Educated on 3/3 back precautions. Handout provided. Required Braces or Orthoses: Spinal Brace Spinal Brace: Lumbar corset;Applied in sitting position      Mobility  Bed Mobility Overal bed mobility: Modified Independent                  Transfers Overall transfer level: Needs assistance Equipment used: Ambulation equipment used Transfers: Sit to/from Stand Sit to Stand: Supervision                Ambulation/Gait Ambulation/Gait assistance: Supervision Gait Distance (Feet): 500 Feet Assistive device: Rolling walker (2 wheels) Gait  Pattern/deviations: Step-through pattern, Decreased stride length Gait velocity: WFL Gait velocity interpretation: >2.62 ft/sec, indicative of community ambulatory   General Gait Details: steady gait with RW  Stairs Stairs: Yes Stairs assistance: Supervision Stair Management: One rail Right, One rail Left, Forwards, Step to pattern Number of Stairs: 12    Wheelchair Mobility     Tilt Bed    Modified Rankin (Stroke Patients Only)       Balance Overall balance assessment: Mild deficits observed, not formally tested                                           Pertinent Vitals/Pain Pain Assessment Pain Assessment: Faces Faces Pain Scale: Hurts little more Pain Location: back Pain Descriptors / Indicators: Sore, Discomfort Pain Intervention(s): Monitored during session    Home Living Family/patient expects to be discharged to:: Private residence Living Arrangements: Spouse/significant other Available Help at Discharge: Family;Available 24 hours/day Type of Home: House Home Access: Stairs to enter Entrance Stairs-Rails: Right;Left;Can reach both Entrance Stairs-Number of Steps: 2 Alternate Level Stairs-Number of Steps: flight Home Layout: Two level;Bed/bath upstairs Home Equipment: Cane - single point;Shower seat - built in;Grab bars - tub/shower;Grab bars - toilet      Prior Function Prior Level of Function : Independent/Modified Independent;Driving                     Extremity/Trunk Assessment   Upper Extremity Assessment Upper Extremity Assessment: Overall WFL for tasks assessed    Lower Extremity Assessment Lower Extremity Assessment: Overall WFL for tasks  assessed    Cervical / Trunk Assessment Cervical / Trunk Assessment: Back Surgery  Communication   Communication Communication: No apparent difficulties    Cognition Arousal: Alert Behavior During Therapy: WFL for tasks assessed/performed   PT - Cognitive impairments: No  apparent impairments                         Following commands: Intact       Cueing Cueing Techniques: Verbal cues     General Comments      Exercises     Assessment/Plan    PT Assessment Patient does not need any further PT services  PT Problem List         PT Treatment Interventions      PT Goals (Current goals can be found in the Care Plan section)  Acute Rehab PT Goals Patient Stated Goal: home today PT Goal Formulation: All assessment and education complete, DC therapy    Frequency       Co-evaluation               AM-PAC PT 6 Clicks Mobility  Outcome Measure Help needed turning from your back to your side while in a flat bed without using bedrails?: None Help needed moving from lying on your back to sitting on the side of a flat bed without using bedrails?: None Help needed moving to and from a bed to a chair (including a wheelchair)?: A Little Help needed standing up from a chair using your arms (e.g., wheelchair or bedside chair)?: A Little Help needed to walk in hospital room?: A Little Help needed climbing 3-5 steps with a railing? : A Little 6 Click Score: 20    End of Session Equipment Utilized During Treatment: Gait belt;Back brace Activity Tolerance: Patient tolerated treatment well Patient left: in bed;with call bell/phone within reach Nurse Communication: Mobility status PT Visit Diagnosis: Difficulty in walking, not elsewhere classified (R26.2)    Time: 9184-9157 PT Time Calculation (min) (ACUTE ONLY): 27 min   Charges:   PT Evaluation $PT Eval Moderate Complexity: 1 Mod   PT General Charges $$ ACUTE PT VISIT: 1 Visit         Sari MATSU., PT  Office # [phone]   [name] 01/30/2024, 9:11 AM

## 2024-01-30 NOTE — Care Management Obs Status (Signed)
 MEDICARE OBSERVATION STATUS NOTIFICATION   Patient Details  Name: RUSSEL MORAIN MRN: [id number] Date of Birth: 1950/05/25   Medicare Observation Status Notification Given:  Yes    Jennie Laneta Dragon 01/30/2024, 9:22 AM

## 2024-01-30 NOTE — Progress Notes (Signed)
 Patient alert and oriented, mae's well, voiding adequate amount of urine, swallowing without difficulty,  c/o pain at time of discharge, and pain medication given prior to discharge. Patient discharged home with family Script and discharged instructions given to patient. Patient and family stated understanding of instructions given. Room was checked and accounted for all patient's belongings; discharge instructions concerning his medications, incision care, follow up appointment and when to call the doctor as needed were all discussed with patient by RN and he expressed understanding on the instructions given.

## 2024-01-30 NOTE — Plan of Care (Signed)
" °  Problem: Education: Goal: Knowledge of General Education information will improve Description: Including pain rating scale, medication(s)/side effects and non-pharmacologic comfort measures Outcome: Completed/Met   Problem: Health Behavior/Discharge Planning: Goal: Ability to manage health-related needs will improve Outcome: Completed/Met   Problem: Clinical Measurements: Goal: Ability to maintain clinical measurements within normal limits will improve Outcome: Completed/Met Goal: Will remain free from infection Outcome: Completed/Met Goal: Diagnostic test results will improve Outcome: Completed/Met Goal: Respiratory complications will improve Outcome: Completed/Met Goal: Cardiovascular complication will be avoided Outcome: Completed/Met   Problem: Activity: Goal: Risk for activity intolerance will decrease Outcome: Completed/Met   Problem: Nutrition: Goal: Adequate nutrition will be maintained Outcome: Completed/Met   Problem: Coping: Goal: Level of anxiety will decrease Outcome: Completed/Met   Problem: Elimination: Goal: Will not experience complications related to bowel motility Outcome: Completed/Met Goal: Will not experience complications related to urinary retention Outcome: Completed/Met   Problem: Pain Managment: Goal: General experience of comfort will improve and/or be controlled Outcome: Completed/Met   Problem: Safety: Goal: Ability to remain free from injury will improve Outcome: Completed/Met   Problem: Skin Integrity: Goal: Risk for impaired skin integrity will decrease Outcome: Completed/Met   Problem: Education: Goal: Knowledge of the prescribed therapeutic regimen will improve Outcome: Completed/Met   Problem: Bowel/Gastric: Goal: Gastrointestinal status for postoperative course will improve Outcome: Completed/Met   Problem: Cardiac: Goal: Ability to maintain an adequate cardiac output Outcome: Completed/Met Goal: Will show no  evidence of cardiac arrhythmias Outcome: Completed/Met   Problem: Nutritional: Goal: Will attain and maintain optimal nutritional status Outcome: Completed/Met   Problem: Neurological: Goal: Will regain or maintain usual level of consciousness Outcome: Completed/Met   Problem: Clinical Measurements: Goal: Ability to maintain clinical measurements within normal limits Outcome: Completed/Met Goal: Postoperative complications will be avoided or minimized Outcome: Completed/Met   Problem: Respiratory: Goal: Will regain and/or maintain adequate ventilation Outcome: Completed/Met Goal: Respiratory status will improve Outcome: Completed/Met   Problem: Skin Integrity: Goal: Demonstrates signs of wound healing without infection Outcome: Completed/Met   Problem: Urinary Elimination: Goal: Will remain free from infection Outcome: Completed/Met Goal: Ability to achieve and maintain adequate urine output Outcome: Completed/Met   Problem: Education: Goal: Ability to verbalize activity precautions or restrictions will improve Outcome: Completed/Met Goal: Knowledge of the prescribed therapeutic regimen will improve Outcome: Completed/Met Goal: Understanding of discharge needs will improve Outcome: Completed/Met   Problem: Activity: Goal: Ability to avoid complications of mobility impairment will improve Outcome: Completed/Met Goal: Ability to tolerate increased activity will improve Outcome: Completed/Met Goal: Will remain free from falls Outcome: Completed/Met   Problem: Bowel/Gastric: Goal: Gastrointestinal status for postoperative course will improve Outcome: Completed/Met   Problem: Clinical Measurements: Goal: Ability to maintain clinical measurements within normal limits will improve Outcome: Completed/Met Goal: Postoperative complications will be avoided or minimized Outcome: Completed/Met Goal: Diagnostic test results will improve Outcome: Completed/Met   Problem:  Pain Management: Goal: Pain level will decrease Outcome: Completed/Met   Problem: Skin Integrity: Goal: Will show signs of wound healing Outcome: Completed/Met   Problem: Health Behavior/Discharge Planning: Goal: Identification of resources available to assist in meeting health care needs will improve Outcome: Completed/Met   Problem: Bladder/Genitourinary: Goal: Urinary functional status for postoperative course will improve Outcome: Completed/Met   "

## 2024-02-04 MED FILL — Sodium Chloride IV Soln 0.9%: INTRAVENOUS | Qty: 1000 | Status: AC

## 2024-02-04 MED FILL — Heparin Sodium (Porcine) Inj 1000 Unit/ML: INTRAMUSCULAR | Qty: 30 | Status: AC

## 2024-02-17 ENCOUNTER — Ambulatory Visit

## 2024-02-17 DIAGNOSIS — E1140 Type 2 diabetes mellitus with diabetic neuropathy, unspecified: Secondary | ICD-10-CM

## 2024-02-18 NOTE — Progress Notes (Signed)
 "  Subjective:  Patient ID: Raymond Herring, male    DOB: February 22, 1950,  MRN: [id number]  No chief complaint on file.   Discussed the use of AI scribe software for clinical note transcription with the patient, who gave verbal consent to proceed.  History of Present Illness Raymond Herring is a 74 year old male with chronic left foot ulcer and hammertoe deformity who presents for follow-up of ongoing left foot symptoms.  He has persistent curling and curving of the left toes with a mild rubbing area, which is not painful and allows him to wear boots and crocs without difficulty. He keeps the area clean and uses topical antibiotic and ointment as needed. He avoids sand and debris in his shoes after prior irritation at the toe tip from beach exposure. He is concerned about maintaining balance.  He remains physically active with outdoor chores, including snow and ice removal. He had lumbar spine surgery with rod extension and screw removal less than three weeks ago and reports improved back symptoms with only muscular tightness. He is not currently limited by his foot or back.     Review of Systems: Negative except as noted in the HPI. Denies N/V/F/Ch.  Past Medical History:  Diagnosis Date   Anxiety    Arthritis    Diabetes mellitus    Diverticulosis    Headache    Hyperlipidemia    Hypertension    Neuropathy    feet and fingers   Peripheral vascular disease    Prostate cancer (HCC)    Sarcoidosis of central nervous system    negatively affects gait   Skin cancer    Multiple areas removed. Managed by Dr. Odis Molt.   Umbilical hernia    Current Medications[1]  Tobacco Use History[2]  Allergies[3] Objective:  Physical Exam: warm, good capillary refill, normal DP and PT pulses, and diffuse polyneuropathy  - previous partial right third toe amputation, today the right second toe ulcer has fully healed the right fourth toe ulcer is nearly fully healed without signs of infection.   Right second toe straight without significant deformity.   - Right fourth toe s/p flexor tendon transfer has decreased hammering/pressure at distal tuft of digit. Decrease in hyperkeratotic tissue without any underlying wound right fourth toe. Incision site fully healed. No complication or sign of infection.    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  There is moderate collapse of the medial longitudinal arch.  There are not underlying bone prominences.  He does experience some pain in his ankle that he has braces for.  - Pinpoint ulceration on lateral aspect of left fourth toe. Granular base without deep extension. Caused by rubbing of the 5th digit against it. Mild adductovarus deformity left 5th digit.  No erythema, edema, purulence, malodor, or other sign of infection.        Assessment:   1. Diabetic neuropathy, painful (HCC)      Plan:  Patient was evaluated and treated and all questions answered.  Assessment and Plan Assessment & Plan Chronic ulcer of left toe.  Hammertoe of left foot - Advised foot hygiene and continued use of topical antibiotic ointment as needed. - Recommended avoidance of sand and debris in footwear. - Instructed to monitor for signs of infection and report any concerning symptoms. - Discussed surgical intervention as future option if symptoms worsen. Would likely consider syndactylization with exostectomy of prominent bone.   - Advised continuation of conservative management and monitoring for  symptom changes. - Follow-up in five weeks for reassessment.       Return in about 5 weeks (around 03/23/2024).   Prentice Ovens, DPM AACFAS Fellowship Trained Podiatric Surgeon [facility]     [1]  Current Outpatient Medications:    allopurinol  (ZYLOPRIM ) 300 MG tablet, Take 300 mg by mouth 2 (two) times daily., Disp: , Rfl: 12   Alpha-Lipoic Acid 100 MG CAPS, Take 2 a day on an empty stomach . (Patient not taking:  Reported on 01/13/2024), Disp: 60 capsule, Rfl: 5   amLODipine  (NORVASC ) 2.5 MG tablet, Take 2.5 mg by mouth daily., Disp: , Rfl:    amoxicillin (AMOXIL) 500 MG tablet, Take 2,000 mg by mouth once., Disp: , Rfl:    Ascorbic Acid (VITAMIN C PO), Take 3,000 mg by mouth in the morning., Disp: , Rfl:    Cholecalciferol (DIALYVITE VITAMIN D 5000) 125 MCG (5000 UT) capsule, Take 5,000 Units by mouth at bedtime., Disp: , Rfl:    cilostazol  (PLETAL ) 100 MG tablet, Take 100 mg by mouth at bedtime., Disp: , Rfl:    clobetasol (OLUX) 0.05 % topical foam, Apply 1 application topically daily as needed (irritation). , Disp: , Rfl:    colchicine 0.6 MG tablet, Take 0.6 mg by mouth daily as needed (gout)., Disp: , Rfl:    Cyanocobalamin  (B-12) 3000 MCG CAPS, Take 3,000 mcg by mouth at bedtime., Disp: , Rfl:    cyclobenzaprine  (FLEXERIL ) 5 MG tablet, Take 1 tablet (5 mg total) by mouth 3 (three) times daily as needed for muscle spasms., Disp: 30 tablet, Rfl: 0   cycloSPORINE  (RESTASIS ) 0.05 % ophthalmic emulsion, Place 1 drop into both eyes 2 (two) times daily., Disp: , Rfl:    diclofenac Sodium (VOLTAREN) 1 % GEL, Apply 1 Application topically daily., Disp: , Rfl:    docusate sodium  (COLACE) 100 MG capsule, Take 1 capsule (100 mg total) by mouth 2 (two) times daily., Disp: 30 capsule, Rfl: 0   fluorometholone  (FML) 0.1 % ophthalmic suspension, Place 1 drop into both eyes 2 (two) times daily., Disp: , Rfl:    gabapentin  (NEURONTIN ) 300 MG capsule, Take 300 mg by mouth 2 (two) times daily., Disp: , Rfl:    ketoconazole (NIZORAL) 2 % shampoo, Apply 1 Application topically 2 (two) times a week., Disp: , Rfl:    Lancets (ONETOUCH DELICA PLUS LANCET33G) MISC, SMARTSIG:Topical 2-4 Times Daily, Disp: , Rfl:    Multiple Vitamin (MULTIVITAMIN WITH MINERALS) TABS tablet, Take 1 tablet by mouth in the morning. Centrum, Disp: , Rfl:    mupirocin  ointment (BACTROBAN ) 2 %, Apply 1 Application topically daily. (Patient taking  differently: Apply 1 Application topically daily as needed (toe infection).), Disp: 30 g, Rfl: 2   olmesartan (BENICAR) 20 MG tablet, Take 20 mg by mouth in the morning., Disp: , Rfl:    ONE TOUCH ULTRA TEST test strip, , Disp: , Rfl:    oxyCODONE -acetaminophen  (PERCOCET/ROXICET) 5-325 MG tablet, Take 1-2 tablets by mouth every 4 (four) hours as needed for moderate pain (pain score 4-6)., Disp: 40 tablet, Rfl: 0   pravastatin  (PRAVACHOL ) 20 MG tablet, Take 20 mg by mouth daily., Disp: , Rfl:    saccharomyces boulardii (FLORASTOR) 250 MG capsule, Take 250 mg by mouth 2 (two) times daily., Disp: , Rfl:    tirzepatide (MOUNJARO) 10 MG/0.5ML Pen, Inject 10 mg into the skin once a week., Disp: , Rfl:    Vibegron (GEMTESA) 75 MG TABS, Take 75 mg by  mouth at bedtime., Disp: , Rfl:    zinc  gluconate 50 MG tablet, Take 50 mg by mouth at bedtime., Disp: , Rfl:  [2]  Social History Tobacco Use  Smoking Status Former   Current packs/day: 0.00   Average packs/day: 2.0 packs/day for 25.0 years (50.0 ttl pk-yrs)   Types: Cigarettes   Start date: 01/25/1956   Quit date: 01/24/1981   Years since quitting: 43.0  Smokeless Tobacco Never  [3] No Known Allergies  "

## 2024-03-23 ENCOUNTER — Ambulatory Visit

## 2024-04-19 ENCOUNTER — Ambulatory Visit: Admitting: Podiatry
